# Patient Record
Sex: Female | Born: 1935 | Race: White | Hispanic: No | Smoking: Never smoker
Health system: Southern US, Community
[De-identification: ages and names within clinical notes are randomized; demographics above are authoritative.]

## PROBLEM LIST (undated history)

## (undated) DIAGNOSIS — F329 Major depressive disorder, single episode, unspecified: Secondary | ICD-10-CM

## (undated) DIAGNOSIS — E785 Hyperlipidemia, unspecified: Secondary | ICD-10-CM

## (undated) DIAGNOSIS — R001 Bradycardia, unspecified: Secondary | ICD-10-CM

## (undated) DIAGNOSIS — R42 Dizziness and giddiness: Secondary | ICD-10-CM

## (undated) DIAGNOSIS — Z853 Personal history of malignant neoplasm of breast: Secondary | ICD-10-CM

## (undated) DIAGNOSIS — I639 Cerebral infarction, unspecified: Secondary | ICD-10-CM

## (undated) DIAGNOSIS — Z9889 Other specified postprocedural states: Secondary | ICD-10-CM

## (undated) DIAGNOSIS — Z8489 Family history of other specified conditions: Secondary | ICD-10-CM

## (undated) DIAGNOSIS — E871 Hypo-osmolality and hyponatremia: Secondary | ICD-10-CM

## (undated) DIAGNOSIS — I471 Supraventricular tachycardia: Secondary | ICD-10-CM

## (undated) DIAGNOSIS — I495 Sick sinus syndrome: Secondary | ICD-10-CM

## (undated) DIAGNOSIS — R0602 Shortness of breath: Secondary | ICD-10-CM

## (undated) DIAGNOSIS — R011 Cardiac murmur, unspecified: Secondary | ICD-10-CM

## (undated) DIAGNOSIS — C801 Malignant (primary) neoplasm, unspecified: Secondary | ICD-10-CM

## (undated) DIAGNOSIS — E049 Nontoxic goiter, unspecified: Secondary | ICD-10-CM

## (undated) DIAGNOSIS — I4719 Other supraventricular tachycardia: Secondary | ICD-10-CM

## (undated) DIAGNOSIS — F32A Depression, unspecified: Secondary | ICD-10-CM

## (undated) DIAGNOSIS — G250 Essential tremor: Secondary | ICD-10-CM

## (undated) DIAGNOSIS — K625 Hemorrhage of anus and rectum: Secondary | ICD-10-CM

## (undated) DIAGNOSIS — C50919 Malignant neoplasm of unspecified site of unspecified female breast: Secondary | ICD-10-CM

## (undated) DIAGNOSIS — Z950 Presence of cardiac pacemaker: Secondary | ICD-10-CM

## (undated) DIAGNOSIS — M1990 Unspecified osteoarthritis, unspecified site: Secondary | ICD-10-CM

## (undated) DIAGNOSIS — Z780 Asymptomatic menopausal state: Secondary | ICD-10-CM

## (undated) DIAGNOSIS — R112 Nausea with vomiting, unspecified: Secondary | ICD-10-CM

## (undated) DIAGNOSIS — I10 Essential (primary) hypertension: Secondary | ICD-10-CM

## (undated) HISTORY — DX: Essential (primary) hypertension: I10

## (undated) HISTORY — DX: Malignant (primary) neoplasm, unspecified: C80.1

## (undated) HISTORY — DX: Dizziness and giddiness: R42

## (undated) HISTORY — DX: Other supraventricular tachycardia: I47.19

## (undated) HISTORY — DX: Personal history of malignant neoplasm of breast: Z85.3

## (undated) HISTORY — DX: Sick sinus syndrome: I49.5

## (undated) HISTORY — DX: Malignant neoplasm of unspecified site of unspecified female breast: C50.919

## (undated) HISTORY — DX: Cerebral infarction, unspecified: I63.9

## (undated) HISTORY — PX: THYROID SURGERY: SHX805

## (undated) HISTORY — DX: Nontoxic goiter, unspecified: E04.9

## (undated) HISTORY — DX: Asymptomatic menopausal state: Z78.0

## (undated) HISTORY — DX: Unspecified osteoarthritis, unspecified site: M19.90

## (undated) HISTORY — PX: OTHER SURGICAL HISTORY: SHX169

## (undated) HISTORY — PX: GALLBLADDER SURGERY: SHX652

## (undated) HISTORY — DX: Hyperlipidemia, unspecified: E78.5

## (undated) HISTORY — DX: Hypo-osmolality and hyponatremia: E87.1

## (undated) HISTORY — DX: Supraventricular tachycardia: I47.1

## (undated) HISTORY — DX: Hemorrhage of anus and rectum: K62.5

## (undated) HISTORY — PX: CHOLECYSTECTOMY: SHX55

## (undated) HISTORY — PX: JOINT REPLACEMENT: SHX530

---

## 2003-08-31 ENCOUNTER — Encounter: Payer: Self-pay | Admitting: Cardiology

## 2003-08-31 ENCOUNTER — Inpatient Hospital Stay (HOSPITAL_COMMUNITY): Admission: RE | Admit: 2003-08-31 | Discharge: 2003-08-31 | Payer: Self-pay | Admitting: Orthopedic Surgery

## 2003-09-10 ENCOUNTER — Inpatient Hospital Stay (HOSPITAL_BASED_OUTPATIENT_CLINIC_OR_DEPARTMENT_OTHER): Admission: RE | Admit: 2003-09-10 | Discharge: 2003-09-10 | Payer: Self-pay | Admitting: Cardiology

## 2005-03-09 ENCOUNTER — Ambulatory Visit: Payer: Self-pay | Admitting: Critical Care Medicine

## 2006-03-12 HISTORY — PX: TOTAL KNEE ARTHROPLASTY: SHX125

## 2006-03-12 HISTORY — PX: MASTECTOMY: SHX3

## 2006-08-06 ENCOUNTER — Ambulatory Visit: Payer: Self-pay | Admitting: Cardiology

## 2006-08-14 ENCOUNTER — Ambulatory Visit (HOSPITAL_COMMUNITY): Admission: RE | Admit: 2006-08-14 | Discharge: 2006-08-14 | Payer: Self-pay | Admitting: Family Medicine

## 2006-08-14 ENCOUNTER — Encounter (INDEPENDENT_AMBULATORY_CARE_PROVIDER_SITE_OTHER): Payer: Self-pay | Admitting: Family Medicine

## 2006-10-08 ENCOUNTER — Ambulatory Visit: Payer: Self-pay | Admitting: Oncology

## 2006-10-08 ENCOUNTER — Encounter (INDEPENDENT_AMBULATORY_CARE_PROVIDER_SITE_OTHER): Payer: Self-pay | Admitting: Diagnostic Radiology

## 2006-10-08 ENCOUNTER — Encounter: Admission: RE | Admit: 2006-10-08 | Discharge: 2006-10-08 | Payer: Self-pay | Admitting: Surgery

## 2006-10-08 ENCOUNTER — Encounter (INDEPENDENT_AMBULATORY_CARE_PROVIDER_SITE_OTHER): Payer: Self-pay | Admitting: Surgery

## 2006-10-15 LAB — CBC WITH DIFFERENTIAL/PLATELET
Basophils Absolute: 0 10*3/uL (ref 0.0–0.1)
Eosinophils Absolute: 0.1 10*3/uL (ref 0.0–0.5)
HCT: 37.1 % (ref 34.8–46.6)
LYMPH%: 31.9 % (ref 14.0–48.0)
MCV: 90.4 fL (ref 81.0–101.0)
MONO#: 0.5 10*3/uL (ref 0.1–0.9)
NEUT#: 3.1 10*3/uL (ref 1.5–6.5)
NEUT%: 56.7 % (ref 39.6–76.8)
Platelets: 249 10*3/uL (ref 145–400)
WBC: 5.5 10*3/uL (ref 3.9–10.0)

## 2006-10-15 LAB — LACTATE DEHYDROGENASE: LDH: 152 U/L (ref 94–250)

## 2006-10-15 LAB — COMPREHENSIVE METABOLIC PANEL
BUN: 22 mg/dL (ref 6–23)
CO2: 24 mEq/L (ref 19–32)
Creatinine, Ser: 0.95 mg/dL (ref 0.40–1.20)
Glucose, Bld: 117 mg/dL — ABNORMAL HIGH (ref 70–99)
Total Bilirubin: 0.7 mg/dL (ref 0.3–1.2)
Total Protein: 6.8 g/dL (ref 6.0–8.3)

## 2006-10-15 LAB — CANCER ANTIGEN 27.29: CA 27.29: 16 U/mL (ref 0–39)

## 2006-10-16 ENCOUNTER — Ambulatory Visit (HOSPITAL_COMMUNITY): Admission: RE | Admit: 2006-10-16 | Discharge: 2006-10-16 | Payer: Self-pay | Admitting: Oncology

## 2006-10-16 ENCOUNTER — Encounter: Admission: RE | Admit: 2006-10-16 | Discharge: 2006-10-16 | Payer: Self-pay | Admitting: Surgery

## 2006-10-17 ENCOUNTER — Encounter: Admission: RE | Admit: 2006-10-17 | Discharge: 2006-10-17 | Payer: Self-pay | Admitting: Obstetrics and Gynecology

## 2006-10-21 ENCOUNTER — Ambulatory Visit (HOSPITAL_COMMUNITY): Admission: RE | Admit: 2006-10-21 | Discharge: 2006-10-21 | Payer: Self-pay | Admitting: Oncology

## 2006-10-30 ENCOUNTER — Ambulatory Visit (HOSPITAL_BASED_OUTPATIENT_CLINIC_OR_DEPARTMENT_OTHER): Admission: RE | Admit: 2006-10-30 | Discharge: 2006-10-31 | Payer: Self-pay | Admitting: Surgery

## 2006-10-31 ENCOUNTER — Encounter: Admission: RE | Admit: 2006-10-31 | Discharge: 2006-10-31 | Payer: Self-pay | Admitting: Oncology

## 2006-11-15 LAB — TECHNOLOGIST REVIEW

## 2006-11-15 LAB — CBC WITH DIFFERENTIAL/PLATELET
BASO%: 2.1 % — ABNORMAL HIGH (ref 0.0–2.0)
EOS%: 2.3 % (ref 0.0–7.0)
HCT: 36.3 % (ref 34.8–46.6)
MCHC: 35.4 g/dL (ref 32.0–36.0)
MONO#: 0.3 10*3/uL (ref 0.1–0.9)
NEUT%: 56.3 % (ref 39.6–76.8)
RBC: 4.17 10*6/uL (ref 3.70–5.32)
RDW: 10 % — ABNORMAL LOW (ref 11.3–14.5)
WBC: 3.9 10*3/uL (ref 3.9–10.0)
lymph#: 1.2 10*3/uL (ref 0.9–3.3)

## 2006-11-25 ENCOUNTER — Ambulatory Visit: Payer: Self-pay | Admitting: Oncology

## 2006-11-27 LAB — BASIC METABOLIC PANEL
BUN: 26 mg/dL — ABNORMAL HIGH (ref 6–23)
Chloride: 104 mEq/L (ref 96–112)
Glucose, Bld: 238 mg/dL — ABNORMAL HIGH (ref 70–99)
Potassium: 3.4 mEq/L — ABNORMAL LOW (ref 3.5–5.3)
Sodium: 140 mEq/L (ref 135–145)

## 2006-11-27 LAB — CBC WITH DIFFERENTIAL/PLATELET
Basophils Absolute: 0.1 10*3/uL (ref 0.0–0.1)
Eosinophils Absolute: 0 10*3/uL (ref 0.0–0.5)
HGB: 12.6 g/dL (ref 11.6–15.9)
MONO#: 0.5 10*3/uL (ref 0.1–0.9)
NEUT#: 3.3 10*3/uL (ref 1.5–6.5)
RBC: 4 10*6/uL (ref 3.70–5.32)
RDW: 12.2 % (ref 11.3–14.5)
WBC: 5 10*3/uL (ref 3.9–10.0)
lymph#: 1 10*3/uL (ref 0.9–3.3)

## 2006-12-04 LAB — CBC WITH DIFFERENTIAL/PLATELET
BASO%: 3 % — ABNORMAL HIGH (ref 0.0–2.0)
Eosinophils Absolute: 0 10*3/uL (ref 0.0–0.5)
HCT: 35.7 % (ref 34.8–46.6)
LYMPH%: 58 % — ABNORMAL HIGH (ref 14.0–48.0)
MCHC: 34.9 g/dL (ref 32.0–36.0)
MONO#: 0.1 10*3/uL (ref 0.1–0.9)
NEUT#: 0.3 10*3/uL — CL (ref 1.5–6.5)
NEUT%: 28.9 % — ABNORMAL LOW (ref 39.6–76.8)
Platelets: 165 10*3/uL (ref 145–400)
WBC: 1.2 10*3/uL — ABNORMAL LOW (ref 3.9–10.0)
lymph#: 0.7 10*3/uL — ABNORMAL LOW (ref 0.9–3.3)

## 2006-12-18 LAB — CBC WITH DIFFERENTIAL/PLATELET
Basophils Absolute: 0.1 10*3/uL (ref 0.0–0.1)
HCT: 35.6 % (ref 34.8–46.6)
HGB: 12.8 g/dL (ref 11.6–15.9)
LYMPH%: 22.4 % (ref 14.0–48.0)
MCH: 32.5 pg (ref 26.0–34.0)
MONO#: 0.7 10*3/uL (ref 0.1–0.9)
NEUT%: 61.5 % (ref 39.6–76.8)
Platelets: 258 10*3/uL (ref 145–400)
WBC: 4.8 10*3/uL (ref 3.9–10.0)
lymph#: 1.1 10*3/uL (ref 0.9–3.3)

## 2006-12-25 LAB — CBC WITH DIFFERENTIAL/PLATELET
Basophils Absolute: 0 10*3/uL (ref 0.0–0.1)
EOS%: 2 % (ref 0.0–7.0)
HCT: 31.5 % — ABNORMAL LOW (ref 34.8–46.6)
HGB: 11.3 g/dL — ABNORMAL LOW (ref 11.6–15.9)
LYMPH%: 47.6 % (ref 14.0–48.0)
MCH: 32.3 pg (ref 26.0–34.0)
MCHC: 35.8 g/dL (ref 32.0–36.0)
MCV: 90.4 fL (ref 81.0–101.0)
MONO%: 2.7 % (ref 0.0–13.0)
NEUT%: 47.6 % (ref 39.6–76.8)

## 2006-12-27 ENCOUNTER — Ambulatory Visit: Payer: Self-pay | Admitting: Oncology

## 2006-12-27 ENCOUNTER — Inpatient Hospital Stay (HOSPITAL_COMMUNITY): Admission: EM | Admit: 2006-12-27 | Discharge: 2006-12-30 | Payer: Self-pay | Admitting: Oncology

## 2006-12-27 LAB — CBC WITH DIFFERENTIAL/PLATELET
BASO%: 0.6 % (ref 0.0–2.0)
Basophils Absolute: 0 10*3/uL (ref 0.0–0.1)
EOS%: 2.3 % (ref 0.0–7.0)
HGB: 10.8 g/dL — ABNORMAL LOW (ref 11.6–15.9)
MCH: 32.3 pg (ref 26.0–34.0)
MCHC: 36 g/dL (ref 32.0–36.0)
MCV: 89.9 fL (ref 81.0–101.0)
MONO%: 11.5 % (ref 0.0–13.0)
RBC: 3.33 10*6/uL — ABNORMAL LOW (ref 3.70–5.32)
RDW: 14.7 % — ABNORMAL HIGH (ref 11.3–14.5)

## 2006-12-27 LAB — COMPREHENSIVE METABOLIC PANEL
AST: 18 U/L (ref 0–37)
Albumin: 3.4 g/dL — ABNORMAL LOW (ref 3.5–5.2)
Alkaline Phosphatase: 64 U/L (ref 39–117)
BUN: 19 mg/dL (ref 6–23)
Potassium: 3.4 mEq/L — ABNORMAL LOW (ref 3.5–5.3)

## 2007-01-06 ENCOUNTER — Ambulatory Visit: Payer: Self-pay | Admitting: Oncology

## 2007-01-08 LAB — COMPREHENSIVE METABOLIC PANEL
AST: 20 U/L (ref 0–37)
BUN: 18 mg/dL (ref 6–23)
Calcium: 9.6 mg/dL (ref 8.4–10.5)
Chloride: 98 mEq/L (ref 96–112)
Creatinine, Ser: 0.86 mg/dL (ref 0.40–1.20)

## 2007-01-08 LAB — CBC WITH DIFFERENTIAL/PLATELET
Basophils Absolute: 0.1 10*3/uL (ref 0.0–0.1)
EOS%: 0.3 % (ref 0.0–7.0)
HCT: 33.7 % — ABNORMAL LOW (ref 34.8–46.6)
HGB: 12.1 g/dL (ref 11.6–15.9)
MCH: 32.7 pg (ref 26.0–34.0)
MCV: 90.9 fL (ref 81.0–101.0)
MONO%: 16.2 % — ABNORMAL HIGH (ref 0.0–13.0)
NEUT%: 57.1 % (ref 39.6–76.8)
RDW: 13.9 % (ref 11.3–14.5)

## 2007-01-14 LAB — COMPREHENSIVE METABOLIC PANEL
BUN: 13 mg/dL (ref 6–23)
CO2: 31 mEq/L (ref 19–32)
Calcium: 9.1 mg/dL (ref 8.4–10.5)
Chloride: 94 mEq/L — ABNORMAL LOW (ref 96–112)
Creatinine, Ser: 0.95 mg/dL (ref 0.40–1.20)
Glucose, Bld: 190 mg/dL — ABNORMAL HIGH (ref 70–99)

## 2007-01-14 LAB — CBC WITH DIFFERENTIAL/PLATELET
Basophils Absolute: 0.1 10*3/uL (ref 0.0–0.1)
HCT: 32.4 % — ABNORMAL LOW (ref 34.8–46.6)
HGB: 11.7 g/dL (ref 11.6–15.9)
MCH: 32.3 pg (ref 26.0–34.0)
MONO#: 0.3 10*3/uL (ref 0.1–0.9)
NEUT%: 71.5 % (ref 39.6–76.8)
lymph#: 1.1 10*3/uL (ref 0.9–3.3)

## 2007-01-15 LAB — CBC WITH DIFFERENTIAL/PLATELET
BASO%: 0.2 % (ref 0.0–2.0)
Basophils Absolute: 0 10*3/uL (ref 0.0–0.1)
HCT: 31.7 % — ABNORMAL LOW (ref 34.8–46.6)
HGB: 11.2 g/dL — ABNORMAL LOW (ref 11.6–15.9)
MONO#: 0.5 10*3/uL (ref 0.1–0.9)
NEUT%: 83.1 % — ABNORMAL HIGH (ref 39.6–76.8)
WBC: 8.4 10*3/uL (ref 3.9–10.0)
lymph#: 0.8 10*3/uL — ABNORMAL LOW (ref 0.9–3.3)

## 2007-01-15 LAB — BASIC METABOLIC PANEL
BUN: 10 mg/dL (ref 6–23)
CO2: 34 mEq/L — ABNORMAL HIGH (ref 19–32)
Chloride: 99 mEq/L (ref 96–112)
Creatinine, Ser: 0.92 mg/dL (ref 0.40–1.20)
Potassium: 3.3 mEq/L — ABNORMAL LOW (ref 3.5–5.3)

## 2007-01-23 ENCOUNTER — Encounter: Admission: RE | Admit: 2007-01-23 | Discharge: 2007-01-23 | Payer: Self-pay | Admitting: Oncology

## 2007-01-29 LAB — CBC WITH DIFFERENTIAL/PLATELET
BASO%: 1.3 % (ref 0.0–2.0)
Basophils Absolute: 0.1 10*3/uL (ref 0.0–0.1)
EOS%: 2 % (ref 0.0–7.0)
HGB: 11.5 g/dL — ABNORMAL LOW (ref 11.6–15.9)
MCH: 33.8 pg (ref 26.0–34.0)
MONO#: 0.5 10*3/uL (ref 0.1–0.9)
RDW: 14.2 % (ref 11.3–14.5)
WBC: 4.5 10*3/uL (ref 3.9–10.0)
lymph#: 1.1 10*3/uL (ref 0.9–3.3)

## 2007-02-17 ENCOUNTER — Encounter (INDEPENDENT_AMBULATORY_CARE_PROVIDER_SITE_OTHER): Payer: Self-pay | Admitting: Surgery

## 2007-02-17 ENCOUNTER — Ambulatory Visit (HOSPITAL_COMMUNITY): Admission: RE | Admit: 2007-02-17 | Discharge: 2007-02-18 | Payer: Self-pay | Admitting: Surgery

## 2007-03-05 ENCOUNTER — Ambulatory Visit: Admission: RE | Admit: 2007-03-05 | Discharge: 2007-03-12 | Payer: Self-pay | Admitting: Radiation Oncology

## 2007-03-13 ENCOUNTER — Ambulatory Visit: Admission: RE | Admit: 2007-03-13 | Discharge: 2007-06-10 | Payer: Self-pay | Admitting: Radiation Oncology

## 2007-03-13 HISTORY — PX: BREAST SURGERY: SHX581

## 2007-03-17 ENCOUNTER — Ambulatory Visit: Payer: Self-pay | Admitting: Oncology

## 2007-03-17 LAB — COMPREHENSIVE METABOLIC PANEL
Albumin: 4 g/dL (ref 3.5–5.2)
BUN: 22 mg/dL (ref 6–23)
CO2: 27 mEq/L (ref 19–32)
Calcium: 9.6 mg/dL (ref 8.4–10.5)
Chloride: 102 mEq/L (ref 96–112)
Glucose, Bld: 228 mg/dL — ABNORMAL HIGH (ref 70–99)
Potassium: 4.2 mEq/L (ref 3.5–5.3)

## 2007-03-17 LAB — CANCER ANTIGEN 27.29: CA 27.29: 18 U/mL (ref 0–39)

## 2007-03-17 LAB — CBC WITH DIFFERENTIAL/PLATELET
Basophils Absolute: 0 10*3/uL (ref 0.0–0.1)
Eosinophils Absolute: 0.1 10*3/uL (ref 0.0–0.5)
HGB: 12.4 g/dL (ref 11.6–15.9)
NEUT#: 2.9 10*3/uL (ref 1.5–6.5)
RDW: 12.7 % (ref 11.3–14.5)
lymph#: 1 10*3/uL (ref 0.9–3.3)

## 2007-06-10 ENCOUNTER — Ambulatory Visit: Admission: RE | Admit: 2007-06-10 | Discharge: 2007-07-08 | Payer: Self-pay | Admitting: Radiation Oncology

## 2007-07-23 ENCOUNTER — Ambulatory Visit: Payer: Self-pay | Admitting: Oncology

## 2007-07-25 LAB — CBC WITH DIFFERENTIAL/PLATELET
BASO%: 0.9 % (ref 0.0–2.0)
EOS%: 2.3 % (ref 0.0–7.0)
HCT: 32.9 % — ABNORMAL LOW (ref 34.8–46.6)
LYMPH%: 26.6 % (ref 14.0–48.0)
MCH: 32.8 pg (ref 26.0–34.0)
MCHC: 35.8 g/dL (ref 32.0–36.0)
MCV: 91.8 fL (ref 81.0–101.0)
MONO%: 10.7 % (ref 0.0–13.0)
NEUT%: 59.5 % (ref 39.6–76.8)
Platelets: 224 10*3/uL (ref 145–400)
RBC: 3.59 10*6/uL — ABNORMAL LOW (ref 3.70–5.32)
WBC: 3.6 10*3/uL — ABNORMAL LOW (ref 3.9–10.0)

## 2007-07-28 LAB — COMPREHENSIVE METABOLIC PANEL
ALT: 31 U/L (ref 0–35)
Alkaline Phosphatase: 72 U/L (ref 39–117)
CO2: 25 mEq/L (ref 19–32)
Creatinine, Ser: 0.97 mg/dL (ref 0.40–1.20)
Sodium: 139 mEq/L (ref 135–145)
Total Bilirubin: 0.5 mg/dL (ref 0.3–1.2)
Total Protein: 7 g/dL (ref 6.0–8.3)

## 2007-07-29 ENCOUNTER — Encounter: Admission: RE | Admit: 2007-07-29 | Discharge: 2007-07-29 | Payer: Self-pay | Admitting: Oncology

## 2007-08-02 ENCOUNTER — Encounter: Admission: RE | Admit: 2007-08-02 | Discharge: 2007-08-02 | Payer: Self-pay | Admitting: Oncology

## 2007-09-15 ENCOUNTER — Ambulatory Visit: Payer: Self-pay | Admitting: Oncology

## 2007-09-17 LAB — CBC WITH DIFFERENTIAL/PLATELET
BASO%: 1.1 % (ref 0.0–2.0)
Eosinophils Absolute: 0.1 10*3/uL (ref 0.0–0.5)
MCHC: 36.1 g/dL — ABNORMAL HIGH (ref 32.0–36.0)
MCV: 91.8 fL (ref 81.0–101.0)
MONO%: 6.4 % (ref 0.0–13.0)
NEUT#: 3.2 10*3/uL (ref 1.5–6.5)
RBC: 3.84 10*6/uL (ref 3.70–5.32)
RDW: 12.8 % (ref 11.3–14.5)
WBC: 4.6 10*3/uL (ref 3.9–10.0)

## 2007-09-18 LAB — COMPREHENSIVE METABOLIC PANEL
ALT: 41 U/L — ABNORMAL HIGH (ref 0–35)
AST: 20 U/L (ref 0–37)
Albumin: 4 g/dL (ref 3.5–5.2)
Alkaline Phosphatase: 75 U/L (ref 39–117)
Glucose, Bld: 148 mg/dL — ABNORMAL HIGH (ref 70–99)
Potassium: 3.8 mEq/L (ref 3.5–5.3)
Sodium: 139 mEq/L (ref 135–145)
Total Bilirubin: 0.5 mg/dL (ref 0.3–1.2)
Total Protein: 6.5 g/dL (ref 6.0–8.3)

## 2007-11-24 ENCOUNTER — Ambulatory Visit: Payer: Self-pay | Admitting: Oncology

## 2007-12-06 ENCOUNTER — Emergency Department (HOSPITAL_BASED_OUTPATIENT_CLINIC_OR_DEPARTMENT_OTHER): Admission: EM | Admit: 2007-12-06 | Discharge: 2007-12-06 | Payer: Self-pay | Admitting: Emergency Medicine

## 2008-02-04 ENCOUNTER — Ambulatory Visit: Payer: Self-pay | Admitting: Oncology

## 2008-02-09 LAB — CBC WITH DIFFERENTIAL/PLATELET
BASO%: 0.4 % (ref 0.0–2.0)
Eosinophils Absolute: 0.1 10*3/uL (ref 0.0–0.5)
HCT: 36 % (ref 34.8–46.6)
LYMPH%: 26.3 % (ref 14.0–48.0)
MCHC: 35.4 g/dL (ref 32.0–36.0)
MONO#: 0.4 10*3/uL (ref 0.1–0.9)
NEUT%: 62.1 % (ref 39.6–76.8)
Platelets: 223 10*3/uL (ref 145–400)
WBC: 5 10*3/uL (ref 3.9–10.0)

## 2008-02-10 LAB — CANCER ANTIGEN 27.29: CA 27.29: 18 U/mL (ref 0–39)

## 2008-02-10 LAB — COMPREHENSIVE METABOLIC PANEL
BUN: 23 mg/dL (ref 6–23)
CO2: 28 mEq/L (ref 19–32)
Creatinine, Ser: 0.88 mg/dL (ref 0.40–1.20)
Glucose, Bld: 180 mg/dL — ABNORMAL HIGH (ref 70–99)
Total Bilirubin: 0.5 mg/dL (ref 0.3–1.2)

## 2008-02-10 LAB — VITAMIN D 25 HYDROXY (VIT D DEFICIENCY, FRACTURES): Vit D, 25-Hydroxy: 31 ng/mL (ref 30–89)

## 2008-02-10 LAB — LACTATE DEHYDROGENASE: LDH: 140 U/L (ref 94–250)

## 2008-02-25 ENCOUNTER — Encounter: Admission: RE | Admit: 2008-02-25 | Discharge: 2008-02-25 | Payer: Self-pay | Admitting: Oncology

## 2008-02-26 ENCOUNTER — Ambulatory Visit (HOSPITAL_BASED_OUTPATIENT_CLINIC_OR_DEPARTMENT_OTHER): Admission: RE | Admit: 2008-02-26 | Discharge: 2008-02-26 | Payer: Self-pay | Admitting: Surgery

## 2008-08-18 ENCOUNTER — Ambulatory Visit: Payer: Self-pay | Admitting: Oncology

## 2008-08-19 LAB — CBC WITH DIFFERENTIAL/PLATELET
BASO%: 0.3 % (ref 0.0–2.0)
EOS%: 3.4 % (ref 0.0–7.0)
MCH: 33.6 pg (ref 25.1–34.0)
MCHC: 36.2 g/dL — ABNORMAL HIGH (ref 31.5–36.0)
MONO#: 0.2 10*3/uL (ref 0.1–0.9)
RBC: 3.93 10*6/uL (ref 3.70–5.45)
RDW: 12.6 % (ref 11.2–14.5)
WBC: 4 10*3/uL (ref 3.9–10.3)
lymph#: 0.8 10*3/uL — ABNORMAL LOW (ref 0.9–3.3)

## 2008-08-20 LAB — COMPREHENSIVE METABOLIC PANEL
ALT: 130 U/L — ABNORMAL HIGH (ref 0–35)
AST: 131 U/L — ABNORMAL HIGH (ref 0–37)
CO2: 30 mEq/L (ref 19–32)
Calcium: 9.9 mg/dL (ref 8.4–10.5)
Chloride: 97 mEq/L (ref 96–112)
Sodium: 140 mEq/L (ref 135–145)
Total Protein: 6.7 g/dL (ref 6.0–8.3)

## 2008-08-20 LAB — LACTATE DEHYDROGENASE: LDH: 250 U/L (ref 94–250)

## 2008-08-27 LAB — HEPATIC FUNCTION PANEL
AST: 17 U/L (ref 0–37)
Bilirubin, Direct: 0.1 mg/dL (ref 0.0–0.3)
Total Bilirubin: 0.6 mg/dL (ref 0.3–1.2)

## 2008-08-27 LAB — GAMMA GT: GGT: 100 U/L — ABNORMAL HIGH (ref 7–51)

## 2009-02-22 ENCOUNTER — Ambulatory Visit: Payer: Self-pay | Admitting: Oncology

## 2009-02-28 ENCOUNTER — Encounter: Admission: RE | Admit: 2009-02-28 | Discharge: 2009-02-28 | Payer: Self-pay | Admitting: Obstetrics and Gynecology

## 2009-03-03 LAB — COMPREHENSIVE METABOLIC PANEL
AST: 19 U/L (ref 0–37)
BUN: 18 mg/dL (ref 6–23)
CO2: 29 mEq/L (ref 19–32)
Calcium: 10 mg/dL (ref 8.4–10.5)
Chloride: 97 mEq/L (ref 96–112)
Creatinine, Ser: 1.02 mg/dL (ref 0.40–1.20)
Glucose, Bld: 279 mg/dL — ABNORMAL HIGH (ref 70–99)

## 2009-03-03 LAB — CBC WITH DIFFERENTIAL/PLATELET
Basophils Absolute: 0 10*3/uL (ref 0.0–0.1)
EOS%: 2.8 % (ref 0.0–7.0)
Eosinophils Absolute: 0.1 10*3/uL (ref 0.0–0.5)
HCT: 37.7 % (ref 34.8–46.6)
HGB: 13.4 g/dL (ref 11.6–15.9)
MCH: 33.4 pg (ref 25.1–34.0)
NEUT%: 68 % (ref 38.4–76.8)
lymph#: 1 10*3/uL (ref 0.9–3.3)

## 2009-03-03 LAB — VITAMIN D 25 HYDROXY (VIT D DEFICIENCY, FRACTURES): Vit D, 25-Hydroxy: 31 ng/mL (ref 30–89)

## 2009-03-03 LAB — LACTATE DEHYDROGENASE: LDH: 143 U/L (ref 94–250)

## 2009-03-03 LAB — CANCER ANTIGEN 27.29: CA 27.29: 15 U/mL (ref 0–39)

## 2009-03-12 HISTORY — PX: BRAIN SURGERY: SHX531

## 2009-03-13 ENCOUNTER — Ambulatory Visit: Payer: Self-pay | Admitting: Diagnostic Radiology

## 2009-03-13 ENCOUNTER — Ambulatory Visit: Payer: Self-pay | Admitting: Cardiovascular Disease

## 2009-03-13 ENCOUNTER — Encounter (HOSPITAL_COMMUNITY): Payer: Self-pay | Admitting: Emergency Medicine

## 2009-03-13 ENCOUNTER — Inpatient Hospital Stay (HOSPITAL_COMMUNITY): Admission: EM | Admit: 2009-03-13 | Discharge: 2009-03-17 | Payer: Self-pay | Admitting: Internal Medicine

## 2009-03-15 ENCOUNTER — Encounter (INDEPENDENT_AMBULATORY_CARE_PROVIDER_SITE_OTHER): Payer: Self-pay | Admitting: Internal Medicine

## 2009-03-22 ENCOUNTER — Inpatient Hospital Stay (HOSPITAL_COMMUNITY): Admission: RE | Admit: 2009-03-22 | Discharge: 2009-03-23 | Payer: Self-pay | Admitting: Interventional Radiology

## 2009-04-05 ENCOUNTER — Encounter: Payer: Self-pay | Admitting: Interventional Radiology

## 2009-05-03 ENCOUNTER — Encounter: Admission: RE | Admit: 2009-05-03 | Discharge: 2009-08-01 | Payer: Self-pay | Admitting: Neurology

## 2009-08-23 ENCOUNTER — Ambulatory Visit: Payer: Self-pay | Admitting: Oncology

## 2009-08-30 LAB — COMPREHENSIVE METABOLIC PANEL
ALT: 55 U/L — ABNORMAL HIGH (ref 0–35)
Albumin: 3.8 g/dL (ref 3.5–5.2)
CO2: 32 mEq/L (ref 19–32)
Calcium: 9.2 mg/dL (ref 8.4–10.5)
Chloride: 100 mEq/L (ref 96–112)
Glucose, Bld: 157 mg/dL — ABNORMAL HIGH (ref 70–99)
Potassium: 3.2 mEq/L — ABNORMAL LOW (ref 3.5–5.3)
Sodium: 139 mEq/L (ref 135–145)
Total Bilirubin: 0.8 mg/dL (ref 0.3–1.2)
Total Protein: 6.7 g/dL (ref 6.0–8.3)

## 2009-08-30 LAB — CBC WITH DIFFERENTIAL/PLATELET
BASO%: 0.4 % (ref 0.0–2.0)
Eosinophils Absolute: 0.2 10*3/uL (ref 0.0–0.5)
LYMPH%: 23.4 % (ref 14.0–49.7)
MCHC: 35.2 g/dL (ref 31.5–36.0)
MONO#: 0.5 10*3/uL (ref 0.1–0.9)
NEUT#: 3 10*3/uL (ref 1.5–6.5)
Platelets: 198 10*3/uL (ref 145–400)
RBC: 3.84 10*6/uL (ref 3.70–5.45)
WBC: 4.9 10*3/uL (ref 3.9–10.3)
lymph#: 1.1 10*3/uL (ref 0.9–3.3)

## 2009-08-30 LAB — CANCER ANTIGEN 27.29: CA 27.29: 18 U/mL (ref 0–39)

## 2009-08-30 LAB — LACTATE DEHYDROGENASE: LDH: 139 U/L (ref 94–250)

## 2009-09-21 ENCOUNTER — Encounter: Payer: Self-pay | Admitting: Interventional Radiology

## 2009-09-27 ENCOUNTER — Ambulatory Visit (HOSPITAL_COMMUNITY): Admission: RE | Admit: 2009-09-27 | Discharge: 2009-09-27 | Payer: Self-pay | Admitting: Interventional Radiology

## 2009-09-29 ENCOUNTER — Encounter: Admission: RE | Admit: 2009-09-29 | Discharge: 2009-09-29 | Payer: Self-pay | Admitting: Neurology

## 2009-12-08 ENCOUNTER — Encounter: Admission: RE | Admit: 2009-12-08 | Discharge: 2009-12-08 | Payer: Self-pay | Admitting: Family Medicine

## 2010-03-01 ENCOUNTER — Encounter
Admission: RE | Admit: 2010-03-01 | Discharge: 2010-03-01 | Payer: Self-pay | Source: Home / Self Care | Attending: Oncology | Admitting: Oncology

## 2010-03-24 ENCOUNTER — Ambulatory Visit: Payer: Self-pay | Admitting: Oncology

## 2010-04-02 ENCOUNTER — Encounter: Payer: Self-pay | Admitting: Surgery

## 2010-04-02 ENCOUNTER — Encounter: Payer: Self-pay | Admitting: Oncology

## 2010-04-10 LAB — CBC WITH DIFFERENTIAL/PLATELET
BASO%: 0.4 % (ref 0.0–2.0)
Basophils Absolute: 0 10*3/uL (ref 0.0–0.1)
EOS%: 3.2 % (ref 0.0–7.0)
HCT: 37 % (ref 34.8–46.6)
HGB: 13 g/dL (ref 11.6–15.9)
LYMPH%: 22.7 % (ref 14.0–49.7)
MCH: 32.5 pg (ref 25.1–34.0)
MCHC: 35.2 g/dL (ref 31.5–36.0)
NEUT%: 67.6 % (ref 38.4–76.8)
Platelets: 186 10*3/uL (ref 145–400)

## 2010-04-11 LAB — COMPREHENSIVE METABOLIC PANEL
ALT: 94 U/L — ABNORMAL HIGH (ref 0–35)
AST: 60 U/L — ABNORMAL HIGH (ref 0–37)
BUN: 20 mg/dL (ref 6–23)
CO2: 23 mEq/L (ref 19–32)
Calcium: 10 mg/dL (ref 8.4–10.5)
Creatinine, Ser: 0.97 mg/dL (ref 0.40–1.20)
Total Bilirubin: 0.5 mg/dL (ref 0.3–1.2)

## 2010-04-11 LAB — VITAMIN D 25 HYDROXY (VIT D DEFICIENCY, FRACTURES): Vit D, 25-Hydroxy: 62 ng/mL (ref 30–89)

## 2010-04-11 LAB — LACTATE DEHYDROGENASE: LDH: 168 U/L (ref 94–250)

## 2010-04-17 ENCOUNTER — Other Ambulatory Visit: Payer: Self-pay | Admitting: Oncology

## 2010-04-17 ENCOUNTER — Encounter (HOSPITAL_BASED_OUTPATIENT_CLINIC_OR_DEPARTMENT_OTHER): Payer: Medicare Other | Admitting: Oncology

## 2010-04-17 DIAGNOSIS — C50419 Malignant neoplasm of upper-outer quadrant of unspecified female breast: Secondary | ICD-10-CM

## 2010-04-17 DIAGNOSIS — Z9011 Acquired absence of right breast and nipple: Secondary | ICD-10-CM

## 2010-04-17 DIAGNOSIS — Z170 Estrogen receptor positive status [ER+]: Secondary | ICD-10-CM

## 2010-05-27 LAB — PROTIME-INR
INR: 0.88 (ref 0.00–1.49)
Prothrombin Time: 11.9 seconds (ref 11.6–15.2)

## 2010-05-27 LAB — BASIC METABOLIC PANEL
BUN: 17 mg/dL (ref 6–23)
CO2: 26 mEq/L (ref 19–32)
Calcium: 9.7 mg/dL (ref 8.4–10.5)
Creatinine, Ser: 0.86 mg/dL (ref 0.4–1.2)
GFR calc non Af Amer: >60 mL/min (ref >60)
Glucose, Bld: 179 mg/dL — ABNORMAL HIGH (ref 70–99)
Sodium: 133 mEq/L — ABNORMAL LOW (ref 135–145)

## 2010-05-27 LAB — CBC
Hemoglobin: 12.9 g/dL (ref 12.0–15.0)
MCH: 33.4 pg (ref 26.0–34.0)
MCHC: 34.6 g/dL (ref 30.0–36.0)
RDW: 12.6 % (ref 11.5–15.5)

## 2010-05-27 LAB — GLUCOSE, CAPILLARY: Glucose-Capillary: 285 mg/dL — ABNORMAL HIGH (ref 70–99)

## 2010-05-28 LAB — GLUCOSE, CAPILLARY
Glucose-Capillary: 134 mg/dL — ABNORMAL HIGH (ref 70–99)
Glucose-Capillary: 146 mg/dL — ABNORMAL HIGH (ref 70–99)
Glucose-Capillary: 148 mg/dL — ABNORMAL HIGH (ref 70–99)
Glucose-Capillary: 158 mg/dL — ABNORMAL HIGH (ref 70–99)
Glucose-Capillary: 168 mg/dL — ABNORMAL HIGH (ref 70–99)
Glucose-Capillary: 171 mg/dL — ABNORMAL HIGH (ref 70–99)
Glucose-Capillary: 176 mg/dL — ABNORMAL HIGH (ref 70–99)
Glucose-Capillary: 181 mg/dL — ABNORMAL HIGH (ref 70–99)
Glucose-Capillary: 248 mg/dL — ABNORMAL HIGH (ref 70–99)
Glucose-Capillary: 267 mg/dL — ABNORMAL HIGH (ref 70–99)

## 2010-05-28 LAB — CBC
HCT: 31.6 % — ABNORMAL LOW (ref 36.0–46.0)
HCT: 33.2 % — ABNORMAL LOW (ref 36.0–46.0)
HCT: 37.4 % (ref 36.0–46.0)
HCT: 37.2 % (ref 36.0–46.0)
Hemoglobin: 11.8 g/dL — ABNORMAL LOW (ref 12.0–15.0)
Hemoglobin: 12 g/dL (ref 12.0–15.0)
Hemoglobin: 12.2 g/dL (ref 12.0–15.0)
Hemoglobin: 13.2 g/dL (ref 12.0–15.0)
Hemoglobin: 13.3 g/dL (ref 12.0–15.0)
MCHC: 35.4 g/dL (ref 30.0–36.0)
MCHC: 35.6 g/dL (ref 30.0–36.0)
MCV: 94.3 fL (ref 78.0–100.0)
MCV: 95.9 fL (ref 78.0–100.0)
MCV: 93.5 fL (ref 78.0–100.0)
Platelets: 129 10*3/uL — ABNORMAL LOW (ref 150–400)
Platelets: 151 10*3/uL (ref 150–400)
Platelets: 192 10*3/uL (ref 150–400)
Platelets: 209 10*3/uL (ref 150–400)
RBC: 3.46 MIL/uL — ABNORMAL LOW (ref 3.87–5.11)
RBC: 3.63 MIL/uL — ABNORMAL LOW (ref 3.87–5.11)
RBC: 3.93 MIL/uL (ref 3.87–5.11)
RBC: 3.98 MIL/uL (ref 3.87–5.11)
RDW: 12.6 % (ref 11.5–15.5)
RDW: 12.7 % (ref 11.5–15.5)
RDW: 12.9 % (ref 11.5–15.5)
WBC: 4.7 10*3/uL (ref 4.0–10.5)
WBC: 4.7 10*3/uL (ref 4.0–10.5)
WBC: 6.8 10*3/uL (ref 4.0–10.5)
WBC: 5.6 10*3/uL (ref 4.0–10.5)

## 2010-05-28 LAB — COMPREHENSIVE METABOLIC PANEL
ALT: 398 U/L — ABNORMAL HIGH (ref 0–35)
AST: 31 U/L (ref 0–37)
Albumin: 3.1 g/dL — ABNORMAL LOW (ref 3.5–5.2)
Albumin: 3.8 g/dL (ref 3.5–5.2)
Alkaline Phosphatase: 85 U/L (ref 39–117)
BUN: 13 mg/dL (ref 6–23)
BUN: 17 mg/dL (ref 6–23)
CO2: 31 mEq/L (ref 19–32)
Calcium: 9 mg/dL (ref 8.4–10.5)
Chloride: 95 mEq/L — ABNORMAL LOW (ref 96–112)
Glucose, Bld: 187 mg/dL — ABNORMAL HIGH (ref 70–99)
Potassium: 3.4 mEq/L — ABNORMAL LOW (ref 3.5–5.1)
Potassium: 4.6 mEq/L (ref 3.5–5.1)
Sodium: 136 mEq/L (ref 135–145)
Total Bilirubin: 0.8 mg/dL (ref 0.3–1.2)
Total Protein: 6.2 g/dL (ref 6.0–8.3)

## 2010-05-28 LAB — COMPREHENSIVE METABOLIC PANEL WITH GFR
ALT: 52 U/L — ABNORMAL HIGH (ref 0–35)
AST: 36 U/L (ref 0–37)
Albumin: 3.1 g/dL — ABNORMAL LOW (ref 3.5–5.2)
Alkaline Phosphatase: 68 U/L (ref 39–117)
BUN: 12 mg/dL (ref 6–23)
CO2: 28 meq/L (ref 19–32)
Calcium: 8.8 mg/dL (ref 8.4–10.5)
Chloride: 95 meq/L — ABNORMAL LOW (ref 96–112)
Creatinine, Ser: 0.87 mg/dL (ref 0.4–1.2)
GFR calc non Af Amer: >60 mL/min
Glucose, Bld: 151 mg/dL — ABNORMAL HIGH (ref 70–99)
Potassium: 4.2 meq/L (ref 3.5–5.1)
Sodium: 130 meq/L — ABNORMAL LOW (ref 135–145)
Total Bilirubin: 0.6 mg/dL (ref 0.3–1.2)
Total Protein: 5.8 g/dL — ABNORMAL LOW (ref 6.0–8.3)

## 2010-05-28 LAB — DIFFERENTIAL
Basophils Relative: 0 % (ref 0–1)
Basophils Relative: 3 % — ABNORMAL HIGH (ref 0–1)
Eosinophils Absolute: 0.2 10*3/uL (ref 0.0–0.7)
Eosinophils Absolute: 0 10*3/uL (ref 0.0–0.7)
Lymphs Abs: 0.5 10*3/uL — ABNORMAL LOW (ref 0.7–4.0)
Monocytes Absolute: 0.5 10*3/uL (ref 0.1–1.0)
Monocytes Absolute: 0.5 10*3/uL (ref 0.1–1.0)
Monocytes Relative: 8 % (ref 3–12)
Monocytes Relative: 8 % (ref 3–12)
Neutrophils Relative %: 72 % (ref 43–77)

## 2010-05-28 LAB — LIPID PANEL
Cholesterol: 190 mg/dL (ref 0–200)
LDL Cholesterol: 127 mg/dL — ABNORMAL HIGH (ref 0–99)
Triglycerides: 157 mg/dL — ABNORMAL HIGH (ref <150)

## 2010-05-28 LAB — APTT: aPTT: 34 seconds (ref 24–37)

## 2010-05-28 LAB — BASIC METABOLIC PANEL
BUN: 12 mg/dL (ref 6–23)
BUN: 13 mg/dL (ref 6–23)
Calcium: 9.4 mg/dL (ref 8.4–10.5)
Chloride: 95 mEq/L — ABNORMAL LOW (ref 96–112)
Chloride: 98 mEq/L (ref 96–112)
GFR calc Af Amer: >60 mL/min (ref >60)
GFR calc non Af Amer: >60 mL/min (ref >60)
Glucose, Bld: 127 mg/dL — ABNORMAL HIGH (ref 70–99)
Glucose, Bld: 151 mg/dL — ABNORMAL HIGH (ref 70–99)
Potassium: 3.3 mEq/L — ABNORMAL LOW (ref 3.5–5.1)
Potassium: 4.2 mEq/L (ref 3.5–5.1)
Sodium: 138 mEq/L (ref 135–145)

## 2010-05-28 LAB — PROTIME-INR
INR: 0.87 (ref 0.00–1.49)
INR: 1.01 (ref 0.00–1.49)
Prothrombin Time: 11.8 seconds (ref 11.6–15.2)
Prothrombin Time: 13.2 s (ref 11.6–15.2)

## 2010-05-28 LAB — HEMOGLOBIN A1C: Hgb A1c MFr Bld: 7.4 % — ABNORMAL HIGH (ref 4.6–6.1)

## 2010-05-28 LAB — BASIC METABOLIC PANEL WITH GFR
BUN: 19 mg/dL (ref 6–23)
CO2: 29 meq/L (ref 19–32)
Calcium: 9.2 mg/dL (ref 8.4–10.5)
Chloride: 96 meq/L (ref 96–112)
Creatinine, Ser: 0.7 mg/dL (ref 0.4–1.2)
GFR calc non Af Amer: >60 mL/min
Glucose, Bld: 250 mg/dL — ABNORMAL HIGH (ref 70–99)
Potassium: 4 meq/L (ref 3.5–5.1)
Sodium: 135 meq/L (ref 135–145)

## 2010-05-28 LAB — T4, FREE: Free T4: 1.31 ng/dL (ref 0.80–1.80)

## 2010-05-28 LAB — TSH: TSH: 2.692 u[IU]/mL (ref 0.350–4.500)

## 2010-07-19 ENCOUNTER — Encounter (INDEPENDENT_AMBULATORY_CARE_PROVIDER_SITE_OTHER): Payer: Self-pay | Admitting: Surgery

## 2010-07-19 DIAGNOSIS — Z973 Presence of spectacles and contact lenses: Secondary | ICD-10-CM | POA: Insufficient documentation

## 2010-07-19 DIAGNOSIS — Z972 Presence of dental prosthetic device (complete) (partial): Secondary | ICD-10-CM

## 2010-07-21 ENCOUNTER — Emergency Department (HOSPITAL_COMMUNITY): Payer: Medicare Other

## 2010-07-21 ENCOUNTER — Inpatient Hospital Stay (HOSPITAL_COMMUNITY)
Admission: EM | Admit: 2010-07-21 | Discharge: 2010-07-24 | DRG: 392 | Disposition: A | Discharge status: Home Health | Payer: Medicare Other | Attending: Internal Medicine | Admitting: Internal Medicine

## 2010-07-21 DIAGNOSIS — G252 Other specified forms of tremor: Secondary | ICD-10-CM | POA: Diagnosis present

## 2010-07-21 DIAGNOSIS — E871 Hypo-osmolality and hyponatremia: Secondary | ICD-10-CM | POA: Diagnosis present

## 2010-07-21 DIAGNOSIS — Z96659 Presence of unspecified artificial knee joint: Secondary | ICD-10-CM

## 2010-07-21 DIAGNOSIS — R112 Nausea with vomiting, unspecified: Principal | ICD-10-CM | POA: Diagnosis present

## 2010-07-21 DIAGNOSIS — T451X5A Adverse effect of antineoplastic and immunosuppressive drugs, initial encounter: Secondary | ICD-10-CM | POA: Diagnosis present

## 2010-07-21 DIAGNOSIS — R197 Diarrhea, unspecified: Secondary | ICD-10-CM | POA: Diagnosis present

## 2010-07-21 DIAGNOSIS — R7989 Other specified abnormal findings of blood chemistry: Secondary | ICD-10-CM | POA: Diagnosis present

## 2010-07-21 DIAGNOSIS — Z79899 Other long term (current) drug therapy: Secondary | ICD-10-CM

## 2010-07-21 DIAGNOSIS — I447 Left bundle-branch block, unspecified: Secondary | ICD-10-CM | POA: Diagnosis present

## 2010-07-21 DIAGNOSIS — Z8673 Personal history of transient ischemic attack (TIA), and cerebral infarction without residual deficits: Secondary | ICD-10-CM

## 2010-07-21 DIAGNOSIS — C50919 Malignant neoplasm of unspecified site of unspecified female breast: Secondary | ICD-10-CM | POA: Diagnosis present

## 2010-07-21 DIAGNOSIS — Z7982 Long term (current) use of aspirin: Secondary | ICD-10-CM

## 2010-07-21 DIAGNOSIS — R42 Dizziness and giddiness: Secondary | ICD-10-CM | POA: Diagnosis present

## 2010-07-21 DIAGNOSIS — I10 Essential (primary) hypertension: Secondary | ICD-10-CM | POA: Diagnosis present

## 2010-07-21 DIAGNOSIS — E785 Hyperlipidemia, unspecified: Secondary | ICD-10-CM | POA: Diagnosis present

## 2010-07-21 DIAGNOSIS — E119 Type 2 diabetes mellitus without complications: Secondary | ICD-10-CM | POA: Diagnosis present

## 2010-07-21 DIAGNOSIS — G250 Essential tremor: Secondary | ICD-10-CM | POA: Diagnosis present

## 2010-07-21 DIAGNOSIS — F3289 Other specified depressive episodes: Secondary | ICD-10-CM | POA: Diagnosis present

## 2010-07-21 DIAGNOSIS — F329 Major depressive disorder, single episode, unspecified: Secondary | ICD-10-CM | POA: Diagnosis present

## 2010-07-21 DIAGNOSIS — Z794 Long term (current) use of insulin: Secondary | ICD-10-CM

## 2010-07-21 DIAGNOSIS — Z7902 Long term (current) use of antithrombotics/antiplatelets: Secondary | ICD-10-CM

## 2010-07-21 LAB — URINALYSIS, ROUTINE W REFLEX MICROSCOPIC
Glucose, UA: 100 mg/dL — AB
Nitrite: NEGATIVE
Specific Gravity, Urine: 1.015 (ref 1.005–1.030)
pH: 8 (ref 5.0–8.0)

## 2010-07-21 LAB — CBC
Hemoglobin: 14 g/dL (ref 12.0–15.0)
MCH: 32.5 pg (ref 26.0–34.0)
Platelets: 204 10*3/uL (ref 150–400)
RBC: 4.31 MIL/uL (ref 3.87–5.11)
WBC: 11.5 10*3/uL — ABNORMAL HIGH (ref 4.0–10.5)

## 2010-07-21 LAB — DIFFERENTIAL
Basophils Relative: 0 % (ref 0–1)
Eosinophils Absolute: 0 10*3/uL (ref 0.0–0.7)
Lymphs Abs: 0.4 10*3/uL — ABNORMAL LOW (ref 0.7–4.0)
Monocytes Relative: 7 % (ref 3–12)
Neutro Abs: 10.3 10*3/uL — ABNORMAL HIGH (ref 1.7–7.7)
Neutrophils Relative %: 90 % — ABNORMAL HIGH (ref 43–77)

## 2010-07-21 LAB — BASIC METABOLIC PANEL
BUN: 18 mg/dL (ref 6–23)
Chloride: 94 mEq/L — ABNORMAL LOW (ref 96–112)
Creatinine, Ser: 0.76 mg/dL (ref 0.4–1.2)
GFR calc Af Amer: >60 mL/min (ref >60)
GFR calc non Af Amer: >60 mL/min (ref >60)
Potassium: 4.1 mEq/L (ref 3.5–5.1)

## 2010-07-21 LAB — POCT CARDIAC MARKERS
CKMB, poc: 1.1 ng/mL (ref 1.0–8.0)
Troponin i, poc: <0.05 ng/mL (ref 0.00–0.09)

## 2010-07-21 LAB — GLUCOSE, CAPILLARY: Glucose-Capillary: 243 mg/dL — ABNORMAL HIGH (ref 70–99)

## 2010-07-22 ENCOUNTER — Inpatient Hospital Stay (HOSPITAL_COMMUNITY): Payer: Medicare Other

## 2010-07-22 LAB — GLUCOSE, CAPILLARY
Glucose-Capillary: 123 mg/dL — ABNORMAL HIGH (ref 70–99)
Glucose-Capillary: 114 mg/dL — ABNORMAL HIGH (ref 70–99)
Glucose-Capillary: 138 mg/dL — ABNORMAL HIGH (ref 70–99)

## 2010-07-22 LAB — CARDIAC PANEL(CRET KIN+CKTOT+MB+TROPI)
CK, MB: 2.9 ng/mL (ref 0.3–4.0)
Relative Index: INVALID (ref 0.0–2.5)
Relative Index: INVALID (ref 0.0–2.5)
Relative Index: INVALID (ref 0.0–2.5)
Total CK: 48 U/L (ref 7–177)
Total CK: 47 U/L (ref 7–177)
Troponin I: <0.3 ng/mL (ref <0.30)
Troponin I: 0.37 ng/mL (ref <0.30)

## 2010-07-22 LAB — LIPID PANEL
HDL: 44 mg/dL (ref >39)
Total CHOL/HDL Ratio: 3.5 RATIO
Triglycerides: 85 mg/dL (ref <150)
VLDL: 17 mg/dL (ref 0–40)

## 2010-07-22 LAB — LIPASE, BLOOD: Lipase: 16 U/L (ref 11–59)

## 2010-07-22 LAB — AMYLASE: Amylase: 30 U/L (ref 0–105)

## 2010-07-22 LAB — TSH: TSH: 0.915 u[IU]/mL (ref 0.350–4.500)

## 2010-07-22 LAB — HEMOGLOBIN A1C: Mean Plasma Glucose: 171 mg/dL — ABNORMAL HIGH (ref <117)

## 2010-07-23 LAB — GLUCOSE, CAPILLARY
Glucose-Capillary: 143 mg/dL — ABNORMAL HIGH (ref 70–99)
Glucose-Capillary: 150 mg/dL — ABNORMAL HIGH (ref 70–99)

## 2010-07-23 LAB — CARDIAC PANEL(CRET KIN+CKTOT+MB+TROPI)
Relative Index: INVALID (ref 0.0–2.5)
Relative Index: INVALID (ref 0.0–2.5)
Relative Index: INVALID (ref 0.0–2.5)
Total CK: 64 U/L (ref 7–177)
Troponin I: 0.4 ng/mL (ref <0.30)

## 2010-07-23 LAB — COMPREHENSIVE METABOLIC PANEL
BUN: 21 mg/dL (ref 6–23)
CO2: 27 mEq/L (ref 19–32)
Chloride: 100 mEq/L (ref 96–112)
Creatinine, Ser: 0.82 mg/dL (ref 0.4–1.2)
GFR calc non Af Amer: >60 mL/min (ref >60)
Total Bilirubin: 0.6 mg/dL (ref 0.3–1.2)

## 2010-07-23 LAB — CBC
MCH: 31.5 pg (ref 26.0–34.0)
MCHC: 34.6 g/dL (ref 30.0–36.0)
Platelets: 172 10*3/uL (ref 150–400)

## 2010-07-23 LAB — PROTIME-INR: Prothrombin Time: 13.3 seconds (ref 11.6–15.2)

## 2010-07-24 ENCOUNTER — Inpatient Hospital Stay (HOSPITAL_COMMUNITY): Payer: Medicare Other

## 2010-07-24 HISTORY — PX: CARDIAC CATHETERIZATION: SHX172

## 2010-07-24 LAB — CBC
MCV: 90.9 fL (ref 78.0–100.0)
Platelets: 145 10*3/uL — ABNORMAL LOW (ref 150–400)
RBC: 3.52 MIL/uL — ABNORMAL LOW (ref 3.87–5.11)
WBC: 3.2 10*3/uL — ABNORMAL LOW (ref 4.0–10.5)

## 2010-07-24 LAB — GLUCOSE, CAPILLARY: Glucose-Capillary: 139 mg/dL — ABNORMAL HIGH (ref 70–99)

## 2010-07-24 LAB — HEPATIC FUNCTION PANEL
ALT: 186 U/L — ABNORMAL HIGH (ref 0–35)
Alkaline Phosphatase: 111 U/L (ref 39–117)
Bilirubin, Direct: <0.1 mg/dL (ref 0.0–0.3)

## 2010-07-24 LAB — SURGICAL PCR SCREEN: MRSA, PCR: NEGATIVE

## 2010-07-24 LAB — HEPARIN LEVEL (UNFRACTIONATED): Heparin Unfractionated: 0.28 IU/mL — ABNORMAL LOW (ref 0.30–0.70)

## 2010-07-25 LAB — GLUCOSE, CAPILLARY: Glucose-Capillary: 172 mg/dL — ABNORMAL HIGH (ref 70–99)

## 2010-07-25 LAB — HEPATITIS PANEL, ACUTE: HCV Ab: NEGATIVE

## 2010-07-25 NOTE — Op Note (Signed)
Sheila, [name]                ACCOUNT NO.:  [id number]   MEDICAL RECORD NO.:  [id number]          PATIENT TYPE:  OIB   LOCATION:  5733                         FACILITY:  [facility]   PHYSICIAN:  [name], M.D.  DATE OF BIRTH:  Feb 02, 1935   DATE OF PROCEDURE:  02/17/2007  DATE OF DISCHARGE:                               OPERATIVE REPORT   PREOPERATIVE DIAGNOSIS:  Right breast carcinoma with satellite nodule.   POSTOPERATIVE DIAGNOSIS:  Right breast carcinoma (4.6 x 3.3 cm MRI),  with 8 mm satellite nodule.   PROCEDURES:  Injection of methylene blue and a right modified radical  mastectomy.   SURGEON:  [name], M.D.   FIRST ASSISTANT:  None.   ANESTHESIA:  General endotracheal.   BLOOD LOSS:  200 mL.   DRAINS:  Left in were two 19-French Blake drains.   COMPLICATIONS:  None.   PROCEDURE:  [name] is a 75 year old white female who was found to  have a carcinoma in the upper-outer quadrant of her right breast, which  was biopsied originally on October 08, 2006.  It showed an invasive mammary  carcinoma.  The tumor measured approximately 2.9 cm by ultrasound, but  appeared as big as 4.6 cm  by MRI with a satellite lesion.  She  underwent neoadjuvant therapy by Dr. Pierce [name], but there was not a  significant change in the size of the tumor by MRI.  By palpation, the  tumor had seemed to respond;  And with the larger tumor with the  satellite lesion, it was felt she would best served with a mastectomy.  She was not interested in immediate reconstruction.   The indication and potential complications were explained to the  patient.  Potential complications include, but not limited to:  bleeding, infection, recurrence of her cancer and nerve injury.   OPERATIVE NOTE:  The patient was placed in a supine position.  She had  her right breast marked by me preoperatively.  She also had it  infiltrated with 1 mCi of technetium sulfa colloid as a radioisotope tag  for   sentinel lymph node.  I also injected about 3 mL of 40% methylene  blue.   The right breast was prepped with Betadine solution and and  sterilely  draped.  A time-out was held, identifying the patient and the procedure.   I made an incision that included her nipple.  I tried to exclude the  skin over the upper-outer quadrant right breast mass specimen.  I used  the Neoprobe to identify sentinel lymph node, but I could not identify  one.  I cut down into the right axilla and looked for any evidence of  methylene blue, and I could not identify any of that.  Without an  identified sentinel lymph node.  She also had some palpable, fairly firm  or hard nodes.  I felt she would be best served with proceeding with an  axillary node dissection.   So I went on and completed my mastectomy, going medially to the lateral  edge of the sternum, superiorly to 2  fingers below the clavicle inferior  to the vesting fascia.  I then reflected the breast off the chest wall.  I then went up and dissected out the axilla.  I identified axillary  vein.  I swept the axillary contents superiorly.  I had to take one  tributary from the axillary vein.  I tied with 3-0 silk suture.  I also  used clips on the axilla.  I identified the long thoracic nerve of Bell  and the thoracodorsal nerves; spared these during the dissection.  After  sweeping out the axillary contents, I did identify what I thought was  the highest axillary node -- this was up under the pectoral minor, where  the axillary vein went behind the pectoral minor and sent as a separate  specimen.  I also, after dissecting out the axilla, she had some other  fat which I am sure contained node material that I also swept out; but  this was more just generalized axillary contents.  I labeled this  separately so it would b identified.   I Then irrigated the wound with 3 liters of saline.  I placed two 19-  Blake drains inferiorly,  and sewed these in place  with 2-0 nylon  sutures.  I took an ellipse of inferior skin, just because she had so  much redundant skin and had such kind of a fatty breast.  I marked that  with the skin cranial long suture and a short suture medial.  I then  closed the wound with interrupted 3-0 Vicryl sutures; closed the skin  with skin staples.  Then Steri-Strips and tincture Benzoin, and  sterilely dressed with 4x4s and an Ace wrap.   The patient tolerated the procedure well and was transported to the  recovery room in good condition.      [name], M.D.  Electronically Signed     DHN/MEDQ  D:  02/17/2007  T:  02/17/2007  Job:  [id number]   cc:   [name], M.D.  Sheila Crane, MD

## 2010-07-25 NOTE — Discharge Summary (Signed)
[name], [name]                ACCOUNT NO.:  [id number]   MEDICAL RECORD NO.:  [id number]          PATIENT TYPE:  INP   LOCATION:  1334                         FACILITY:  [facility]   PHYSICIAN:  Pierce Crane, MD        DATE OF BIRTH:  11-24-1934   DATE OF ADMISSION:  12/27/2006  DATE OF DISCHARGE:  12/30/2006                               DISCHARGE SUMMARY   DISCHARGE DIAGNOSES:  1. Breast cancer.  2. History of depression.  3. History of hypertension.  4. History of type 2 diabetes.  5. Admission for nausea and vomiting.   This is a pleasant 76 year old with a well known history of locally  advanced breast cancer who was seen after her 3rd cycle of FEC, with  complaints of nausea, vomiting, and loose stools.  She was admitted to  the hospital for IV fluid hydration and further management.  Her  relevant physical examination is documented in the chart.   HOSPITAL COURSE:  Initial white count was 1, ANC 200, and a platelet  count of 83,000.  The patient was admitted to the hospital, given IV  fluids, stools were checked for C. diff.  She improved in the hospital,  had no other complications while in the hospital.  She had no x-rays  done in hospital.  On December 29, 2006, her white count was 2 with an  ANC of 900, and a hemoglobin of 9, and platelet count 60,000.  She was  placed on Levaquin empirically.  No cultures were obtained while in  hospital.  The patient was thought to be stable for discharge on December 30, 2006.  Discharged in stable condition.   DISCHARGE MEDICATIONS:  Essentially the same as her admitting  medications,  1. Lexapro 10 mg a day.  2. Inderal 40 mg daily.  3. Metformin 500 mg b.i.d.  4. Glipizide XL 10 mg b.i.d.  5. HydroDIURIL 12.5 daily.  6. Magic mouthwash was added to her regimen.   The patient has a followup appointment to be seen in the outpatient  clinic on January 09, 2007.  Consideration will be made to change her  chemotherapy to Taxotere  for an additional 3-4 cycles.      Pierce Crane, MD  Electronically Signed     PR/MEDQ  D:  12/30/2006  T:  12/30/2006  Job:  [id number]

## 2010-07-25 NOTE — Op Note (Signed)
[name], [name]                ACCOUNT NO.:  [id number]   MEDICAL RECORD NO.:  [id number]          PATIENT TYPE:  AMB   LOCATION:  DSC                          FACILITY:  [facility]   PHYSICIAN:  Sandria Bales. Ezzard Standing, M.D.  DATE OF BIRTH:  29-Nov-1934   DATE OF PROCEDURE:  10/30/2006  DATE OF DISCHARGE:                               OPERATIVE REPORT   PREOPERATIVE DIAGNOSIS:  Right breast carcinoma, anticipate neoadjuvant  therapy.   POSTOPERATIVE DIAGNOSIS:  Right breast carcinoma, anticipate neoadjuvant  therapy need of intravenous access.   PROCEDURE:  Left subclavian port placement.   SURGEON:  Sandria Bales. Ezzard Standing, M.D.   No first assistant.   ANESTHESIA:  Monitored anesthesia care with approximately 25 mL of 1%  Xylocaine.   INDICATIONS FOR PROCEDURE:  Ms. Rybolt is a 76 year old white female  that has a biopsy proven carcinoma of the upper outer quadrant of her  right breast.  She is interested in neoadjuvant therapy per Dr. Donnie Coffin  and she comes for placement of a Port-A-Cath.   The indications, potential complications of Port-A-Cath were explained  to the patient.  Potential complications include bleeding, infection,  pneumothorax, deep venous thrombosis.   OPERATIVE NOTE:  The patient was placed in the supine position with a  roll under her back.  Her chest was prepped with Betadine solution and  sterilely draped.  We gave her 1 gram of Ancef at the initiation of the  procedure.  I used the Bard X-port catheter kit.  I accessed the left  subclavian vein with a 14-gauge needle and threaded a guide wire into  the superior vena cava, and checked this with fluoroscopy.   I then developed a pocket in the upper aspect of her left breast, passed  the Silastic tubing from the pocket to the left subclavian stick site  and then threaded the Silastic tubing into the left subclavian vein  using the 8-French introducer.   I positioned this at the junction of the superior vena cava  right  atrium.  We then got good blood flow and this was checked with  fluoroscopy.  I then attached the Silastic tubing to the reservoir with  a Bayonet device.  I sewed it in place with a 3-0 Vicryl suture.  The  entire unit and tubing was checked with fluoroscopy.  I then injected  concentrated heparin in to flush the entire unit, 100 units of heparin  per mL, before I had been using a 10 unit per mL solution for flushing  the catheter and the reservoir.   The entire unit seemed to sit well.  I then closed the skin with a 3-0  Vicryl suture and the skin with 5-0 Vicryl suture, painted the wound  with tincture of benzoin and Steri-Stripped it.   The patient tolerated the procedure well, was transported to the  recovery room in good condition.  Sponge and needle count were correct  at the end of the case.  Chest x-rays pending at the time of this  dictation.      Sandria Bales. Ezzard Standing, M.D.  Electronically  Signed     DHN/MEDQ  D:  10/30/2006  T:  10/30/2006  Job:  [id number]   cc:   Marjory Lies, M.D.  Pierce Crane, M.D.

## 2010-07-25 NOTE — H&P (Signed)
[name], QUIRK                ACCOUNT NO.:  [id number]   MEDICAL RECORD NO.:  [id number]          PATIENT TYPE:  INP   LOCATION:  1334                         FACILITY:  [facility]   PHYSICIAN:  Pierce Crane, MD        DATE OF BIRTH:  March 30, 1934   DATE OF ADMISSION:  12/27/2006  DATE OF DISCHARGE:                              HISTORY & PHYSICAL   SUBJECTIVE/HISTORY OF PRESENT ILLNESS:  Mrs. Battle is a delightful 76-  year-old [place] woman who is being admitted to the [facility] 3rd floor oncology unit due to dehydration secondary to nausea,  vomiting, diarrhea and severely neutropenic following her third cycle of  neoadjuvant, every-three-week FEC for locally advanced right breast  carcinoma.  Of note, Mrs. Vankirk was seen last in our office on December 25, 2006, for a routine nadir assessment.  At that time, her total WBC  was 1200 with an ANC of 600.  She was day #3 Levaquin, day #6 Neulasta.  She had been describing some mild diarrhea but had been well controlled  with Imodium, but otherwise she was profoundly fatigued but denied any  fevers.  She contacted our office today stating that she is now having  nausea with emesis and persistent diarrhea stools.  She also had  described some low-grade fevers.  Upon assessment in our office today,  she was noted to be mildly hypotensive, blood pressure 98/65, pulse of  74, she was afebrile with a temperature of 97.6.  CBC was obtained which  showed her total white blood cell count of 1000 with her ANC at 200.  Serum chemistries were really essentially within normal limits.  But the  fact that she is still so profoundly fatigued, we felt it prudent for a  hospitalization.  She did receive Zofran and Decadron as an outpatient,  along with some mild Ativan to help with her anxiety.  She was a bit  perkier by the time of admission, but we felt it was still prudent given  the upcoming weekend.   PAST MEDICAL HISTORY:  As per  HPI to include a history of hypertension  for which she has been on antihypertensives, type 2 diabetes for which  she takes metformin and glipizide.  She states that her blood sugars  have been in the 160 range.  She has been trying to keep her medications  down.  She has a history of familial tremor as well as a history of  depression.   PAST SURGICAL HISTORY:  Includes cholecystectomy, thyroid goiter surgery  in 1968, a previous lumpectomy on the left breast for a benign process  in 1995, cardiac catheterization about 3 years ago which was normal,  prior knee surgery, recent port placement.   MEDICATIONS AT THE TIME OF ADMISSION:  1. Lexapro 10 mg one p.o. daily.  2. Inderal 40 mg p.o. daily.  3. Metformin 500 mg one p.o. b.i.d.  4. Glipizide XL 10 mg one p.o. b.i.d.  5. HydroDIURIL 12.5 mg p.o. daily.   MEDICATION ALLERGIES:  STATINS and OXYCONTIN.   FAMILY MEDICAL  HISTORY:  Really noncontributory, though __________  died  from complications of colon carcinoma.  Her mother is deceased from  complications of heart disease.  She has one sister living with a  history of dementia.   SOCIAL HISTORY:  She and her husband are residing in [place], [place].  She has been married for the past 43 years.  She has one  adult son and one daughter.  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  She does not smoke and does not drink.   REVIEW OF SYSTEMS:  As per HPI.  Otherwise, really pretty  noncontributory, specifically denying any hematochezia or melenic  stools.   OBJECTIVE/PHYSICAL EXAM:  VITAL SIGNS:  Blood pressure, again, 98/65,  pulse 74, respirations 18, temperature 97.6, weight was not obtained.  HEENT:  Conjunctivae pink, sclerae anicteric. Oropharynx shows some mild  oral mucositis symptoms that may be occurring.  LUNGS:  Actually clear.  HEART:  Regular rate and rhythm.  ABDOMEN:  Soft and nontender right now without significant  hyperactive  bowel sounds.  EXTREMITIES:  Benign.  NEUROLOGIC:  Nonfocal.   LABORATORY DATA:  As per HPI to include a platelet count of 83,000;  hemoglobin of 10.8.   IMPRESSION/RECOMMENDATION:  1. Dehydration secondary to nausea, vomiting and diarrhea stools,      currently day #9, cycle #3 of every-three-week FEC given in the      neoadjuvant fashion for a locally advanced right breast carcinoma.  2. Persistent diarrhea stools despite use of Imodium AD, have to rule      out a Clostridium difficile process.   The case has been reviewed with Dr. Donnie Coffin who agrees with hospital  admission.  We will continue IV fluid hydration with normal saline at  100 mL per hour.  Since she is diabetic we will follow sensitive sliding  scale insulin coverage with CBGs.  We will try to continue her  Glucophage and glipizide.  We will hold her antihypertensives though her  Inderal we will not discontinue since she is on 40 mg a day, will  decrease it to 20 mg per day.  We will start IV Avelox at 400 mg IV  q.24h.  Ruthmary understands and agrees with this admission.      Amada Kingfisher, P.A.      Pierce Crane, MD  Electronically Signed    CTS/MEDQ  D:  12/27/2006  T:  12/27/2006  Job:  [id number]

## 2010-07-25 NOTE — Op Note (Signed)
[name], [name]                ACCOUNT NO.:  [id number]   MEDICAL RECORD NO.:  [id number]          PATIENT TYPE:  AMB   LOCATION:  DSC                          FACILITY:  [facility]   PHYSICIAN:  Sandria Bales. Ezzard Standing, M.D.  DATE OF BIRTH:  03/09/1935   DATE OF PROCEDURE:  02/26/2008  DATE OF DISCHARGE:                               OPERATIVE REPORT   Date of Surgery ?   PREOPERATIVE DIAGNOSIS:  History of right breast cancer.  The patient  with Port-A-Cath.   POSTOPERATIVE DIAGNOSIS:  History of right breast cancer, completed  chemotherapy.  The patient with Port-A-Cath.   PROCEDURE:  Left subclavian Port-A-Cath removal.   SURGEON:  Sandria Bales. Ezzard Standing, MD   ANESTHESIA:  15 mL of 1% Xylocaine.   COMPLICATIONS:  None.   INDICATIONS FOR PROCEDURE:  Ms. Mcilvaine is a 76 year old female patient  of Dr. Marjory Lies who had a right breast carcinoma, has been through  chemo therapy, radiation therapy by Dr. Pierce Crane and Dr. Antony Blackbird.  She has now completed this and comes for removal of her Port-A-  Cath.   The indications and potential complications were explained to the  patient.  Potential complications include bleeding, infection, and nerve  injury.   OPERATIVE NOTE:  The patient was placed in a supine position.  Her left  chest was prepped with Betadine solution and sterilely draped.  Infiltrated the skin with 15 mL of 1% Xylocaine with epinephrine.  I  then cut down to the Port-A-Cath and removed it without difficulty.  She  went with the Port-A-Cath well as we put it in a cup and we will give it  to her.   I then closed the skin with 5-0 Vicryl suture and painted the skin with  tincture, benzoin, and steri-stripped it.   The patient tolerated the procedure well, was transported to home today.  She has a friend who has brought her here and her husband is in the car.      Sandria Bales. Ezzard Standing, M.D.  Electronically Signed     DHN/MEDQ  D:  02/26/2008  T:  02/27/2008   Job:  [id number]   cc:   Pierce Crane, MD  Billie Lade, M.D.  Marjory Lies, M.D.

## 2010-07-26 NOTE — Cardiovascular Report (Signed)
[name]                ACCOUNT NO.:  [id number]  MEDICAL RECORD NO.:  [id number]           PATIENT TYPE:  I  LOCATION:  6740                         FACILITY:  [facility]  PHYSICIAN:  Thurmon Fair, MD     DATE OF BIRTH:  [birth year]  DATE OF PROCEDURE: DATE OF DISCHARGE:  07/24/2010                           CARDIAC CATHETERIZATION   [name] is a 76 year old woman who presents with severe nausea, vomiting, dizziness, electrocardiographic changes and mildly positive cardiac troponin I at approximately 0.4.  She has a previous left bundle- branch block, but has developed marked T-wave inversion in multiple anterior precordial leads and marked QT interval prolongation.  The cardiac catheterization is performed due to suspicion of an acute coronary syndrome.  PROCEDURES PERFORMED: 1. Left heart catheterization. 2. Selective coronary angiography. 3. Left ventriculography.  After risks and benefits of the procedure were described, the patient provided informed consent and was prepped and draped in usual sterile fashion.  Local anesthesia with 1% lidocaine was administered in the right groin area.  A 5-French right common femoral artery sheath was introduced via the modified Seldinger technique.  Using 5-French JL-4, JR and an angled pigtail catheter selective cannulation of the left and right coronary arteries and left ventricle was respectively performed. Multiple coronary angiogram with variety of projections as well as a left ventriculogram in the RAO projection were performed.  At the end of the procedure, all catheters and sheath were removed and hemostasis was achieved with manual pressure.  No immediate complications occurred.  FINDINGS: 1. The left main coronary artery is a nondominant vessel that     bifurcates in usual fashion in the left anterior descending artery     and left circumflex coronary artery.  There is no evidence of     significant  atherosclerotic lesions in the left main coronary     artery. 2. The left anterior descending artery is an average-sized vessel with     two major diagonal arteries and rather tortuous in its course.     Minimal coronary atherosclerotic changes are seen.  There is no     evidence of a meaningful stenosis. 3. The left circumflex coronary artery is a large but nondominant     vessel that generates two major oblique marginal arteries.  There     is no evidence of significant stenosis in the left circumflex     coronary system. 4. The right coronary artery is a large dominant vessel with mild     coronary atherosclerosis especially in its proximal to mid third,     but with no meaningful stenoses.  It generates a large     posterolateral ventricular artery and a posterior descending     artery.  Left ventriculogram is a poor quality opacification but no obvious regional wall motion abnormalities are seen and left ventricular ejection fraction appears to be normal.  The left ventricular end- diastolic pressure is 14.  There was no evidence of mitral regurgitation or of any gradient on pullback across the aortic valve.  CONCLUSION:  [name] does not have any evidence of coronary artery  disease and in fact has minimal if any coronary atherosclerotic changes. There is no evidence to support a recent acute atherothrombotic coronary syndrome.  Review of the electrocardiographic changes and the minimum increasing cardiac troponin-I suggest a pattern reminiscent of a stress cardiomyopathy, although the diagnosis is challenging due to the presence of a previously left bundle-branch block and the poor quality of the left ventriculogram.  If she does have a stress cardiomyopathy, this could be followed by complete resolution without any cardiac sequelae.  At this point in time, additional cardiac investigation does not appear to be necessary.  A repeat electrocardiogram and echocardiogram  would probably be reasonable in about a week to 2 weeks time.     Thurmon Fair, MD     MC/MEDQ  D:  07/24/2010  T:  07/25/2010  Job:  [id number]  cc:   [facility] Sheila Canal, MD  Electronically Signed by Thurmon Fair M.D. on 07/26/2010 01:37:26 PM

## 2010-07-28 NOTE — Cardiovascular Report (Signed)
NAMEMARRY, KUSCH                          ACCOUNT NO.:  [id number]   MEDICAL RECORD NO.:  [id number]                   PATIENT TYPE:  OIB   LOCATION:  6501                                 FACILITY:  [facility]   PHYSICIAN:  Learta Codding, M.D. [facility]             DATE OF BIRTH:  [birth year]   DATE OF PROCEDURE:  09/10/2003  DATE OF DISCHARGE:  09/10/2003                              CARDIAC CATHETERIZATION   REFERRING PHYSICIAN:  Marjory Lies, M.D. with [facility].   PRIMARY CARDIOLOGIST:  Jesse Sans. Wall, M.D.   ORTHOPEDIST:  Charlesetta Shanks Gleason, M.D.   PROCEDURES PERFORMED:  1. Left heart catheterization.  2. Selective coronary angiography.  3. Ventriculography.   DIAGNOSES:  No evidence of flow-limiting epicardial coronary artery disease.   CARDIOLOGIST:  Learta Codding, M.D.   INDICATIONS:  The patient is a 76 year old female with multiple cardiac risk  factors and found to have abnormal electrocardiogram.  The patient was  scheduled for knee surgery and subsequently was cancelled due to the  abnormal electrocardiogram.  An echocardiogram also revealed normal LV  function with a possible area of inferior hypokinesis.  The patient has now  been scheduled for cardiac catheterization to define her coronary anatomy.   DESCRIPTION OF PROCEDURE:  After informed consent was obtained the patient  was brought to the catheterization laboratory.  The right groin was  sterilely prepped and draped.  A 4 French arterial sheath was placed using  the modified Seldinger technique.  Number 4 French JL-4 and JR-4 catheters  were used for coronary angiography.  A 4 French pigtail catheter was used  for ventriculography.   At the termination of the procedure all catheters and sheaths were removed.  The patient was taken back to the holding area and no complications were  encountered.   FINDINGS:   HEMODYNAMIC DATA:  Left ventricular pressure was 181/9 mmHg.  Aortic  pressure  181/83 mmHg.   VENTRICULOGRAPHIC DATA:  Ventriculography was performed in the single-plane  RAO projection.  The ejection fraction was 60%.  Mild mitral regurgitation  on the second beat.  During the ventriculogram there was more severe mitral  regurgitation.  This was likely catheter-induced.   ANGIOGRAPHIC DATA:  Selective Coronary Angiography  1. Left Main Coronary Artery:  The left main coronary artery was a large     caliber vessel with no evidence of flow-limiting disease.   1. Left Anterior Descending Artery:  The left anterior descending artery was     a large vessel wrapping the apex and giving rise to several diagonal     branches with no flow-limiting disease.   1. Circumflex Coronary Artery:  The circumflex coronary artery and its     obtuse marginal branch are free of flow-limiting disease.   1. Right Coronary Artery:  The right coronary artery is a large caliber     vessel terminating in a posterior  descending artery and two     posterolateral branches.  There was no evidence of flow-limiting disease     noted.   IMPRESSION AND RECOMMENDATIONS:  No evidence of flow-limiting epicardial  coronary artery disease.   The patient may continue medical therapy.   The patient was hypertensive during the procedure, but this may be related  to anxiety.  Further follow up is required and adjustments in medications if  needed.  There are no definite contraindications for the patient to proceed  with her knee surgery at this point in time.                                               Learta Codding, M.D. [facility]    GED/MEDQ  D:  09/10/2003  T:  09/11/2003  Job:  [id number]   cc:   Marjory Lies, M.D.  [facility] C. Wall, M.D.   Charlesetta Shanks Gleason, M.D.

## 2010-08-02 NOTE — Discharge Summary (Signed)
[name], [name]                ACCOUNT NO.:  [id number]  MEDICAL RECORD NO.:  [id number]           PATIENT TYPE:  I  LOCATION:  6740                         FACILITY:  [facility]  PHYSICIAN:  Conley Canal, MD      DATE OF BIRTH:  October 05, 1934  DATE OF ADMISSION:  07/21/2010 DATE OF DISCHARGE:  07/24/2010                              DISCHARGE SUMMARY   PRIMARY CARE PHYSICIAN:  Marjory Lies, MD  NEUROLOGIST:  Pramod P. Pearlean Brownie, MD  CARDIOLOGIST:  Thurmon Fair, MD with [facility].  DISCHARGE DIAGNOSES: 1. Nausea, vomiting, and diarrhea now resolved. 2. Vertigo, acute-on-chronic, negative for acute cerebrovascular     accident. 3. Hyponatremia secondary to dehydration. 4. Diabetes. 5. Hypertension, admitted with hypotension. 6. Hyperlipidemia. 7. History of coronary artery disease with a non-ST elevation MI and     left bundle-branch block this admission. 8. Elevated liver enzymes of uncertain etiology, possibly related to     Femara.  PREVIOUS DIAGNOSES: 1. Hypertension. 2. History of breast cancer. 3. History of cerebrovascular accident. 4. History of middle cerebral artery stenosis status post stenting. 5. History of morbid obesity, history of depression.  CONDITION ON DISCHARGE:  The patient is resting in bed after her cardiac cath.  She is alert and oriented.  She would like to be discharged this evening.  She is able to stand with the assistance of a walker and walk to the edge of her room.  She plans to go home with her daughter and will have home health, physical therapy, and occupational therapy.  HISTORY AND BRIEF HOSPITAL COURSE: 1. [name] is a 76 year old female with a history of diabetes,     recent CVA, and coronary artery disease with multiple other medical     problems.  She has been struggling with vertigo on and off for     years, and presented to the emergency room today with persistent     nausea, vomiting, and diarrhea.  Her  neighbor had brought her some     food to eat.  Right after eating the food, she stated to feel     abnormal and nauseated, and later she began vomiting.  Afterwards,     her vertigo became more acute.  On admission, her diarrhea seemed     to slow down.  However, she came in for further management.  On Jul 22, 2010, the patient's cardiac enzymes were cycled and found to be     elevated with a troponin of up to 0.43, creatine kinase 66, CK-MB     3.7 also her pro BNP 1301.  With regard to her nausea, vomiting,     and chronic vertigo, she received a CT of her head in the emergency     department that showed no acute abnormality.  Afterwards, she had     an MRI of her brain that showed remote infarct of the posterior     right lentiform nucleus and posterior limb right internal capsule,     but no acute intracranial abnormalities.  The following morning,  she had a chest x-ray that showed stable linear left lower lobe     scarring or atelectasis.  No new focal opacity.  Her dizziness,     vomiting, and nausea improved with symptomatic treatment with     Phenergan and fluids.    Because her cardiac enzymes were elevated     and she was noted to have an abnormal EKG inclusive of a left     bundle-branch block, Dr. Rennis Golden with [facility]     saw her on Jul 22, 2010.  Dr. Blanchie Dessert impression was she had     elevation of her troponin that was not acute coronary syndrome     related.  She had an old left bundle-branch block and new     anterolateral deep T-wave inversions because of this, she     recommended that her cardiac enzymes be cycled for 24 hours also     that she have an EKG done daily and that she would most likely need     a cardiac cath on Monday morning, Jul 24, 2010.  On Monday, Jul 24, 2010, the patient had cardiac cath by Dr. Royann Shivers who noted that     she had angina pectoris with elevated cardiac enzymes, normal     coronary arteries.  There is  minimal atherosclerosis, definitely no     hemodynamically meaningful stenosis.  He recommended that beta     blockers be discontinued as they may be deleterious secondary to     bradycardia and first-degree A-V block.  The exact cause of her T-     wave inversion is unclear.  However, he felt that it may have been     caused by stress cardiomyopathy due to intracranial problems.     There were no complications with the cardiac cath, and no further     cardiac workup in planned.  With regard to her elevated LFTs on Jul 23, 2010, the patient was noted have a total bilirubin 0.6, alkaline phosphatase 139, AST 109, ALT 304. Today, the day of discharge, her total bilirubin 0.2, AST 46, ALT 186, alkaline phosphatase 111.  She had an ultrasound of her abdomen done today, Jul 24, 2010, that showed unremarkable abdomen status post cholecystectomy.  Within her liver, there was no focal lesion identified.  The liver was felt to be within normal limits and parenchymal echogenicity.  Her common bile duct was 6.7 mm in diameter and found to be unremarkable.  As her ultrasound was negative and her LFTs were trending downward, it was felt that possibly this could have been due to shocked liver or a reaction to her Femara.  Consequently, her Femara is being held, and she will follow up with her primary care physician after discharged to see when it should be restarted.  1. Hyponatremia resolved with IV fluids and rehydration. 2. Type 2 diabetes remained quiet while in-house. 3. Hypertension.  The patient was originally hypotensive on admission.     During her stay, Inderal/propranolol has been discontinued.  She     will be discharged on hydrochlorothiazide.  We would recommend that     her primary care physician follow up with her blood pressure early     next week.  The cardiologist did recommend beta blockers not be     used because of her first-degree block. Next, with regard to her     vertigo,  this was felt to possibly be  due to dehydration in     combination with benign positional vertigo and Neurology was     called.  She is a regular patient of Dr. Pearlean Brownie.  He attempted to     see her while she was in house.  However, she was having her     ultrasound done and as it is the day of discharge, Dr. Pearlean Brownie feels     that it is safe for her to follow up with him in his office.  She     will be discharged on meclizine.  CONSULTATIONS:  The patient was seen by [facility], Dr. Rennis Golden, on Jul 22, 2010.  PROCEDURES:  Cardiac cath done on Jul 24, 2010.  PHYSICAL EXAMINATION ON DISCHARGE:  GENERAL:  Currently, the patient is alert and oriented, in no apparent distress, lying in bed after her cardiac cath. VITAL SIGNS:  Temperature is 97.9, pulse 49, respirations 20, blood pressure 147/73, O2 sats 94% on room air. HEENT:  Head is atraumatic, normocephalic.  Eyes are anicteric, but pupils are equal and round.  Nose shows no nasal discharge or exterior lesions.  Mouth has moist mucous membranes with good dentition. NECK:  Supple with midline trachea.  No JVD.  No lymphadenopathy. CHEST:  Demonstrates no accessory muscle use.  She has no wheezes or crackles to my exam. HEART:  Regular rate and rhythm with 2/6 systolic murmur.  No Rubs or gallops. ABDOMEN:  Obese, nontender, nondistended with bowel sounds. EXTREMITIES:  Minimal edema bilaterally.  No clubbing or cyanosis.  She is able to move all four extremities with ease. PSYCHIATRIC:  The patient is alert, oriented.  Her demeanor is pleasant, cooperative.  Her grooming is excellent.  DISCHARGE MEDICATIONS: 1. Promethazine 12.5 mg by mouth every 6 hours as needed for nausea. 2. Meclizine 25 mg by mouth 3 times a day as needed for dizziness. 3. Aspirin 325 mg 1 tablet by mouth daily. 4. Citalopram 20 mg 1/2 tablet by mouth daily. 5. Calcium 630 mg OTC 1 tablet by mouth daily. 6. Glipizide XL 10 mg 1 tablet by  mouth twice daily. 7. Hydrochlorothiazide 25 mg 1 tablet by mouth daily. 8. Insulin Lantus 5 units subcutaneously daily at bedtime. 9. Magnesium 500 mg OTC 1 tablet by mouth daily. 10.Metformin 500 mg 1 tablet by mouth twice daily. 11.Restart metformin on Jul 26, 2010.  Do not take it for 48 hours     after cardiac cath. 12.Plavix 75 mg 1 tablet by mouth daily. 13.Vitamin B12 1 tablet by mouth daily. 14.Vitamin D 2000 mg over the counter 1 tablet by mouth daily.  Stop taking the following medications: 1. Femara 2.5 mg 1 tablet by mouth daily. 2. Propranolol 40 mg 1 tablet by mouth daily. 3. Do not take metformin for 48 hours after cardiac cath.  Restart     metformin on Jul 26, 2010.  DISCHARGE INSTRUCTIONS:  The patient is to increase her activity slowly. She is to use a walker at home until she is evaluated by Physical Therapy.  She will stay with her daughter tonight.  Her diet will be low- sodium, heart-healthy, carb-modified.  FOLLOWUP APPOINTMENTS:  An appointment with Dr. Marjory Lies, early the week of Jul 31, 2010, an appointment with Dr. Pearlean Brownie, the neurologist in 1 week, an appointment with Dr. Royann Shivers, [facility] in 10-14 days.  She will be going home with advanced home care, physical therapy, and occupational therapy, and she will be evaluated for  vestibular rehab.  THINGS TO FOLLOW UP ON IN THE OUTPATIENT SETTING: 1. The patient's LFTs need to be rechecked, and evaluation needs to be     undertaken to see if she should restart her     Femara and when. 2. The patient's propranolol has been discontinued during this     hospitalization.  She will need a followup check on her blood     pressure.     Stephani Police, PA   ______________________________ Conley Canal, MD    MLY/MEDQ  D:  07/24/2010  T:  07/25/2010  Job:  [id number]  cc:   Marjory Lies, M.D. Pramod P. Pearlean Brownie, MD Thurmon Fair, MD  Electronically Signed by Algis Downs  PA on 07/31/2010 04:17:10 PM Electronically Signed by Conley Canal  on 08/02/2010 05:41:34 PM

## 2010-08-10 NOTE — H&P (Signed)
[name], [name]                ACCOUNT NO.:  [id number]  MEDICAL RECORD NO.:  [id number]           PATIENT TYPE:  E  LOCATION:  [facility]                         FACILITY:  [facility]  PHYSICIAN:  Lonia Blood, M.D.      DATE OF BIRTH:  1934-07-16  DATE OF ADMISSION:  07/21/2010 DATE OF DISCHARGE:                             HISTORY & PHYSICAL   PRIMARY CARE PHYSICIAN:  Marjory Lies, MD  PRESENTING COMPLAINT:  Nausea and vomiting.  HISTORY OF PRESENT ILLNESS:  The patient is a 76 year old female with history of diabetes, recent CVA, previous coronary artery disease with multiple other medical problems, who apparently has had chronic vertigo. She has been struggling with this vertigo on and off for years, even prior to her stroke.  She has been evaluated fully and she has tried meclizine several times.  The vertigo comes and goes, and apparently her mom also had that so the family believed this is something hereditary. She came in today secondary to persistent nausea and vomiting that started yesterday.  Per the patient, she had one bout of diarrhea yesterday morning and was still not feeling good.  Somehow at night, her neighbor some food and brought to her to eat.  Right after eating the food, she started feeling abnormal and nauseated.  She started having vomiting.  That was associated now with her dizziness and vertigo.  All day today, she has had nausea and vomiting.  The diarrhea seems to have slowed down, however.  Hence, she decided to come in for further management.  Her past medical history significant for vertigo, coronary artery disease, hypertension, diabetes, history of breast cancer, history of CVA in January 2011, after which she had right middle cerebral artery stenosis and had a stent placed by Dr. Corliss Skains.  She has history of familial tremor, morbid obesity, and depression.  PAST SURGICAL HISTORY:  She is status post cholecystectomy, status post lumpectomy,  status post thyroidectomy, status post total knee replacement using cement, status post stenting of her right middle cerebral artery.  ALLERGIES:  No known drug allergies.  CURRENT MEDICATIONS: 1. Magnesium 500 mg over the counter 1 tablet daily. 2. Vitamin D 2000 mg over the counter 1 tablet daily. 3. Vitamin B12 2500 mg daily. 4. Propranolol 40 mg daily. 5. Plavix 75 mg daily. 6. Multivitamins over the counter 1 tablet daily. 7. Metformin 500 mg 1 tablet daily. 8. Lantus insulin is 5 units at bedtime. 9. hydrochlorothiazide 25 mg daily. 10.Glipizide XL 10 mg twice a day. 11.Femara 2.5 mg daily. 12.Citalopram 20 mg daily. 13.Calcium 630 mg over the counter 1 tablet daily. 14.Aspirin 325 mg daily.  SOCIAL HISTORY:  The patient currently lives at home by herself.  Her daughter is here with her.  She was apparently supposed to be staying with the family after her last hospitalization.  She stayed for about a month, but she was around lots of people so she decided to go back to her abode.  She normally will do her thing without much of ADLs.  She has married for the past 43 years.  She has another son  beside the daughter.  No tobacco, alcohol, or IV drug use.  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  FAMILY HISTORY:  Mother died from complications of heart disease.  She also used to have vertigo.  Her father died from complications of colon cancer.  She has one sister that is living with history of dementia.  REVIEW OF SYSTEMS:  All system reviewed and negative except per HPI.  PHYSICAL EXAMINATION:  VITAL SIGNS:  Temperature is 97.9, blood pressure 101/70 with a pulse of 73, respiratory rate 24, her sats 989% on room air. GENERAL:  She is awake, alert, slightly drowsy after receiving sedatives for MRI, but she is in no acute distress.  She is arousable and communicating well. HEENT:  PERRL.  EOMI.  No pallor.  No jaundice.  No rhinorrhea. NECK:   Supple.  No JVD.  No significant lymphadenopathy.  No significant carotid bruits. RESPIRATORY:  She has good air entry bilaterally.  No wheezing.  No rales.  No crackles. CARDIOVASCULAR:  She has S1 and S2.  No audible murmur. ABDOMEN:  Obese, soft, nontender with positive bowel sounds. EXTREMITIES:  No edema, cyanosis, or clubbing. SKIN:  No rashes.  No ulcers. MUSCULOSKELETAL:  No significant joint swelling or tenderness.  She has scar from previous knee replacement. NNE UROLOGIC:  Cranial nerves II through XII seem to be intact.  She has slightly poor coordination on the right, but power is 5/5 in upper and lower extremities respectively.  The patient has positive Romberg sign.  LABORATORY DATA:  White count is 11.5 with a left shift.  ANC of 10.3. Her hemoglobin is 14 and platelet count is 204 with an MCV of 89. Sodium is 134, potassium 4.1, chloride 94, CO2 is 28, glucose 281, BUN 18, creatinine 0.76, and calcium 9.8.  Her urinalysis showed hazy urine with some glucosuria and some ketones, but negative leukocyte esterase. Initial cardiac markers are negative.  CT head without contrast showed stable findings, nothing acute.  MRI of the brain without contrast showed no acute intracranial abnormality.  There is mild generalized atrophy, remote infarct of the posterior right lentiform nucleus, and posterior limb right internal capsule.  ASSESSMENT:  This is a 76 year old female presenting with intractable nausea, vomiting, and vertigo.  From all indication, the patient probably had what appears to be acute gastroenteritis.  However, due to the vertigo it is possible that the patient also has concomitant nausea, vomiting, persistent vertigo.  The fact that she gets food from her neighbor and started feeling sick after that gives rise to the possible gastroenteritis.  She is no longer having diarrhea at this point.  PLAN: 1. Nausea, vomiting, and diarrhea.  If the patient's diarrhea  returns,     we will do stool studies.  At this point, however, she seems not to     be having any diarrhea.  We will control the nausea and vomiting     symptomatically.  I will keep her n.p.o. at least overnight,     hydrate her, and continue to monitor her. 2. Diabetes.  I will hold her oral medications but continue with     sliding scale insulin. 3. Vertigo.  The patient has had this chronically.  We will get PT/OT     consult at the same time.  I will recommend meclizine once she is     able to take p.o.'s. 4. Hyponatremia.  This is from dehydration.  We will hydrate her  effectively. 5. History of hypertension.  Blood pressure is now low.  I am checking     orthostatics on her due to the vertigo. 6. Hyperlipidemia.  Again, this is chronic.  We will check fasting     lipid panel and continue with current medication. 7. History of coronary artery disease, according to her old records.     I will put her on tele and cycle her enzymes, but she has no chest     pain at this point.     Lonia Blood, M.D.     Verlin Grills  D:  07/21/2010  T:  07/21/2010  Job:  [id number]  Electronically Signed by Lonia Blood M.D. on 08/10/2010 11:46:04 AM

## 2010-08-24 ENCOUNTER — Other Ambulatory Visit (HOSPITAL_COMMUNITY): Payer: Medicare Other

## 2010-08-24 ENCOUNTER — Observation Stay (HOSPITAL_COMMUNITY)
Admission: EM | Admit: 2010-08-24 | Discharge: 2010-08-28 | Disposition: A | Discharge status: Home / Self Care | Payer: Medicare Other | Attending: Internal Medicine | Admitting: Internal Medicine

## 2010-08-24 ENCOUNTER — Inpatient Hospital Stay (HOSPITAL_COMMUNITY): Payer: Medicare Other

## 2010-08-24 ENCOUNTER — Emergency Department (HOSPITAL_COMMUNITY): Payer: Medicare Other

## 2010-08-24 DIAGNOSIS — R197 Diarrhea, unspecified: Secondary | ICD-10-CM | POA: Insufficient documentation

## 2010-08-24 DIAGNOSIS — E785 Hyperlipidemia, unspecified: Secondary | ICD-10-CM | POA: Insufficient documentation

## 2010-08-24 DIAGNOSIS — Z8673 Personal history of transient ischemic attack (TIA), and cerebral infarction without residual deficits: Secondary | ICD-10-CM | POA: Insufficient documentation

## 2010-08-24 DIAGNOSIS — G252 Other specified forms of tremor: Secondary | ICD-10-CM | POA: Insufficient documentation

## 2010-08-24 DIAGNOSIS — E119 Type 2 diabetes mellitus without complications: Secondary | ICD-10-CM | POA: Insufficient documentation

## 2010-08-24 DIAGNOSIS — G250 Essential tremor: Secondary | ICD-10-CM | POA: Insufficient documentation

## 2010-08-24 DIAGNOSIS — R5383 Other fatigue: Secondary | ICD-10-CM | POA: Insufficient documentation

## 2010-08-24 DIAGNOSIS — R748 Abnormal levels of other serum enzymes: Secondary | ICD-10-CM | POA: Insufficient documentation

## 2010-08-24 DIAGNOSIS — K5900 Constipation, unspecified: Secondary | ICD-10-CM | POA: Insufficient documentation

## 2010-08-24 DIAGNOSIS — R112 Nausea with vomiting, unspecified: Principal | ICD-10-CM | POA: Insufficient documentation

## 2010-08-24 DIAGNOSIS — R5381 Other malaise: Secondary | ICD-10-CM | POA: Insufficient documentation

## 2010-08-24 DIAGNOSIS — K869 Disease of pancreas, unspecified: Secondary | ICD-10-CM | POA: Insufficient documentation

## 2010-08-24 DIAGNOSIS — K5730 Diverticulosis of large intestine without perforation or abscess without bleeding: Secondary | ICD-10-CM | POA: Insufficient documentation

## 2010-08-24 DIAGNOSIS — K838 Other specified diseases of biliary tract: Secondary | ICD-10-CM | POA: Insufficient documentation

## 2010-08-24 DIAGNOSIS — I10 Essential (primary) hypertension: Secondary | ICD-10-CM | POA: Insufficient documentation

## 2010-08-24 DIAGNOSIS — Z79899 Other long term (current) drug therapy: Secondary | ICD-10-CM | POA: Insufficient documentation

## 2010-08-24 DIAGNOSIS — F329 Major depressive disorder, single episode, unspecified: Secondary | ICD-10-CM | POA: Insufficient documentation

## 2010-08-24 DIAGNOSIS — I447 Left bundle-branch block, unspecified: Secondary | ICD-10-CM | POA: Insufficient documentation

## 2010-08-24 DIAGNOSIS — E871 Hypo-osmolality and hyponatremia: Secondary | ICD-10-CM | POA: Insufficient documentation

## 2010-08-24 DIAGNOSIS — R109 Unspecified abdominal pain: Secondary | ICD-10-CM | POA: Insufficient documentation

## 2010-08-24 DIAGNOSIS — C50919 Malignant neoplasm of unspecified site of unspecified female breast: Secondary | ICD-10-CM | POA: Insufficient documentation

## 2010-08-24 DIAGNOSIS — I517 Cardiomegaly: Secondary | ICD-10-CM | POA: Insufficient documentation

## 2010-08-24 DIAGNOSIS — I252 Old myocardial infarction: Secondary | ICD-10-CM | POA: Insufficient documentation

## 2010-08-24 DIAGNOSIS — Z96659 Presence of unspecified artificial knee joint: Secondary | ICD-10-CM | POA: Insufficient documentation

## 2010-08-24 DIAGNOSIS — F3289 Other specified depressive episodes: Secondary | ICD-10-CM | POA: Insufficient documentation

## 2010-08-24 LAB — TROPONIN I: Troponin I: <0.3 ng/mL (ref <0.30)

## 2010-08-24 LAB — DIFFERENTIAL
Basophils Absolute: 0 10*3/uL (ref 0.0–0.1)
Lymphocytes Relative: 3 % — ABNORMAL LOW (ref 12–46)
Monocytes Absolute: 0.6 10*3/uL (ref 0.1–1.0)
Monocytes Relative: 8 % (ref 3–12)
Neutro Abs: 7.3 10*3/uL (ref 1.7–7.7)

## 2010-08-24 LAB — COMPREHENSIVE METABOLIC PANEL
Alkaline Phosphatase: 191 U/L — ABNORMAL HIGH (ref 39–117)
BUN: 17 mg/dL (ref 6–23)
GFR calc Af Amer: >60 mL/min (ref >60)
GFR calc non Af Amer: >60 mL/min (ref >60)
Glucose, Bld: 212 mg/dL — ABNORMAL HIGH (ref 70–99)
Potassium: 4.1 mEq/L (ref 3.5–5.1)
Total Bilirubin: 1 mg/dL (ref 0.3–1.2)
Total Protein: 6.7 g/dL (ref 6.0–8.3)

## 2010-08-24 LAB — GLUCOSE, CAPILLARY: Glucose-Capillary: 142 mg/dL — ABNORMAL HIGH (ref 70–99)

## 2010-08-24 LAB — CK TOTAL AND CKMB (NOT AT ARMC): Total CK: 78 U/L (ref 7–177)

## 2010-08-24 LAB — CBC
HCT: 37.8 % (ref 36.0–46.0)
Hemoglobin: 12.9 g/dL (ref 12.0–15.0)
MCHC: 34.1 g/dL (ref 30.0–36.0)

## 2010-08-24 LAB — URINALYSIS, ROUTINE W REFLEX MICROSCOPIC
Bilirubin Urine: NEGATIVE
Glucose, UA: NEGATIVE mg/dL
Hgb urine dipstick: NEGATIVE
Specific Gravity, Urine: 1.018 (ref 1.005–1.030)
pH: 7 (ref 5.0–8.0)

## 2010-08-25 ENCOUNTER — Observation Stay (HOSPITAL_COMMUNITY): Payer: Medicare Other

## 2010-08-25 ENCOUNTER — Encounter (HOSPITAL_COMMUNITY): Payer: Self-pay | Admitting: Radiology

## 2010-08-25 LAB — GLUCOSE, CAPILLARY: Glucose-Capillary: 146 mg/dL — ABNORMAL HIGH (ref 70–99)

## 2010-08-25 LAB — DIFFERENTIAL
Basophils Absolute: 0 10*3/uL (ref 0.0–0.1)
Lymphocytes Relative: 16 % (ref 12–46)
Lymphs Abs: 0.7 10*3/uL (ref 0.7–4.0)
Neutro Abs: 3 10*3/uL (ref 1.7–7.7)
Neutrophils Relative %: 68 % (ref 43–77)

## 2010-08-25 LAB — COMPREHENSIVE METABOLIC PANEL
ALT: 315 U/L — ABNORMAL HIGH (ref 0–35)
AST: 200 U/L — ABNORMAL HIGH (ref 0–37)
Alkaline Phosphatase: 120 U/L — ABNORMAL HIGH (ref 39–117)
CO2: 28 mEq/L (ref 19–32)
Calcium: 9 mg/dL (ref 8.4–10.5)
Chloride: 98 mEq/L (ref 96–112)
GFR calc non Af Amer: >60 mL/min (ref >60)
Potassium: 3.6 mEq/L (ref 3.5–5.1)
Sodium: 132 mEq/L — ABNORMAL LOW (ref 135–145)
Total Bilirubin: 0.7 mg/dL (ref 0.3–1.2)

## 2010-08-25 LAB — CBC
HCT: 31 % — ABNORMAL LOW (ref 36.0–46.0)
Hemoglobin: 10.7 g/dL — ABNORMAL LOW (ref 12.0–15.0)
MCV: 90.4 fL (ref 78.0–100.0)
RBC: 3.43 MIL/uL — ABNORMAL LOW (ref 3.87–5.11)
WBC: 4.4 10*3/uL (ref 4.0–10.5)

## 2010-08-25 LAB — APTT: aPTT: 31 seconds (ref 24–37)

## 2010-08-25 LAB — PHOSPHORUS: Phosphorus: 3.1 mg/dL (ref 2.3–4.6)

## 2010-08-25 MED ORDER — IOHEXOL 300 MG/ML  SOLN
100.0000 mL | Freq: Once | INTRAMUSCULAR | Status: AC | PRN
  Administered 2010-08-25: 100 mL via INTRAVENOUS

## 2010-08-26 LAB — COMPREHENSIVE METABOLIC PANEL
ALT: 206 U/L — ABNORMAL HIGH (ref 0–35)
Alkaline Phosphatase: 110 U/L (ref 39–117)
CO2: 29 mEq/L (ref 19–32)
Calcium: 9.4 mg/dL (ref 8.4–10.5)
GFR calc Af Amer: >60 mL/min (ref >60)
GFR calc non Af Amer: >60 mL/min (ref >60)
Glucose, Bld: 137 mg/dL — ABNORMAL HIGH (ref 70–99)
Potassium: 3.9 mEq/L (ref 3.5–5.1)
Sodium: 134 mEq/L — ABNORMAL LOW (ref 135–145)
Total Bilirubin: 0.4 mg/dL (ref 0.3–1.2)

## 2010-08-26 LAB — CBC
Hemoglobin: 11 g/dL — ABNORMAL LOW (ref 12.0–15.0)
MCH: 30.7 pg (ref 26.0–34.0)
MCHC: 34 g/dL (ref 30.0–36.0)

## 2010-08-26 LAB — CARDIAC PANEL(CRET KIN+CKTOT+MB+TROPI)
Total CK: 42 U/L (ref 7–177)
Troponin I: <0.3 ng/mL (ref <0.30)

## 2010-08-27 LAB — GLUCOSE, CAPILLARY
Glucose-Capillary: 231 mg/dL — ABNORMAL HIGH (ref 70–99)
Glucose-Capillary: 134 mg/dL — ABNORMAL HIGH (ref 70–99)
Glucose-Capillary: 132 mg/dL — ABNORMAL HIGH (ref 70–99)
Glucose-Capillary: 184 mg/dL — ABNORMAL HIGH (ref 70–99)

## 2010-08-28 LAB — CBC
Hemoglobin: 11.7 g/dL — ABNORMAL LOW (ref 12.0–15.0)
MCH: 30.9 pg (ref 26.0–34.0)
MCV: 89.4 fL (ref 78.0–100.0)
RBC: 3.79 MIL/uL — ABNORMAL LOW (ref 3.87–5.11)

## 2010-08-28 LAB — COMPREHENSIVE METABOLIC PANEL
ALT: 105 U/L — ABNORMAL HIGH (ref 0–35)
CO2: 28 mEq/L (ref 19–32)
Calcium: 9.7 mg/dL (ref 8.4–10.5)
Chloride: 101 mEq/L (ref 96–112)
Creatinine, Ser: 0.69 mg/dL (ref 0.50–1.10)
GFR calc Af Amer: >60 mL/min (ref >60)
GFR calc non Af Amer: >60 mL/min (ref >60)
Glucose, Bld: 156 mg/dL — ABNORMAL HIGH (ref 70–99)
Sodium: 136 mEq/L (ref 135–145)
Total Bilirubin: 0.3 mg/dL (ref 0.3–1.2)

## 2010-08-28 LAB — GLUCOSE, CAPILLARY

## 2010-08-29 NOTE — Discharge Summary (Signed)
[name], [name]                ACCOUNT NO.:  [id number]  MEDICAL RECORD NO.:  [id number]  LOCATION:  1419                         FACILITY:  [facility]  PHYSICIAN:  Andreas Blower, MD       DATE OF BIRTH:  [birth year]  DATE OF ADMISSION:  08/24/2010 DATE OF DISCHARGE:                              DISCHARGE SUMMARY   PRIMARY CARE PHYSICIAN:  Marjory Lies, M.D.  CARDIOLOGIST:  Thurmon Fair, MD  NEUROLOGIST:  Pramod P. Pearlean Brownie, MD.  ONCOLOGIST:  Pierce Crane, M.D., F.R.C.P.C.  DISCHARGE DIAGNOSES: 1. Nausea, vomiting and diarrhea, resolved. 2. Elevated liver function tests, improved. 3. Type 2 diabetes. 4. History of vertigo. 5. Hypertension. 6. Hyperlipidemia. 7. History of breast cancer, was on Femara, currently held. 8. History of cerebrovascular accident. 9. Morbid obesity. 10.History of coronary disease with non-ST-elevation MI and left     bundle-branch block. 11.History of middle cerebral artery stenosis, status post stenting. 12.History of depression.  DISCHARGE MEDICATIONS: 1. Aspirin 325 mg p.o. daily. 2. Citalopram 10 mg p.o. daily. 3. Calcium 630 mg over-the-counter 1 tablet p.o. daily. 4. Fish oil 2000 mg p.o. daily. 5. Glipizide XL 10 mg p.o. twice daily. 6. Hydrochlorothiazide 25 mg p.o. daily. 7. Lantus 5 units subcu daily at bedtime. 8. Lisinopril 10 mg p.o. daily at bedtime. 9. Magnesium 100 mg p.o. daily. 10.Multivitamin over-the-counter 1 tablet p.o. daily. 11.Promethazine 12.5 mg p.o. q.6h. as needed for nausea. 12.Tylenol 650 mg p.o. q.6h. as needed for pain. 13.Vitamin B12 2500 mcg p.o. daily. 14.Vitamin D 2000 units p.o. daily. 15.Meclizine 25 mg p.o. 3 times a day as needed for dizziness. 16.Metformin 5 mg p.o. twice daily.  BRIEF ADMITTING HISTORY AND PHYSICAL:  Ms. Sherpa is a 76 year old Caucasian female with history of diabetes, CVA, hypertension, breast cancer, who was recently admitted about 1 month ago for nausea and vomiting.  The  patient comes at present complaining about nausea, vomiting with mild abdominal pain that started this morning at 6 o'clock.  RADIOLOGY AND IMAGING:  The patient had acute abdominal series which shows no acute abdominal abnormalities apart from heartburn and constipation.  Stable mild cardiomegaly and chronic linear scarring or atelectasis in the left lung base.  No acute cardiopulmonary disease. The patient had abdominal ultrasound which showed development of biliary ductal dilatation.  Cannot exclude distal common duct stone or less likely obstructing mass.  Possible mild hepatosteatosis.  The patient had CT of the abdomen, pelvis with contrast which shows no clear abdominal etiology or pelvic process.  Biliary ductal dilatation and common bile duct dilatation are similar to prior MRI and likely related to prior cholecystectomy.  Cyst lesion within the pancreas is only slightly enlarged over a 4-year interval, suggested benign etiology. Extensive sigmoid diverticulosis without clear evidence of acute diverticulitis.  LABORATORY DATA:  CBC shows a white count of 3.5, hemoglobin 11.7, hematocrit 33.9, platelet count 174,000.  Electrolytes normal with a BUN of 8, creatinine 0.69.  Liver function tests at the time of discharge were normal, except ALT is 105, albumin is 3.1.  Initial liver function tests on admission showed alk phos was 191, AST is 622, ALT is 619. Troponins negative x3.  UA  was negative for nitrites and leukocytes. Admission lipase was 13.  HOSPITAL COURSE BY PROBLEM: 1. Nausea, vomiting resolved during the course of hospital stay.     Etiology unclear.  The patient has had an ultrasound as well as CT     of abdomen and pelvis. Uncertain if the patient has had biliary     pathology that is causing elevation in her LFTs and causing the     patient to have nausea, vomiting.  Her liver function tests have     been trending down during the course of the hospital stay.   The     patient has had above imaging.  Instructed the patient to     follow up with primary care physician and have the primary     care physician refer the patient to a gastroenterologist for     further evaluation.  Uncertain if the passed a small stone     with resolution of her symptoms, though she has had     cholecystectomy. Also uncertain if the patient also has a     biliary sludging which with improved, which may have caused     improvement in her symptoms. 2. Elevated liver function tests, etiology unclear.  The patient has     had a hepatitis panel done on her previous hospitalization which     was negative.  Further management as outpatient, will defer to her     primary care physician to refer the patient to gastroenterologist     for further workup. 3. Type 2 diabetes.  Will resume Lantus. 4. History of vertigo.  The patient will resume outpatient physical     therapy for balanced gait at discharge.  We will continue p.r.n.     meclizine.  The patient was evaluated by physical therapy who     recommended outpatient physical therapy. 5. Hypertension.  Will resume her home medications for discharge. 6. Hyperlipidemia, stable. 7. History of breast cancer, was on Femara which was held due to     elevated liver function tests.  The patient was instructed to     follow with Dr. Donnie Coffin, her oncologist, to determine when to resume     Femara. 8. History of CVA, stable. 9. Morbid obesity.  Diet and exercise as an outpatient.  DISPOSITION AND FOLLOWUP:  The patient to follow up with Dr. Marjory Lies, her primary care physician, in 1 week.  Time spent on discharge, talking to the patient and coordinating care was 35 minutes.   Andreas Blower, MD   SR/MEDQ  D:  08/28/2010  T:  08/28/2010  Job:  [id number]  Electronically Signed by Wardell [place] Emelyn Roen  on 08/29/2010 08:35:22 PM

## 2010-09-04 NOTE — H&P (Signed)
[name], [name]                ACCOUNT NO.:  [id number]  MEDICAL RECORD NO.:  [id number]  LOCATION:  1419                         FACILITY:  [facility]  PHYSICIAN:  Kathlen Mody, MD       DATE OF BIRTH:  25-Oct-1934  DATE OF ADMISSION:  08/24/2010 DATE OF DISCHARGE:                             HISTORY & PHYSICAL   PRIMARY CARE PHYSICIAN:  Marjory Lies, MD  CARDIOLOGIST:  Thurmon Fair, MD  NEUROLOGIST:  Pramod P. Pearlean Brownie, MD  CHIEF COMPLAINT:  Nausea, vomiting since morning.  HISTORY OF PRESENT ILLNESS:  This is a 76 year old lady with history of diabetes, CVA, hypertension, history of breast cancer, who was recently admitted about a month ago for similar complaints, comes in today complaining of nausea and vomiting which started at 6 o'clock this morning, persistent, no hematemesis, mild abdominal pain.  Abdominal pain is crampy, lower quadrant.  The patient also reports loose bowel movements yesterday, about 2 episodes.  No bleeding per rectum.  Denies any fevers.  Denies any sick contacts.  Denies any history of travel. The patient denies any history of recent antibiotic use.  The patient denies any chest pain, shortness of breath, palpitations or syncope. The patient has a history of chronic vertigo.  The patient has mild dizziness.  Denies any headache, blurry vision.  Denies any tingling or numbness in her extremities.  The patient is also complaining of generalized weakness.  PAST MEDICAL HISTORY: 1. Chronic vertigo. 2. Chronic hyponatremia. 3. Diabetes. 4. Hypertension. 5. Hyperlipidemia. 6. Left bundle branch block. 7. History of mild atherosclerosis. 8. History of elevated liver enzymes of unknown etiology. 9. History of breast cancer. 10.History of CVA. 11.History of MCA artery stenosis, status post stenting. 12.Morbid obesity. 13.History of depression. 14.History of familial tremor.  PAST SURGICAL HISTORY: 1. History of cholecystectomy. 2. Status post  lumpectomy with breast cancer. 3. Status post thyroidectomy. 4. Status post total knee replacement. 5. Stenting of her right MCA.  HOME MEDICATIONS:  Please see medication reconciliation for detailed medications and their doses.  SOCIAL HISTORY:  The patient lives at home by herself.  She denies any smoking, EtOH or recreational drug abuse.  FAMILY HISTORY:  Coronary artery disease in the mother, colon cancer in father.  ALLERGIES: 1. OXYCODONE. 2. HMG-CoA REDUCTASE INHIBITORS.  PHYSICAL EXAMINATION:  VITAL SIGNS:  The patient has a temperature of 98.4, pulse of 89, respirations 16, blood pressure 114/63, saturating 92% to 94% on 2 L of nasal cannula. GENERAL:  She is alert, afebrile, comfortable, no acute distress. HEENT:  Pupils equally reactive to light and accommodation.  Dry mucous membranes. NECK:  No JVD. CARDIOVASCULAR:  S1, S2 heard.  Regular rate and rhythm. RESPIRATORY:  Chest clear to auscultation bilaterally.  No wheezing or rhonchi. ABDOMEN:  Soft, nontender and nondistended.  Bowel sounds are heard.  No organomegaly felt. EXTREMITIES:  Pedal edema present. NEUROLOGIC:  The patient is able to move all her extremities.  PERTINENT LABORATORY AND X-RAY DATA:  On admission, the patient had a CBC significant for a platelet count of 147.  Comprehensive metabolic panel significant for a sodium of 132, chloride 93, glucose 212. Alkaline phosphatase of 191, AST  of 622, ALT of 690.  CK-MB and troponin within normal limits.  Urinalysis negative for nitrites and leukocytes.  Acute abdominal series shows no acute abdominal abnormality, possible constipation, mild cardiomegaly, and chronic linear scarring.  No acute cardiopulmonary disease.  ASSESSMENT AND PLAN:  This is a 76 year old lady with history of hypertension, diabetes, CVA, vertigo, who came in complaining of nausea, vomiting, mild abdominal pain and 2 episodes of diarrhea, most likely viral gastroenteritis.   The patient was orthostatic while in the ER.  On admission, the patient got 500 mL bolus of normal saline.  Will start her on IV fluids, normal saline 80 mL/hour and we will start her on clear liquid diet, IV Zofran 4 mg q.4 h as needed.  If she has any more episodes of diarrhea, will send stool for WBCs to see if she has any infection.  Constipation:  Will start the patient on Colace and Dulcolax and if she does not have a bowel movement in the next 24 hours, will also start her on MiraLAX and advance it to Fleet's enema.  Elevated liver enzymes:  The patient has a history of elevated liver enzymes in the past.  Her hepatitis profile has been negative.  Elevated liver enzymes could be secondary to dehydration.  Will repeat the LFTs in the morning after adequate hydration.  Will also get ultrasound of the abdomen to look for any acute changes.  Hyponatremia, most likely secondary to dehydration.  Continue with the IV fluids.  Repeat electrolytes in the morning.  Hypertension, blood pressure parameters running on the low side.  We will hold the blood pressure medications for a couple of days until blood pressure improves.Diabetes:  The patient is on glipizide and metformin.  We will hold the oral hypoglycemics for now.  We will put her on insulin sliding scale and continue with Lantus of 5 units.  Cerebrovascular accident:  Continue with Plavix 75 mg daily.  Depression:  Continue with Celexa 10 mg daily.  For deep venous thrombosis prophylaxis, sequential compressive devices.  The patient is full code.          ______________________________ Kathlen Mody, MD     VA/MEDQ  D:  08/24/2010  T:  08/24/2010  Job:  [id number]  Electronically Signed by Kathlen Mody MD on 09/04/2010 02:28:35 AM

## 2010-09-07 ENCOUNTER — Encounter (HOSPITAL_BASED_OUTPATIENT_CLINIC_OR_DEPARTMENT_OTHER): Payer: Medicare Other | Admitting: Oncology

## 2010-09-07 ENCOUNTER — Other Ambulatory Visit: Payer: Self-pay | Admitting: Oncology

## 2010-09-07 DIAGNOSIS — C50419 Malignant neoplasm of upper-outer quadrant of unspecified female breast: Secondary | ICD-10-CM

## 2010-09-07 DIAGNOSIS — Z170 Estrogen receptor positive status [ER+]: Secondary | ICD-10-CM

## 2010-09-07 LAB — HEPATIC FUNCTION PANEL
AST: 265 U/L — ABNORMAL HIGH (ref 0–37)
Albumin: 4.1 g/dL (ref 3.5–5.2)
Alkaline Phosphatase: 218 U/L — ABNORMAL HIGH (ref 39–117)
Indirect Bilirubin: 0.5 mg/dL (ref 0.0–0.9)
Total Bilirubin: 0.7 mg/dL (ref 0.3–1.2)

## 2010-09-17 ENCOUNTER — Inpatient Hospital Stay (HOSPITAL_COMMUNITY)
Admission: AD | Admit: 2010-09-17 | Discharge: 2010-09-20 | DRG: 392 | Disposition: A | Discharge status: Home / Self Care | Payer: Medicare Other | Source: Other Acute Inpatient Hospital | Attending: Internal Medicine | Admitting: Internal Medicine

## 2010-09-17 DIAGNOSIS — E785 Hyperlipidemia, unspecified: Secondary | ICD-10-CM | POA: Diagnosis present

## 2010-09-17 DIAGNOSIS — E871 Hypo-osmolality and hyponatremia: Secondary | ICD-10-CM | POA: Diagnosis present

## 2010-09-17 DIAGNOSIS — Z794 Long term (current) use of insulin: Secondary | ICD-10-CM

## 2010-09-17 DIAGNOSIS — R112 Nausea with vomiting, unspecified: Principal | ICD-10-CM | POA: Diagnosis present

## 2010-09-17 DIAGNOSIS — Z853 Personal history of malignant neoplasm of breast: Secondary | ICD-10-CM

## 2010-09-17 DIAGNOSIS — I959 Hypotension, unspecified: Secondary | ICD-10-CM | POA: Diagnosis present

## 2010-09-17 DIAGNOSIS — Z9010 Acquired absence of unspecified breast and nipple: Secondary | ICD-10-CM

## 2010-09-17 DIAGNOSIS — I498 Other specified cardiac arrhythmias: Secondary | ICD-10-CM | POA: Diagnosis present

## 2010-09-17 DIAGNOSIS — Z7982 Long term (current) use of aspirin: Secondary | ICD-10-CM

## 2010-09-17 DIAGNOSIS — Z8673 Personal history of transient ischemic attack (TIA), and cerebral infarction without residual deficits: Secondary | ICD-10-CM

## 2010-09-17 DIAGNOSIS — R932 Abnormal findings on diagnostic imaging of liver and biliary tract: Secondary | ICD-10-CM

## 2010-09-17 DIAGNOSIS — E119 Type 2 diabetes mellitus without complications: Secondary | ICD-10-CM | POA: Diagnosis present

## 2010-09-17 DIAGNOSIS — I10 Essential (primary) hypertension: Secondary | ICD-10-CM | POA: Diagnosis present

## 2010-09-17 DIAGNOSIS — Z01812 Encounter for preprocedural laboratory examination: Secondary | ICD-10-CM

## 2010-09-17 DIAGNOSIS — R7402 Elevation of levels of lactic acid dehydrogenase (LDH): Secondary | ICD-10-CM | POA: Diagnosis present

## 2010-09-17 DIAGNOSIS — R7401 Elevation of levels of liver transaminase levels: Secondary | ICD-10-CM | POA: Diagnosis present

## 2010-09-17 DIAGNOSIS — R945 Abnormal results of liver function studies: Secondary | ICD-10-CM

## 2010-09-17 DIAGNOSIS — K839 Disease of biliary tract, unspecified: Secondary | ICD-10-CM | POA: Diagnosis present

## 2010-09-17 LAB — COMPREHENSIVE METABOLIC PANEL
ALT: 1107 U/L — ABNORMAL HIGH (ref 0–35)
Alkaline Phosphatase: 212 U/L — ABNORMAL HIGH (ref 39–117)
BUN: 19 mg/dL (ref 6–23)
CO2: 25 mEq/L (ref 19–32)
Chloride: 88 mEq/L — ABNORMAL LOW (ref 96–112)
GFR calc Af Amer: >60 mL/min (ref >60)
Glucose, Bld: 224 mg/dL — ABNORMAL HIGH (ref 70–99)
Potassium: 4.6 mEq/L (ref 3.5–5.1)
Sodium: 125 mEq/L — ABNORMAL LOW (ref 135–145)
Total Bilirubin: 0.8 mg/dL (ref 0.3–1.2)

## 2010-09-17 LAB — CBC
HCT: 36.2 % (ref 36.0–46.0)
Hemoglobin: 13.1 g/dL (ref 12.0–15.0)
MCH: 31.8 pg (ref 26.0–34.0)
MCHC: 36.2 g/dL — ABNORMAL HIGH (ref 30.0–36.0)
MCV: 87.9 fL (ref 78.0–100.0)
RBC: 4.12 MIL/uL (ref 3.87–5.11)

## 2010-09-17 LAB — BASIC METABOLIC PANEL
BUN: 20 mg/dL (ref 6–23)
Chloride: 90 mEq/L — ABNORMAL LOW (ref 96–112)
GFR calc Af Amer: >60 mL/min (ref >60)
GFR calc non Af Amer: 59 mL/min — ABNORMAL LOW (ref >60)
Glucose, Bld: 225 mg/dL — ABNORMAL HIGH (ref 70–99)
Potassium: 4.4 mEq/L (ref 3.5–5.1)
Sodium: 127 mEq/L — ABNORMAL LOW (ref 135–145)

## 2010-09-17 LAB — GLUCOSE, CAPILLARY: Glucose-Capillary: 114 mg/dL — ABNORMAL HIGH (ref 70–99)

## 2010-09-17 LAB — AMYLASE: Amylase: 38 U/L (ref 0–105)

## 2010-09-17 NOTE — H&P (Signed)
[name], [name]                ACCOUNT NO.:  [id number]  MEDICAL RECORD NO.:  [id number]  LOCATION:  4738                         FACILITY:  [facility]  PHYSICIAN:  Hilary Hertz, MD      DATE OF BIRTH:  26-Sep-1934  DATE OF ADMISSION:  09/17/2010 DATE OF DISCHARGE:                             HISTORY & PHYSICAL   PRIMARY CARE PHYSICIAN:  Marjory Lies, MD  PRIMARY GI PHYSICIAN:  Jordan Hawks. Elnoria Howard, MD  CHIEF COMPLAINT:  Nausea and vomiting.  HISTORY OF PRESENT ILLNESS:  The patient is a 76 year old woman with extensive past medical history including diabetes, hypertension, hyperlipidemia, elevated LFTs of unknown etiology, breast cancer status post mastectomy and lumpectomy, CVA, MCA stenosis status post stenting, and morbid obesity who presents with nausea and vomiting this morning. The patient has had two prior admissions, one in May 2012 and one in June 2012 for similar symptoms and the etiology of her symptoms is yet to be elucidated.  She reports that she saw Dr. Elnoria Howard in clinic and was supposed to be undergoing an upper GI endoscopy later this week, however, she awoke with symptoms again this morning.  The patient reports she was normal yesterday, was doing fine, woke up this morning with severe nausea and vomited 20 times, was unable to keep anything down.  The symptoms started at 4:30 this morning.  She did not eat anything out of the ordinary yesterday.  Has had no sick contacts.  She reports some "abdominal soreness", however, no frank abdominal pain. She has not had any diarrhea.  She denies any blood in her vomit.  The last bowel movement she had was yesterday and it was normal.  She has not been constipated.  She has not had any fevers.  At this time, she just feels tired and is nauseated.  She initially went to the emergency department in [place] and was transferred here.  Per verbal report from the accepting hospitalist, the patient had signs of  ductal dilatation on abdominal CT done over there.  There was no report sent of that.  She also reportedly had ALT up to 1200 and elevated lipase, however, again there were no records sent for these laboratory tests. The patient attempted to take promethazine, which she had at home for nausea, however, this was not able to relieve her symptoms.  REVIEW OF SYSTEMS:  Review of 10-organ systems was done and is negative except as stated above in the HPI.  ALLERGIES:  STATINS AND OXYCONTIN TO WHICH THE PATIENT GETS NAUSEOUS.  MEDICATIONS:  Per the patient's home medication list, 1. Metformin 500 mg twice daily. 2. Glipizide 10 mg twice a day. 3. Propranolol 40 mg daily. 4. Hydrochlorothiazide 25 mg at night. 5. Citalopram 10 mg daily. 6. Calcium supplement daily. 7. Lantus 5 units at night. 8. Multivitamin. 9. Vitamin D 2000 units daily. 10.Vitamin B12 2500 mcg daily. 11.Magnesium 500 mg daily. 12.Zetia 10 mg daily. 13.Lisinopril 10 mg at night. 14.Fish oil 2000 mg daily. 15.The patient is also noted to be on aspirin 325 mg per the last     discharge summary and she reports that she is taking this (this was not on  her home med list).  PAST MEDICAL/SURGICAL HISTORY:  Chronic vertigo, hyponatremia, recurrent nausea, diabetes, hypertension, hyperlipidemia, left bundle-branch block, cardiac catheterization in May 2012 showing normal coronary arteries, elevated liver enzymes of unknown etiology, breast cancer status post right mastectomy and left lumpectomy.  The patient is on Femara per her oncologist, however, this was held on last admission due to her elevated LFTs.  It is unclear if this has been restarted.  MCA artery stenosis status post stent, CVA, morbid obesity, depression, familial tremor, cholecystectomy, thyroidectomy, total knee replacement.  SOCIAL HISTORY:  Lives alone.  Nonsmoker.  No alcohol.  FAMILY HISTORY:  Mother with coronary artery disease.  Father with  colon cancer.  PHYSICAL EXAMINATION:  VITAL SIGNS:  On transfer, blood pressure 132/65, heart rate of 82, respiratory rate 20, satting 95% on room air exam. GENERAL:  The patient is in no acute distress. HEENT:  Dry mucous membranes. CV:  Regular rate and rhythm.  2/6 systolic ejection murmur. LUNGS:  Fine crackles in left base that clear with deep inspiration, otherwise nonlabored respiratory effort.  No wheezes. ABDOMEN:  Obese, soft, nontender, nondistended.  Positive bowel sounds. EXTREMITIES:  No edema. SKIN:  No rashes. NEURO:  Nonfocal. PSYCH:  The patient is A and O x3, however, slow to respond and just states that she feels uncomfortable due to her nausea.  LABORATORY DATA:  There were no copies of laboratory tests done at [facility], so we will repeat some labs here. Prior workup included cardiac catheterization in May 2012 with no coronary artery disease.  CT of abdomen and pelvis on August 25, 2010, no clear acute abdominal or pelvic process, biliary ductal dilatation and common bile duct dilatation are similar to prior MRI and are likely related to prior cholecystectomy, cystic lesion within the pancreas is only slightly enlarged over 4-year interval which suggest a benign etiology, extensive sigmoid diverticulosis without clear evidence of acute diverticulitis.  Abdominal ultrasound on August 24, 2010, development of biliary ductal dilatation cannot exclude distal common bile duct stone or less likely obstructive mass.  Further evaluation with MRCP or a contrast enhanced CT is recommended, possible mild hepatic steatosis.  Acute abdominal series on August 24, 2010, no acute abdominal abnormality, possible constipation, stable mild cardiomegaly and chronic linear scarring or atelectasis of the left lung base.  No acute cardiopulmonary disease.  ASSESSMENT/PLAN:  The patient is a 76 year old woman with multiple medical problems, who has had recurrent  admissions for nausea and vomiting since May, who presents with recurrent nausea and vomiting. 1. Nausea and vomiting.  At this time, the etiology of the patient's     symptoms are unclear.  She was to undergo an EGD with Dr. Elnoria Howard next     week.  At this time, will treat the patient symptomatically with     Zofran and Phenergan.  I have discussed the case with Dr. Christella Hartigan,     who is on-call for Dr. Elnoria Howard with [facility] and they will see the     patient.  will put the patient on maintenance IV fluids to avoid     dehydration.  We will check stat lab to determine if there is any     acute process going on.  Labs will include LFTs, amylase and     lipase, however, it should be noted that the patient does have     chronic elevation of her LFTs. 2. Abnormal liver function tests.  The patient has had elevated liver  function test during prior admissions, however, the etiology of     this is unclear.  She did have an acute hepatitis panel that was     sent in May 2012 that was negative.  She has had liver/abdominal     ultrasound which showed some mild hepatic steatosis and this could     be contributing.  We will hold the patient's Zetia as this can     cause LFT abnormalities.  At her last admission, her medication for     breast cancer was held due to concern for causing elevated LFTs.     We will repeat LFTs as above and GI will see the patient regarding     this issue as well.  One other thing to consider the cause for LFT     abnormalities would be right-sided failure, possibly from pulmonary     hypertension from obstructive sleep apnea or chronic lung disease.     At one of the patient's prior admissions in May 2012, she underwent     cardiac catheterization and was recommended to repeat an echo two     weeks after that, which has not been done, so we will order repeat     echo to evaluate that and also see if there is any alternative     etiology for the transaminitis. 3. Ductal  dilatation.  This was noted on transfer from [place]     and copy of the CT is in the chart for GI's review, however, they     do not send a report accompanying the CT.  At this time it is     unclear if this is an acute process.  She has had recent CT less     than a month ago showing ductal dilatation, which was felt to be     consistent with her prior history of cholecystectomy.  We will     check baseline labs as above and GI can review the abdominal CT. 4. Hypertension.  We will continue the patient's home medications and     hold if her systolic blood pressure is less than 110.  We will hold     her hydrochlorothiazide at this time due to her nausea and vomiting     to avoid dehydration. 5. Diabetes.  We will hold the patient's p.o. diabetes medications and     continue her Lantus and put her on sliding scale insulin. 6. Depression.  Continue the patient's home citalopram. 7. Hyperlipidemia.  Hold Zetia for now for concern of transaminitis as     above.  Hold the patient's fish oil. 8. Prior history of cerebrovascular accident.  We will continue the     patient on home aspirin 325 mg.  She has a history of MCA stent. 9. Breast cancer status post lumpectomy and mastectomy.  The patient     is on a medication called Femara and this was held during her last     admission due to concern for transaminitis.  This was to be     restarted per her oncologist.  At this time, it is unclear if this     is going to be restarted and this can discussed as an outpatient. 10.Fluids, electrolytes, nutrition.  We will put the patient on     maintenance IV fluids at 75 mL/hour.  We will replete electrolytes     as needed and make her n.p.o. for now. 11.Prophylaxis.  We will put  the patient on Lovenox for DVT     prophylaxis. 12.Disposition.  Full code.  We will admit the patient to [facility] 1 with a GI consult for further workup.          ______________________________ Hilary Hertz, MD    JF/MEDQ  D:  09/17/2010  T:  09/17/2010  Job:  [id number]  Electronically Signed by Hilary Hertz MD on 09/17/2010 09:38:17 PM

## 2010-09-17 NOTE — H&P (Signed)
  NAMEYOHANNA, TOW                ACCOUNT NO.:  [id number]  MEDICAL RECORD NO.:  [id number]  LOCATION:  4738                         FACILITY:  [facility]  PHYSICIAN:  Hilary Hertz, MD      DATE OF BIRTH:  03/15/1934  DATE OF ADMISSION:  09/17/2010 DATE OF DISCHARGE:                             HISTORY & PHYSICAL   ADDENDUM: Since the time of the initial dictation, the patient's laboratory data has returned showing a white count of 11.4, hemoglobin of 13.1, platelets of 228,000, amylase of 38, CK 76, MB 1.6, lipase is 19. Magnesium is 2.2.  Sodium is 125, potassium 4.6, chloride 88, bicarb 25, glucose 224, BUN 19, creatinine 0.91, alk phos 212, AST 947, ALT 1107, albumin 3.7, INR is 0.94, and outside records were obtained by GI from [place] showing that the patient's AST, ALT were about in that range and she also had ductal dilatation on the CT. After discussion with GI, it seems as though the ductal dilatation is relatively new finding for the patient and biliary obstruction would fit with the patient's LFTs pattern and ductal dilatation.  They will plan on doing further workup with probably an EUS or an MRCP, ERCP some time tomorrow. In addition, for the patient's hyponatremia, this is likely hypovolemic hyponatremia.  She is on maintenance IV fluids and we will continue to monitor q.4 h. BMET to ensure that the patient's sodium is not correcting too quickly.  At this time, a repeat BMET has shown sodium of 127, so it is correcting appropriately at about 0.5 mEq per hour.  This will need to be closely monitored throughout her hospitalization.          ______________________________ Hilary Hertz, MD     JF/MEDQ  D:  09/17/2010  T:  09/17/2010  Job:  [id number]  Electronically Signed by Hilary Hertz MD on 09/17/2010 10:21:49 PM

## 2010-09-18 LAB — CARDIAC PANEL(CRET KIN+CKTOT+MB+TROPI)
CK, MB: 2.1 ng/mL (ref 0.3–4.0)
Troponin I: <0.3 ng/mL (ref <0.30)

## 2010-09-18 LAB — BASIC METABOLIC PANEL
BUN: 22 mg/dL (ref 6–23)
CO2: 29 mEq/L (ref 19–32)
Calcium: 8.6 mg/dL (ref 8.4–10.5)
Chloride: 97 mEq/L (ref 96–112)
Chloride: 94 mEq/L — ABNORMAL LOW (ref 96–112)
GFR calc Af Amer: >60 mL/min (ref >60)
GFR calc Af Amer: >60 mL/min (ref >60)
GFR calc Af Amer: >60 mL/min (ref >60)
GFR calc non Af Amer: >60 mL/min (ref >60)
Glucose, Bld: 187 mg/dL — ABNORMAL HIGH (ref 70–99)
Potassium: 4.2 mEq/L (ref 3.5–5.1)
Potassium: 4.5 mEq/L (ref 3.5–5.1)
Potassium: 4.5 mEq/L (ref 3.5–5.1)
Sodium: 129 mEq/L — ABNORMAL LOW (ref 135–145)
Sodium: 127 mEq/L — ABNORMAL LOW (ref 135–145)
Sodium: 127 mEq/L — ABNORMAL LOW (ref 135–145)

## 2010-09-18 LAB — URINALYSIS, ROUTINE W REFLEX MICROSCOPIC
Bilirubin Urine: NEGATIVE
Ketones, ur: NEGATIVE mg/dL
Leukocytes, UA: NEGATIVE
Nitrite: NEGATIVE
Specific Gravity, Urine: 1.044 — ABNORMAL HIGH (ref 1.005–1.030)
Urobilinogen, UA: 0.2 mg/dL (ref 0.0–1.0)
pH: 5.5 (ref 5.0–8.0)

## 2010-09-18 LAB — COMPREHENSIVE METABOLIC PANEL
ALT: 700 U/L — ABNORMAL HIGH (ref 0–35)
AST: 454 U/L — ABNORMAL HIGH (ref 0–37)
Albumin: 3.1 g/dL — ABNORMAL LOW (ref 3.5–5.2)
Calcium: 8.7 mg/dL (ref 8.4–10.5)
Creatinine, Ser: 1 mg/dL (ref 0.50–1.10)
GFR calc non Af Amer: 54 mL/min — ABNORMAL LOW (ref >60)
Sodium: 129 mEq/L — ABNORMAL LOW (ref 135–145)
Total Protein: 5.7 g/dL — ABNORMAL LOW (ref 6.0–8.3)

## 2010-09-18 LAB — CBC
Hemoglobin: 11.1 g/dL — ABNORMAL LOW (ref 12.0–15.0)
MCH: 31.6 pg (ref 26.0–34.0)
MCHC: 35.1 g/dL (ref 30.0–36.0)
Platelets: 154 10*3/uL (ref 150–400)

## 2010-09-18 LAB — MAGNESIUM: Magnesium: 2.3 mg/dL (ref 1.5–2.5)

## 2010-09-18 LAB — GLUCOSE, CAPILLARY
Glucose-Capillary: 113 mg/dL — ABNORMAL HIGH (ref 70–99)
Glucose-Capillary: 230 mg/dL — ABNORMAL HIGH (ref 70–99)
Glucose-Capillary: 153 mg/dL — ABNORMAL HIGH (ref 70–99)

## 2010-09-18 LAB — TROPONIN I: Troponin I: <0.3 ng/mL (ref <0.30)

## 2010-09-19 ENCOUNTER — Inpatient Hospital Stay (HOSPITAL_COMMUNITY): Payer: Medicare Other

## 2010-09-19 ENCOUNTER — Ambulatory Visit (HOSPITAL_COMMUNITY): Admission: RE | Admit: 2010-09-19 | Payer: Medicare Other | Source: Ambulatory Visit | Admitting: Gastroenterology

## 2010-09-19 ENCOUNTER — Ambulatory Visit (HOSPITAL_COMMUNITY)
Admission: RE | Admit: 2010-09-19 | Discharge: 2010-09-19 | Disposition: A | Discharge status: Home / Self Care | Payer: Medicare Other | Source: Other Acute Inpatient Hospital | Attending: Gastroenterology | Admitting: Gastroenterology

## 2010-09-19 DIAGNOSIS — Z9089 Acquired absence of other organs: Secondary | ICD-10-CM | POA: Insufficient documentation

## 2010-09-19 DIAGNOSIS — R748 Abnormal levels of other serum enzymes: Secondary | ICD-10-CM | POA: Insufficient documentation

## 2010-09-19 DIAGNOSIS — K838 Other specified diseases of biliary tract: Secondary | ICD-10-CM | POA: Insufficient documentation

## 2010-09-19 LAB — BASIC METABOLIC PANEL
BUN: 14 mg/dL (ref 6–23)
BUN: 12 mg/dL (ref 6–23)
BUN: 12 mg/dL (ref 6–23)
CO2: 29 mEq/L (ref 19–32)
Calcium: 8.8 mg/dL (ref 8.4–10.5)
Calcium: 8.9 mg/dL (ref 8.4–10.5)
Chloride: 97 mEq/L (ref 96–112)
Chloride: 96 mEq/L (ref 96–112)
Chloride: 97 mEq/L (ref 96–112)
Creatinine, Ser: 0.79 mg/dL (ref 0.50–1.10)
Creatinine, Ser: 0.8 mg/dL (ref 0.50–1.10)
Creatinine, Ser: 0.81 mg/dL (ref 0.50–1.10)
GFR calc Af Amer: >60 mL/min (ref >60)
GFR calc Af Amer: >60 mL/min (ref >60)
GFR calc Af Amer: >60 mL/min (ref >60)
GFR calc Af Amer: >60 mL/min (ref >60)
GFR calc non Af Amer: >60 mL/min (ref >60)
GFR calc non Af Amer: >60 mL/min (ref >60)
Glucose, Bld: 117 mg/dL — ABNORMAL HIGH (ref 70–99)
Glucose, Bld: 141 mg/dL — ABNORMAL HIGH (ref 70–99)
Potassium: 4.2 mEq/L (ref 3.5–5.1)
Potassium: 4.7 mEq/L (ref 3.5–5.1)
Sodium: 129 mEq/L — ABNORMAL LOW (ref 135–145)
Sodium: 132 mEq/L — ABNORMAL LOW (ref 135–145)

## 2010-09-19 LAB — COMPREHENSIVE METABOLIC PANEL
AST: 162 U/L — ABNORMAL HIGH (ref 0–37)
Albumin: 2.9 g/dL — ABNORMAL LOW (ref 3.5–5.2)
Alkaline Phosphatase: 124 U/L — ABNORMAL HIGH (ref 39–117)
Chloride: 95 mEq/L — ABNORMAL LOW (ref 96–112)
Potassium: 4.5 mEq/L (ref 3.5–5.1)
Total Bilirubin: 0.5 mg/dL (ref 0.3–1.2)
Total Protein: 5.5 g/dL — ABNORMAL LOW (ref 6.0–8.3)

## 2010-09-19 LAB — CBC
HCT: 30.4 % — ABNORMAL LOW (ref 36.0–46.0)
Hemoglobin: 10.7 g/dL — ABNORMAL LOW (ref 12.0–15.0)
MCV: 89.7 fL (ref 78.0–100.0)
RBC: 3.39 MIL/uL — ABNORMAL LOW (ref 3.87–5.11)
WBC: 4.3 10*3/uL (ref 4.0–10.5)

## 2010-09-19 LAB — GLUCOSE, CAPILLARY: Glucose-Capillary: 165 mg/dL — ABNORMAL HIGH (ref 70–99)

## 2010-09-19 LAB — URINE CULTURE: Culture  Setup Time: 201207090500

## 2010-09-19 LAB — CARDIAC PANEL(CRET KIN+CKTOT+MB+TROPI): CK, MB: 2.3 ng/mL (ref 0.3–4.0)

## 2010-09-20 LAB — COMPREHENSIVE METABOLIC PANEL
ALT: 277 U/L — ABNORMAL HIGH (ref 0–35)
AST: 72 U/L — ABNORMAL HIGH (ref 0–37)
Albumin: 3 g/dL — ABNORMAL LOW (ref 3.5–5.2)
Calcium: 9.2 mg/dL (ref 8.4–10.5)
Sodium: 132 mEq/L — ABNORMAL LOW (ref 135–145)
Total Protein: 5.8 g/dL — ABNORMAL LOW (ref 6.0–8.3)

## 2010-09-20 LAB — BASIC METABOLIC PANEL
BUN: 14 mg/dL (ref 6–23)
BUN: 12 mg/dL (ref 6–23)
Calcium: 8.9 mg/dL (ref 8.4–10.5)
Calcium: 8.9 mg/dL (ref 8.4–10.5)
GFR calc non Af Amer: >60 mL/min (ref >60)
GFR calc non Af Amer: >60 mL/min (ref >60)
Glucose, Bld: 120 mg/dL — ABNORMAL HIGH (ref 70–99)
Glucose, Bld: 195 mg/dL — ABNORMAL HIGH (ref 70–99)
Potassium: 4.1 mEq/L (ref 3.5–5.1)
Sodium: 131 mEq/L — ABNORMAL LOW (ref 135–145)

## 2010-09-20 LAB — CBC
MCH: 32 pg (ref 26.0–34.0)
MCHC: 35.5 g/dL (ref 30.0–36.0)
Platelets: 164 10*3/uL (ref 150–400)
RBC: 3.44 MIL/uL — ABNORMAL LOW (ref 3.87–5.11)

## 2010-09-20 LAB — GLUCOSE, CAPILLARY
Glucose-Capillary: 130 mg/dL — ABNORMAL HIGH (ref 70–99)
Glucose-Capillary: 159 mg/dL — ABNORMAL HIGH (ref 70–99)

## 2010-09-20 LAB — CARDIAC PANEL(CRET KIN+CKTOT+MB+TROPI)
Relative Index: INVALID (ref 0.0–2.5)
Troponin I: <0.3 ng/mL (ref <0.30)

## 2010-09-21 NOTE — Discharge Summary (Signed)
[name], [name]                ACCOUNT NO.:  [id number]  MEDICAL RECORD NO.:  [id number]  LOCATION:  4738                         FACILITY:  [facility]  PHYSICIAN:  Valetta Close, M.D.   DATE OF BIRTH:  October 14, 1934  DATE OF ADMISSION:  09/17/2010 DATE OF DISCHARGE:  09/19/2010                              DISCHARGE SUMMARY   DISCHARGE DIAGNOSES: 1. Nausea and vomiting secondary to likely biliary pathology, but with     an endoscopic ultrasound that was negative for stones. 2. Bradycardia with a normal echo followed with Cardiology f/u in place. 3. Diabetes. 4. Hypotension. 5. Transaminitis. 6. Acute hepatic dysfunction with an AST of 947 and ALT 1107 returning     to normal. 7. Hypotension now offered for blood pressure medicines. 8. Hyponatremia mild with a sodium of 131, but it is persisting. 9. History of cerebrovascular accident.  DISCHARGE MEDICATIONS: 1. Zofran 4 mg by mouth every 6 h. as needed for nausea. 2. Aspirin 325 mg by mouth once a day. 3. Citalopram 20 mg half a tablet by mouth once a day. 4. Calcium 630 mg over-the-counter once a day. 5. Glipizide XL 10 mg by mouth twice a day. 6. Lantus 5 units subcu at night.  These are her home medicines. 7. Magnesium 500 mg over-the-counter by mouth once a day. 8. B12 2500 mcg by mouth once a day. 9. Vitamin D2 2000 units by mouth a day. 10.Her hydrochlorothiazide has been stopped. 11.Her lisinopril has been stopped. 12.Her Zetia has been stopped. 13.Her metformin has been stopped. 14.Her propranolol has been stopped. 15.Her hydrochlorothiazide again has been stopped.  DISPOSITION AND FOLLOWUP:  Seemed to follow up with her primary care physician, Marjory Lies in 1-2 weeks that is to followup with this hospitalization, but also follow up on her blood pressure.  Note that, I stopped a majority of her blood pressure medications.  I also stopped her propranolol because of her bradycardia and I stopped her  metformin and her Zetia because of her elevated LFTs, so again I stopped the lot of her medicines.  Some of these maybe resumed gradually, but I will defer that to you.  Please also note to check her hemoglobin A1c. Again, I have stopped her metformin.  Unfortunately, we do not obtain a hemoglobin A1c here.  I also note that she is on both glipizide and Lantus and maybe we can stop the glipizide in favor of Lantus only if she is already on that medication.  If her LFTs returned to normal, so I am going to resume a metformin would be reasonable.  Note that, she was in hospital for elevated transaminitis and nausea and vomiting of unclear etiology.  She is undergoing a GI workup for that.  Dr. Elnoria Howard is going to manage her for that and because of her bradycardia, Cardiology is going to follow her for that at [facility] Vascular.  For brief admission history and physical, [name] is a 76 year old female with a history of including breast cancer, status post mastectomy and lumpectomy who has been in the hospital twice previously for uncontrolled nausea and vomiting, has actually seen Dr. Elnoria Howard for this. Her symptoms returned  and her labs were checked and she was found to have acute transaminitis and she was admitted for management of this.  INITIAL LABORATORY DATA AND VITALS:  Blood pressure 132/65, heart rate 82, respiratory rate 20, O2 sat 95% on room air and her LFTs were elevated as I said previously.  For more detailed history and physical, please refer to the admission dictation by Hilary Hertz.  She had a white count that was 11, hemoglobin 13, platelets 228, lipase 19, magnesium 2.2.  Sodium 125, potassium 4.6, chloride 88, bicarb 25, glucose 224, BUN 19, creatinine 0.91, AST 947 and ALT 1107.  Lipase normal.  For more detailed history and physical, again please refer to the admission dictated by Jillian Foley.  HOSPITAL COURSE BY PROBLEM: 1. Nausea and vomiting with  transaminitis.  GI was consulted and EUS     was undertaken and surprisingly it was negative.  Her symptoms did     gradually improve with supportive care alone.  We kept her offered     for the majority of her blood pressure medications and if symptoms     are resolved it is possible that she has sphincter of Oddi     dysfunction, cannot tape her sure or she may have passed the stone.     Either way since her labs returned in normal and she is feeling     well, it is reasonable to discharge her home and Cardiology and GI     agree, but I will keep her off of any medication that could     possibly affect her liver at this time. 2. Hyponatremia.  Again, I think this is medication related and in     addition to her nausea and vomiting.  I have stopped her     hydrochlorothiazide and lisinopril and I think with a improved     hydration p.o. intake this should hopefully resolve.  There could     also be a contribution from her anti-depression medication, but     again she seems to be stable on that. 3. Bradycardia.  We stopped her propranolol quite as you did here.  We     did 2-D echo that was normal and they are going to follow up as an     outpatient and I think that is reasonable.  Her heart rate on     discharge is 63. 4. Diabetes.  Hemoglobin A1c was not checked unfortunately during this     admission, but her sugars for the most part seemed to have been     okay.  She does have occasional highs, but again her diet has been     kind of fluctuating.  I will continue her on her Lantus and     glipizide, could consider just increasing her Lantus.  I am going     to stop her metformin, however, because of her elevated LFTs.  DISCHARGE LABORATORY DATA AND VITALS:  Temperature 97.7, heart rate 63, respiratory rate 18, blood pressure 102/60 and O2 sat 96% on room air. White count 3.8, hemoglobin 11, hematocrit 31 and platelets 164. Sodium 132, potassium 3.9, chloride 95, bicarb 31, BUN 13,  creatinine 0.8, glucose 108 and calcium 9.2.  An 2-D echo was essentially normal.  This discharge was approximately 30 minutes.     Valetta Close, M.D.     JC/MEDQ  D:  09/20/2010  T:  09/20/2010  Job:  [id number]  cc:   Jordan Hawks. Delta,  MD Thurmon Fair, MD Marjory Lies, M.D.  Electronically Signed by Valetta Close M.D. on 09/21/2010 04:52:37 PM

## 2010-09-23 LAB — CULTURE, BLOOD (ROUTINE X 2)
Culture  Setup Time: 201207082247
Culture: NO GROWTH

## 2010-12-11 LAB — BASIC METABOLIC PANEL
CO2: 30
Chloride: 100
Creatinine, Ser: 0.8
GFR calc Af Amer: >60
Potassium: 3.6

## 2010-12-11 LAB — DIFFERENTIAL
Basophils Relative: 1
Eosinophils Absolute: 0
Eosinophils Relative: 0
Lymphs Abs: 0.8
Monocytes Absolute: 0.5
Monocytes Relative: 9

## 2010-12-11 LAB — CBC
HCT: 39.4
Hemoglobin: 13.5
MCHC: 34.2
MCV: 92.4
RBC: 4.26
WBC: 6.1

## 2010-12-11 LAB — GLUCOSE, CAPILLARY: Glucose-Capillary: 159 — ABNORMAL HIGH

## 2010-12-18 LAB — BASIC METABOLIC PANEL
BUN: 19
CO2: 30
Calcium: 9.6
Creatinine, Ser: 0.84
GFR calc non Af Amer: >60
Glucose, Bld: 219 — ABNORMAL HIGH
Sodium: 139

## 2010-12-18 LAB — CBC
Hemoglobin: 12.7
MCHC: 35
Platelets: 229
RDW: 15.1

## 2010-12-18 LAB — PROTIME-INR: Prothrombin Time: 12.4

## 2010-12-18 LAB — DIFFERENTIAL
Basophils Absolute: 0
Basophils Relative: 1
Monocytes Absolute: 0.4
Neutro Abs: 3.9

## 2010-12-18 LAB — APTT: aPTT: 31

## 2010-12-20 LAB — DIFFERENTIAL
Basophils Relative: 1
Eosinophils Relative: 0
Eosinophils Relative: 1
Lymphocytes Relative: 37
Lymphs Abs: 1.1
Monocytes Relative: 10
Monocytes Relative: 12 — ABNORMAL HIGH
Neutro Abs: 0.7 — ABNORMAL LOW
Neutro Abs: 0.9 — ABNORMAL LOW

## 2010-12-20 LAB — COMPREHENSIVE METABOLIC PANEL
Albumin: 2.8 — ABNORMAL LOW
BUN: 14
Creatinine, Ser: 0.76
GFR calc Af Amer: >60
Total Protein: 5.2 — ABNORMAL LOW

## 2010-12-20 LAB — CBC
HCT: 25.2 — ABNORMAL LOW
HCT: 26.6 — ABNORMAL LOW
Hemoglobin: 9 — ABNORMAL LOW
MCV: 90
MCV: 90
Platelets: 69 — ABNORMAL LOW
RBC: 2.8 — ABNORMAL LOW
RDW: 14.6 — ABNORMAL HIGH
WBC: 2.2 — ABNORMAL LOW

## 2010-12-22 LAB — I-STAT 8, (EC8 V) (CONVERTED LAB)
Acid-Base Excess: 3 — ABNORMAL HIGH
Chloride: 105
Potassium: 3.6
pH, Ven: 7.517 — ABNORMAL HIGH

## 2010-12-22 LAB — POCT HEMOGLOBIN-HEMACUE: Operator id: 12362

## 2011-02-21 ENCOUNTER — Encounter (HOSPITAL_COMMUNITY): Payer: Self-pay

## 2011-03-01 ENCOUNTER — Encounter (HOSPITAL_BASED_OUTPATIENT_CLINIC_OR_DEPARTMENT_OTHER): Payer: Self-pay

## 2011-03-01 ENCOUNTER — Observation Stay (HOSPITAL_BASED_OUTPATIENT_CLINIC_OR_DEPARTMENT_OTHER)
Admission: EM | Admit: 2011-03-01 | Discharge: 2011-03-03 | Disposition: A | Discharge status: Home / Self Care | Payer: Medicare Other | Attending: Internal Medicine | Admitting: Internal Medicine

## 2011-03-01 ENCOUNTER — Other Ambulatory Visit: Payer: Self-pay

## 2011-03-01 ENCOUNTER — Other Ambulatory Visit: Payer: Self-pay | Admitting: Cardiovascular Disease

## 2011-03-01 ENCOUNTER — Emergency Department (INDEPENDENT_AMBULATORY_CARE_PROVIDER_SITE_OTHER): Payer: Medicare Other

## 2011-03-01 DIAGNOSIS — R001 Bradycardia, unspecified: Secondary | ICD-10-CM | POA: Diagnosis present

## 2011-03-01 DIAGNOSIS — I517 Cardiomegaly: Secondary | ICD-10-CM

## 2011-03-01 DIAGNOSIS — E871 Hypo-osmolality and hyponatremia: Principal | ICD-10-CM | POA: Diagnosis present

## 2011-03-01 DIAGNOSIS — E119 Type 2 diabetes mellitus without complications: Secondary | ICD-10-CM | POA: Diagnosis present

## 2011-03-01 DIAGNOSIS — E860 Dehydration: Secondary | ICD-10-CM | POA: Diagnosis present

## 2011-03-01 DIAGNOSIS — R5381 Other malaise: Secondary | ICD-10-CM

## 2011-03-01 DIAGNOSIS — R1110 Vomiting, unspecified: Secondary | ICD-10-CM | POA: Diagnosis present

## 2011-03-01 DIAGNOSIS — M8448XA Pathological fracture, other site, initial encounter for fracture: Secondary | ICD-10-CM | POA: Insufficient documentation

## 2011-03-01 DIAGNOSIS — R112 Nausea with vomiting, unspecified: Secondary | ICD-10-CM | POA: Insufficient documentation

## 2011-03-01 DIAGNOSIS — I498 Other specified cardiac arrhythmias: Secondary | ICD-10-CM | POA: Insufficient documentation

## 2011-03-01 DIAGNOSIS — Z8673 Personal history of transient ischemic attack (TIA), and cerebral infarction without residual deficits: Secondary | ICD-10-CM | POA: Insufficient documentation

## 2011-03-01 DIAGNOSIS — R5383 Other fatigue: Secondary | ICD-10-CM | POA: Insufficient documentation

## 2011-03-01 DIAGNOSIS — R42 Dizziness and giddiness: Secondary | ICD-10-CM

## 2011-03-01 DIAGNOSIS — G319 Degenerative disease of nervous system, unspecified: Secondary | ICD-10-CM

## 2011-03-01 HISTORY — DX: Bradycardia, unspecified: R00.1

## 2011-03-01 LAB — COMPREHENSIVE METABOLIC PANEL
Albumin: 3.7 g/dL (ref 3.5–5.2)
BUN: 15 mg/dL (ref 6–23)
Creatinine, Ser: 0.8 mg/dL (ref 0.50–1.10)
Potassium: 4.3 mEq/L (ref 3.5–5.1)
Total Protein: 6.7 g/dL (ref 6.0–8.3)

## 2011-03-01 LAB — TROPONIN I: Troponin I: <0.3 ng/mL (ref <0.30)

## 2011-03-01 LAB — DIFFERENTIAL
Basophils Relative: 0 % (ref 0–1)
Eosinophils Absolute: 0.1 10*3/uL (ref 0.0–0.7)
Monocytes Absolute: 0.6 10*3/uL (ref 0.1–1.0)
Monocytes Relative: 10 % (ref 3–12)
Neutrophils Relative %: 61 % (ref 43–77)

## 2011-03-01 LAB — CBC
Hemoglobin: 12.3 g/dL (ref 12.0–15.0)
MCH: 31.4 pg (ref 26.0–34.0)
MCHC: 35.9 g/dL (ref 30.0–36.0)

## 2011-03-01 LAB — LIPASE, BLOOD: Lipase: 18 U/L (ref 11–59)

## 2011-03-01 MED ORDER — CEFAZOLIN SODIUM-DEXTROSE 2-3 GM-% IV SOLR
2.0000 g | INTRAVENOUS | Status: AC
  Filled 2011-03-01 (×2): qty 50

## 2011-03-01 MED ORDER — SODIUM CHLORIDE 0.45 % IV SOLN
INTRAVENOUS | Status: DC
  Administered 2011-03-02: 01:00:00 via INTRAVENOUS

## 2011-03-01 MED ORDER — SODIUM CHLORIDE 0.9 % IV SOLN
INTRAVENOUS | Status: DC
  Administered 2011-03-01: 18:00:00 via INTRAVENOUS

## 2011-03-01 MED ORDER — SODIUM CHLORIDE 0.9 % IV SOLN
INTRAVENOUS | Status: DC
  Administered 2011-03-02: 01:00:00 via INTRAVENOUS

## 2011-03-01 MED ORDER — SODIUM CHLORIDE 0.9 % IR SOLN
80.0000 mg | Status: AC
  Filled 2011-03-01: qty 2

## 2011-03-01 MED ORDER — ONDANSETRON HCL 4 MG/2ML IJ SOLN
4.0000 mg | Freq: Once | INTRAMUSCULAR | Status: AC
  Administered 2011-03-01: 4 mg via INTRAVENOUS
  Filled 2011-03-01: qty 2

## 2011-03-01 MED ORDER — CHLORHEXIDINE GLUCONATE 4 % EX LIQD
60.0000 mL | Freq: Once | CUTANEOUS | Status: AC
  Administered 2011-03-02: 4 via TOPICAL
  Filled 2011-03-01 (×2): qty 60

## 2011-03-01 NOTE — ED Provider Notes (Signed)
History     CSN: [id number]  Arrival date & time 03/01/11  1603   First MD Initiated Contact with Patient 03/01/11 1635      Chief Complaint  Patient presents with  . Emesis  . Dizziness    (Consider location/radiation/quality/duration/timing/severity/associated sxs/prior treatment) Patient is a 76 y.o. female presenting with vomiting. The history is provided by the patient.  Emesis  This is a recurrent problem. Pertinent negatives include no abdominal pain, no diarrhea and no headaches.   patient states that she was at the dentist and she developed dizziness and weakness. She vomited. She has a cerebral dizziness weakness and vomiting. Extensive workup. She's had previous strokes, she also has liver issues are worked up "to [employer] and at [facility]. Vagotomy of the sphincter body dysfunction, but they think about maybe a liver problem. She's also had episodes of bradycardia previously and is scheduled for pacemaker next week. She states started on the dental work and she got up became nauseous and felt dizzy. States she felt unsteady. No localized numbness or weakness. Sensation throughout. She states she was taken home and still felt dizzy.  Past Medical History  Diagnosis Date  . Diabetes mellitus     Type 2  . Stroke     TIA per patient history form dated 02/15/10.  . Goiter   . Seizures   . Arthritis   . Dizziness - light-headed   . Family history of breast cancer     Did not specify what member of family per history form dated 02/15/10.  . Menopause   . Cancer     rt. breast ca    Past Surgical History  Procedure Date  . Joint replacement     Knee - ask patient to clarify which knee or both.  . Breast surgery     lumpectomy  . Brain surgery     Stent placement  . Gallbladder surgery   . Mastectomy   . Thyroid surgery     due to Goiter  . Cholecystectomy     No family history on file.  History  Substance Use Topics  . Smoking status: Never Smoker   .  Smokeless tobacco: Not on file  . Alcohol Use: No    OB History    Grav Para Term Preterm Abortions TAB SAB Ect Mult Living                  Review of Systems  Constitutional: Negative for activity change and appetite change.  HENT: Negative for neck stiffness.   Eyes: Negative for pain.  Respiratory: Negative for chest tightness and shortness of breath.   Cardiovascular: Negative for chest pain and leg swelling.  Gastrointestinal: Positive for nausea and vomiting. Negative for abdominal pain and diarrhea.  Genitourinary: Negative for dysuria and flank pain.  Musculoskeletal: Negative for back pain.  Skin: Negative for rash.  Neurological: Positive for dizziness. Negative for syncope, weakness, numbness and headaches.  Psychiatric/Behavioral: Negative for behavioral problems.    Allergies  Oxycontin  Home Medications   Current Outpatient Rx  Name Route Sig Dispense Refill  . ACETAMINOPHEN 500 MG PO TABS Oral Take 1,000 mg by mouth once as needed. For pain     . CALCIUM PO Oral Take 630 mg by mouth daily.      Marland Kitchen VITAMIN D 2000 UNITS PO TABS Oral Take 2,000 Units by mouth daily.      Marland Kitchen CITALOPRAM HYDROBROMIDE 20 MG PO TABS Oral Take 10  mg by mouth daily.      Marland Kitchen VITAMIN B-12 2500 MCG SL SUBL Sublingual Place 1 tablet under the tongue daily.      . OMEGA-3 FATTY ACIDS 1000 MG PO CAPS Oral Take 2 g by mouth daily.      Marland Kitchen GLIPIZIDE ER 10 MG PO TB24 Oral Take 10 mg by mouth 2 (two) times daily.      Marland Kitchen HYDROCHLOROTHIAZIDE 25 MG PO TABS Oral Take 12.5 mg by mouth daily.      . INSULIN GLARGINE 100 UNIT/ML [place] SOLN Subcutaneous Inject 5 Units into the skin daily as needed. For blood sugar under 120     . LETROZOLE 2.5 MG PO TABS Oral Take 2.5 mg by mouth daily.      Marland Kitchen LISINOPRIL 10 MG PO TABS Oral Take 10 mg by mouth every evening.      Marland Kitchen LOPERAMIDE HCL 2 MG PO TABS Oral Take 4 mg by mouth once as needed. For diarrhea     . MAGNESIUM 250 MG PO TABS Oral Take 2 tablets by mouth daily.       Marland Kitchen METFORMIN HCL 500 MG PO TABS Oral Take 500 mg by mouth 2 (two) times daily.     Marland Kitchen PROMETHAZINE HCL 25 MG PO TABS Oral Take 25 mg by mouth every 6 (six) hours as needed. For nausea      . PROPRANOLOL HCL 40 MG PO TABS Oral Take 20 mg by mouth 2 (two) times daily.       BP 98/54  Pulse 57  Temp(Src) 98.5 F (36.9 C) (Oral)  Resp 14  Ht 5\' 6"  (1.676 m)  Wt 220 lb (99.791 kg)  BMI 35.51 kg/m2  SpO2 97%  Physical Exam  Nursing note and vitals reviewed. Constitutional: She is oriented to person, place, and time. She appears well-developed and well-nourished.  HENT:  Head: Normocephalic and atraumatic.  Eyes: EOM are normal. Pupils are equal, round, and reactive to light.  Neck: Normal range of motion. Neck supple.  Cardiovascular: Normal rate and normal heart sounds.   No murmur heard.      Mild bradycardia  Pulmonary/Chest: Effort normal and breath sounds normal. No respiratory distress. She has no wheezes. She has no rales.  Abdominal: Soft. Bowel sounds are normal. She exhibits no distension. There is no tenderness. There is no rebound and no guarding.  Musculoskeletal: Normal range of motion.  Neurological: She is alert and oriented to person, place, and time. No cranial nerve deficit.       Mild nystagmus with tracking gaze  Skin: Skin is warm and dry.  Psychiatric: She has a normal mood and affect. Her speech is normal.    ED Course  Procedures (including critical care time)  Labs Reviewed  CBC - Abnormal; Notable for the following:    HCT 34.3 (*)    All other components within normal limits  COMPREHENSIVE METABOLIC PANEL - Abnormal; Notable for the following:    Sodium 128 (*)    Chloride 91 (*)    Glucose, Bld 127 (*)    GFR calc non Af Amer 71 (*)    GFR calc Af Amer 82 (*)    All other components within normal limits  DIFFERENTIAL  LIPASE, BLOOD  TROPONIN I   Dg Chest 2 View  03/01/2011  *RADIOLOGY REPORT*  Clinical Data: Dizziness and weakness.   CHEST - 2 VIEW  Comparison: Most recent 07/22/2010.  Also 03/18/2009.  Findings: Cardiomegaly.  Tortuous  aorta.  No infiltrates or failure. No acute osseous findings.  Chronic lower thoracic compression fracture, likely T12, with associated kyphosis, appears stable from 2011. There is been a right mastectomy and axillary node dissection.  There is no visible metastatic disease.  IMPRESSION: Cardiomegaly, no definite active process.  Per CMS PQRS reporting requirements (PQRS Measure 24): Given the patient's age of greater than 50 and the fracture site (hip, distal radius, or spine), the patient should be tested for osteoporosis using DXA, and the appropriate treatment considered based on the DXA results.  Original Report Authenticated By: [name], M.D.   Ct Head Wo Contrast  03/01/2011  *RADIOLOGY REPORT*  Clinical Data: Dizziness.  CT HEAD WITHOUT CONTRAST  Technique:  Contiguous axial images were obtained from the base of the skull through the vertex without contrast.  Comparison: 07/21/2010  Findings: There is a stent seen within the right middle cerebral artery (within the M1 segment). There is atrophy and chronic small vessel disease changes. No acute intracranial abnormality. Specifically, no hemorrhage, hydrocephalus, mass lesion, acute infarction, or significant intracranial injury.  No acute calvarial abnormality. Visualized paranasal sinuses and mastoids clear. Orbital soft tissues unremarkable.  IMPRESSION: No acute intracranial abnormality.  Atrophy, chronic microvascular disease.  Original Report Authenticated By: Cyndie Chime, M.D.     1. Bradycardia   2. Dizziness   3. Vomiting      Date: 03/01/2011  Rate: 54  Rhythm: sinus bradycardia  QRS Axis: LAD  Intervals: normal  ST/T Wave abnormalities: normal  Conduction Disutrbances:left bundle branch block  Narrative Interpretation:   Old EKG Reviewed: unchanged    MDM  Patient present with vomiting dizziness generalized  weakness. She history of same before. She has bradycardia for which is going to get pacer 4. She's also had previous strokes she states caused dizziness. She also previous liver problems given her nausea vomiting. States his vomiting is different than the other vomiting she's had before. She's had no chest pain. She still has some nausea here. She scheduled for pacemaker next week. She'll be admitted to the hospital for monitoring. I discussed with Dr. Mikeal Hawthorne and with [facility] part vascular.        Juliet Rude. Rubin Payor, MD 03/01/11 2125

## 2011-03-01 NOTE — ED Notes (Signed)
C/o vomited x 1,dizziness,weakness while at dentist today-denies pain

## 2011-03-01 NOTE — ED Notes (Signed)
Report called to [id number] RN

## 2011-03-01 NOTE — ED Notes (Signed)
Patient ambulated to bathroom with minimal assistance.  

## 2011-03-02 ENCOUNTER — Encounter (HOSPITAL_COMMUNITY): Payer: Self-pay | Admitting: Internal Medicine

## 2011-03-02 DIAGNOSIS — R1110 Vomiting, unspecified: Secondary | ICD-10-CM | POA: Diagnosis present

## 2011-03-02 DIAGNOSIS — E119 Type 2 diabetes mellitus without complications: Secondary | ICD-10-CM | POA: Diagnosis present

## 2011-03-02 DIAGNOSIS — R42 Dizziness and giddiness: Secondary | ICD-10-CM | POA: Diagnosis present

## 2011-03-02 DIAGNOSIS — R001 Bradycardia, unspecified: Secondary | ICD-10-CM | POA: Diagnosis present

## 2011-03-02 LAB — BASIC METABOLIC PANEL
Calcium: 9.5 mg/dL (ref 8.4–10.5)
Chloride: 93 mEq/L — ABNORMAL LOW (ref 96–112)
Creatinine, Ser: 0.89 mg/dL (ref 0.50–1.10)
GFR calc Af Amer: 72 mL/min — ABNORMAL LOW (ref >90)

## 2011-03-02 LAB — PROTIME-INR: Prothrombin Time: 13.2 seconds (ref 11.6–15.2)

## 2011-03-02 LAB — CBC
HCT: 33.4 % — ABNORMAL LOW (ref 36.0–46.0)
Hemoglobin: 11.8 g/dL — ABNORMAL LOW (ref 12.0–15.0)
MCV: 88.8 fL (ref 78.0–100.0)
Platelets: 192 10*3/uL (ref 150–400)
RBC: 3.76 MIL/uL — ABNORMAL LOW (ref 3.87–5.11)
WBC: 4.1 10*3/uL (ref 4.0–10.5)

## 2011-03-02 LAB — GLUCOSE, CAPILLARY: Glucose-Capillary: 129 mg/dL — ABNORMAL HIGH (ref 70–99)

## 2011-03-02 LAB — CREATININE, SERUM: GFR calc Af Amer: 69 mL/min — ABNORMAL LOW (ref >90)

## 2011-03-02 LAB — TSH: TSH: 1.375 u[IU]/mL (ref 0.350–4.500)

## 2011-03-02 MED ORDER — ACETAMINOPHEN 500 MG PO TABS
1000.0000 mg | ORAL_TABLET | Freq: Once | ORAL | Status: AC | PRN
  Filled 2011-03-02: qty 2

## 2011-03-02 MED ORDER — LETROZOLE 2.5 MG PO TABS
2.5000 mg | ORAL_TABLET | Freq: Every day | ORAL | Status: DC
  Administered 2011-03-02 – 2011-03-03 (×2): 2.5 mg via ORAL
  Filled 2011-03-02 (×2): qty 1

## 2011-03-02 MED ORDER — INSULIN GLARGINE 100 UNIT/ML ~~LOC~~ SOLN
5.0000 [IU] | Freq: Every day | SUBCUTANEOUS | Status: DC
  Filled 2011-03-02: qty 3

## 2011-03-02 MED ORDER — HYDROCHLOROTHIAZIDE 25 MG PO TABS
12.5000 mg | ORAL_TABLET | Freq: Every day | ORAL | Status: DC
  Administered 2011-03-02: 12.5 mg via ORAL
  Filled 2011-03-02: qty 0.5

## 2011-03-02 MED ORDER — ONDANSETRON HCL 4 MG PO TABS: 4.0000 mg | ORAL_TABLET | Freq: Four times a day (QID) | ORAL | Status: DC | PRN

## 2011-03-02 MED ORDER — CITALOPRAM HYDROBROMIDE 10 MG PO TABS
10.0000 mg | ORAL_TABLET | Freq: Every day | ORAL | Status: DC
  Administered 2011-03-02 – 2011-03-03 (×2): 10 mg via ORAL
  Filled 2011-03-02 (×2): qty 1

## 2011-03-02 MED ORDER — MAGNESIUM 250 MG PO TABS: 2.0000 | ORAL_TABLET | Freq: Every day | ORAL | Status: DC

## 2011-03-02 MED ORDER — OMEGA-3 FATTY ACIDS 1000 MG PO CAPS
2.0000 g | ORAL_CAPSULE | Freq: Every day | ORAL | Status: DC
  Administered 2011-03-02: 2 g via ORAL
  Filled 2011-03-02: qty 2

## 2011-03-02 MED ORDER — PROMETHAZINE HCL 25 MG PO TABS: 25.0000 mg | ORAL_TABLET | Freq: Four times a day (QID) | ORAL | Status: DC | PRN

## 2011-03-02 MED ORDER — ENOXAPARIN SODIUM 40 MG/0.4ML ~~LOC~~ SOLN
40.0000 mg | SUBCUTANEOUS | Status: DC
  Administered 2011-03-02 – 2011-03-03 (×2): 40 mg via SUBCUTANEOUS
  Filled 2011-03-02 (×2): qty 0.4

## 2011-03-02 MED ORDER — PROPRANOLOL HCL 20 MG PO TABS
20.0000 mg | ORAL_TABLET | Freq: Two times a day (BID) | ORAL | Status: DC
  Administered 2011-03-02 – 2011-03-03 (×3): 20 mg via ORAL
  Filled 2011-03-02 (×4): qty 1

## 2011-03-02 MED ORDER — SODIUM CHLORIDE 0.9 % IV SOLN: INTRAVENOUS | Status: DC

## 2011-03-02 MED ORDER — GLIPIZIDE ER 10 MG PO TB24
10.0000 mg | ORAL_TABLET | Freq: Two times a day (BID) | ORAL | Status: DC
  Administered 2011-03-02 – 2011-03-03 (×3): 10 mg via ORAL
  Filled 2011-03-02 (×5): qty 1

## 2011-03-02 MED ORDER — MAGNESIUM OXIDE 400 MG PO TABS
400.0000 mg | ORAL_TABLET | Freq: Every day | ORAL | Status: DC
  Administered 2011-03-02 – 2011-03-03 (×2): 400 mg via ORAL
  Filled 2011-03-02 (×2): qty 1

## 2011-03-02 MED ORDER — VITAMIN B-12 1000 MCG PO TABS
2500.0000 ug | ORAL_TABLET | Freq: Every day | ORAL | Status: DC
  Administered 2011-03-02 – 2011-03-03 (×2): 2500 ug via ORAL
  Filled 2011-03-02 (×2): qty 1

## 2011-03-02 MED ORDER — VITAMIN B-12 2500 MCG SL SUBL: 1.0000 | SUBLINGUAL_TABLET | Freq: Every day | SUBLINGUAL | Status: DC

## 2011-03-02 MED ORDER — OMEGA-3-ACID ETHYL ESTERS 1 G PO CAPS
2.0000 g | ORAL_CAPSULE | Freq: Every day | ORAL | Status: DC
  Administered 2011-03-03: 2 g via ORAL
  Filled 2011-03-02: qty 2

## 2011-03-02 MED ORDER — LOPERAMIDE HCL 2 MG PO CAPS: 4.0000 mg | ORAL_CAPSULE | Freq: Once | ORAL | Status: AC | PRN

## 2011-03-02 MED ORDER — VITAMIN D3 25 MCG (1000 UNIT) PO TABS
2000.0000 [IU] | ORAL_TABLET | Freq: Every day | ORAL | Status: DC
  Administered 2011-03-02 – 2011-03-03 (×2): 2000 [IU] via ORAL
  Filled 2011-03-02 (×2): qty 2

## 2011-03-02 MED ORDER — METFORMIN HCL 500 MG PO TABS
500.0000 mg | ORAL_TABLET | Freq: Two times a day (BID) | ORAL | Status: DC
  Administered 2011-03-02 – 2011-03-03 (×3): 500 mg via ORAL
  Filled 2011-03-02 (×4): qty 1

## 2011-03-02 MED ORDER — LISINOPRIL 10 MG PO TABS
10.0000 mg | ORAL_TABLET | Freq: Every evening | ORAL | Status: DC
  Administered 2011-03-02: 10 mg via ORAL
  Filled 2011-03-02: qty 1

## 2011-03-02 MED ORDER — HYDROCODONE-ACETAMINOPHEN 5-325 MG PO TABS: 1.0000 | ORAL_TABLET | ORAL | Status: DC | PRN

## 2011-03-02 MED ORDER — ONDANSETRON HCL 4 MG/2ML IJ SOLN: 4.0000 mg | Freq: Four times a day (QID) | INTRAMUSCULAR | Status: DC | PRN

## 2011-03-02 NOTE — H&P (Signed)
[name] is an 76 y.o. female.   Chief Complaint: dizziness, Nausea and Vomiting HPI: 76 YO with recurrent episodes of dizziness and syncope who has had extensive work up and now scheduled for Pacemaker insertion next Thursday by [facility]. Patient went to [facility] with another episode of NV and Dizziness. Unable to keep things down at the time. No abdominal pain, no diarrhea.  Past Medical History  Diagnosis Date  . Diabetes mellitus     Type 2  . Stroke     TIA per patient history form dated 02/15/10.  . Goiter   . Arthritis   . Dizziness - light-headed   . Family history of breast cancer     Did not specify what member of family per history form dated 02/15/10.  . Menopause   . Cancer     rt. breast ca  . Slow heart rate     Past Surgical History  Procedure Date  . Joint replacement     Knee - ask patient to clarify which knee or both.  . Breast surgery     lumpectomy  . Brain surgery     Stent placement  . Gallbladder surgery   . Mastectomy   . Thyroid surgery     due to Goiter  . Cholecystectomy     History reviewed. No pertinent family history. Social History:  reports that she has never smoked. She does not have any smokeless tobacco history on file. She reports that she does not drink alcohol or use illicit drugs.  Allergies:  Allergies  Allergen Reactions  . Oxycontin Nausea And Vomiting  . Crestor (Rosuvastatin Calcium) Other (See Comments)    dizziness  . Statins     Cramping in legs  . Zetia (Ezetimibe) Other (See Comments)    dizziness    Medications Prior to Admission  Medication Dose Route Frequency Provider Last Rate Last Dose  . 0.45 % sodium chloride infusion   Intravenous Continuous Mihai Croitoru 50 mL/hr at 03/02/11 0042    . 0.9 %  sodium chloride infusion   Intravenous Continuous Mihai Croitoru 75 mL/hr at 03/02/11 0043    . ceFAZolin (ANCEF) IVPB 2 g/50 mL premix  2 g Intravenous On Call Mihai Croitoru      . chlorhexidine (HIBICLENS) 4 %  liquid 4 application  60 mL Topical Once Mihai Croitoru      . gentamicin (GARAMYCIN) 80 mg in sodium chloride irrigation 0.9 % 500 mL irrigation  80 mg Irrigation On Call [employer]      . ondansetron (ZOFRAN) injection 4 mg  4 mg Intravenous Once [employer]. Pickering, MD   4 mg at 03/01/11 1739  . DISCONTD: 0.9 %  sodium chloride infusion   Intravenous Continuous Juliet Rude. Rubin Payor, MD 125 mL/hr at 03/01/11 1739     Medications Prior to Admission  Medication Sig Dispense Refill  . Cholecalciferol (VITAMIN D) 2000 UNITS tablet Take 2,000 Units by mouth daily.        . citalopram (CELEXA) 20 MG tablet Take 10 mg by mouth daily.        Marland Kitchen glipiZIDE (GLUCOTROL XL) 10 MG 24 hr tablet Take 10 mg by mouth 2 (two) times daily.        . hydrochlorothiazide (HYDRODIURIL) 25 MG tablet Take 12.5 mg by mouth daily.        Marland Kitchen letrozole (FEMARA) 2.5 MG tablet Take 2.5 mg by mouth daily.        . metFORMIN (  GLUCOPHAGE) 500 MG tablet Take 500 mg by mouth 2 (two) times daily.       . propranolol (INDERAL) 40 MG tablet Take 20 mg by mouth 2 (two) times daily.         Results for orders placed during the hospital encounter of 03/01/11 (from the past 48 hour(s))  CBC     Status: Abnormal   Collection Time   03/01/11  5:25 PM      Component Value Range Comment   WBC 6.2  4.0 - 10.5 (K/uL)    RBC 3.92  3.87 - 5.11 (MIL/uL)    Hemoglobin 12.3  12.0 - 15.0 (g/dL)    HCT 40.9 (*) 81.1 - 46.0 (%)    MCV 87.5  78.0 - 100.0 (fL)    MCH 31.4  26.0 - 34.0 (pg)    MCHC 35.9  30.0 - 36.0 (g/dL)    RDW 91.4  78.2 - 95.6 (%)    Platelets 250  150 - 400 (K/uL)   DIFFERENTIAL     Status: Normal   Collection Time   03/01/11  5:25 PM      Component Value Range Comment   Neutrophils Relative 61  43 - 77 (%)    Neutro Abs 3.8  1.7 - 7.7 (K/uL)    Lymphocytes Relative 28  12 - 46 (%)    Lymphs Abs 1.7  0.7 - 4.0 (K/uL)    Monocytes Relative 10  3 - 12 (%)    Monocytes Absolute 0.6  0.1 - 1.0 (K/uL)    Eosinophils  Relative 2  0 - 5 (%)    Eosinophils Absolute 0.1  0.0 - 0.7 (K/uL)    Basophils Relative 0  0 - 1 (%)    Basophils Absolute 0.0  0.0 - 0.1 (K/uL)   COMPREHENSIVE METABOLIC PANEL     Status: Abnormal   Collection Time   03/01/11  5:25 PM      Component Value Range Comment   Sodium 128 (*) 135 - 145 (mEq/L)    Potassium 4.3  3.5 - 5.1 (mEq/L)    Chloride 91 (*) 96 - 112 (mEq/L)    CO2 28  19 - 32 (mEq/L)    Glucose, Bld 127 (*) 70 - 99 (mg/dL)    BUN 15  6 - 23 (mg/dL)    Creatinine, Ser 2.13  0.50 - 1.10 (mg/dL)    Calcium 9.5  8.4 - 10.5 (mg/dL)    Total Protein 6.7  6.0 - 8.3 (g/dL)    Albumin 3.7  3.5 - 5.2 (g/dL)    AST 17  0 - 37 (U/L)    ALT 10  0 - 35 (U/L)    Alkaline Phosphatase 59  39 - 117 (U/L)    Total Bilirubin 0.5  0.3 - 1.2 (mg/dL)    GFR calc non Af Amer 71 (*) >90 (mL/min)    GFR calc Af Amer 82 (*) >90 (mL/min)   LIPASE, BLOOD     Status: Normal   Collection Time   03/01/11  5:25 PM      Component Value Range Comment   Lipase 18  11 - 59 (U/L)   TROPONIN I     Status: Normal   Collection Time   03/01/11  5:25 PM      Component Value Range Comment   Troponin I <0.30  <0.30 (ng/mL)   GLUCOSE, CAPILLARY     Status: Abnormal   Collection Time   03/01/11  11:24 PM      Component Value Range Comment   Glucose-Capillary 106 (*) 70 - 99 (mg/dL)   APTT     Status: Normal   Collection Time   03/02/11 12:08 AM      Component Value Range Comment   aPTT 29  24 - 37 (seconds)   PROTIME-INR     Status: Normal   Collection Time   03/02/11 12:08 AM      Component Value Range Comment   Prothrombin Time 13.2  11.6 - 15.2 (seconds)    INR 0.98  0.00 - 1.49    GLUCOSE, CAPILLARY     Status: Abnormal   Collection Time   03/02/11  4:14 AM      Component Value Range Comment   Glucose-Capillary 123 (*) 70 - 99 (mg/dL)    Dg Chest 2 View  40/98/1191  *RADIOLOGY REPORT*  Clinical Data: Dizziness and weakness.  CHEST - 2 VIEW  Comparison: Most recent 07/22/2010.  Also  03/18/2009.  Findings: Cardiomegaly.  Tortuous aorta.  No infiltrates or failure. No acute osseous findings.  Chronic lower thoracic compression fracture, likely T12, with associated kyphosis, appears stable from 2011. There is been a right mastectomy and axillary node dissection.  There is no visible metastatic disease.  IMPRESSION: Cardiomegaly, no definite active process.  Per CMS PQRS reporting requirements (PQRS Measure 24): Given the patient's age of greater than 50 and the fracture site (hip, distal radius, or spine), the patient should be tested for osteoporosis using DXA, and the appropriate treatment considered based on the DXA results.  Original Report Authenticated By: [name], M.D.   Ct Head Wo Contrast  03/01/2011  *RADIOLOGY REPORT*  Clinical Data: Dizziness.  CT HEAD WITHOUT CONTRAST  Technique:  Contiguous axial images were obtained from the base of the skull through the vertex without contrast.  Comparison: 07/21/2010  Findings: There is a stent seen within the right middle cerebral artery (within the M1 segment). There is atrophy and chronic small vessel disease changes. No acute intracranial abnormality. Specifically, no hemorrhage, hydrocephalus, mass lesion, acute infarction, or significant intracranial injury.  No acute calvarial abnormality. Visualized paranasal sinuses and mastoids clear. Orbital soft tissues unremarkable.  IMPRESSION: No acute intracranial abnormality.  Atrophy, chronic microvascular disease.  Original Report Authenticated By: Cyndie Chime, M.D.    Review of Systems  Constitutional: Negative.   HENT: Positive for ear pain. Negative for hearing loss and tinnitus.   Eyes: Negative.   Respiratory: Negative.   Cardiovascular: Negative.   Gastrointestinal: Negative.   Genitourinary: Negative.   Musculoskeletal: Negative.   Skin: Negative.   Neurological: Positive for dizziness. Negative for tingling, tremors, sensory change, speech change, focal  weakness, seizures and loss of consciousness.  Psychiatric/Behavioral: Negative.     Blood pressure 124/65, pulse 62, temperature 97.6 F (36.4 C), temperature source Oral, resp. rate 20, height 5\' 6"  (1.676 m), weight 100.8 kg (222 lb 3.6 oz), SpO2 92.00%. Physical Exam  Constitutional: She is oriented to person, place, and time. She appears well-developed and well-nourished.  HENT:  Head: Normocephalic and atraumatic.  Right Ear: External ear normal.  Eyes: Conjunctivae and EOM are normal. Pupils are equal, round, and reactive to light.  Neck: Normal range of motion. Neck supple.  Cardiovascular: Normal rate, regular rhythm and normal heart sounds.   Respiratory: Effort normal and breath sounds normal.  GI: Soft. Bowel sounds are normal.  Musculoskeletal: Normal range of motion.  Neurological: She is alert and  oriented to person, place, and time. She has normal reflexes.  Skin: Skin is warm and dry.  Psychiatric: She has a normal mood and affect.     Assessment/Plan 1. Dizziness/Light headed: [facility] aware will see patient and continuwe with planned care. PT/OT to evaluate patient. 2. NV: Symptomatic treatment 3. DM2: continue with home meds 4. Bradycardia: presumed cause of Syncope/Dizzy spells   Avani Sensabaugh,LAWAL 03/02/2011, 5:30 AM

## 2011-03-02 NOTE — Progress Notes (Signed)
UR chart review completed.  

## 2011-03-02 NOTE — Progress Notes (Signed)
Subjective: patient seen and examined this morning. informs feeling better today  Objective:  Vital signs in last 24 hours:  Filed Vitals:   03/02/11 1305 03/02/11 1306 03/02/11 1310 03/02/11 1724  BP: 152/81 137/83 139/86 158/75  Pulse: 54 58 58   Temp:      TempSrc:      Resp: 18 18 18    Height:      Weight:      SpO2:        Intake/Output from previous day:   Intake/Output Summary (Last 24 hours) at 03/02/11 1801 Last data filed at 03/02/11 1700  Gross per 24 hour  Intake 1396.25 ml  Output   1630 ml  Net -233.75 ml    Physical Exam:  General:elderly female in no acute distress. HEENT: no pallor, no icterus, moist oral mucosa, no JVD, no lymphadenopathy Heart: Normal  s1 &s2  Regular rate and rhythm, without murmurs, rubs, gallops. Lungs: Clear to auscultation bilaterally. Abdomen: Soft, nontender, nondistended, positive bowel sounds. Extremities: No clubbing cyanosis or edema with positive pedal pulses. Neuro: Alert, awake, oriented x3, nonfocal.   Lab Results:  Basic Metabolic Panel:    Component Value Date/Time   NA 127* 03/02/2011 0930   K 4.2 03/02/2011 0930   CL 93* 03/02/2011 0930   CO2 27 03/02/2011 0930   BUN 13 03/02/2011 0930   CREATININE 0.89 03/02/2011 0930   GLUCOSE 134* 03/02/2011 0930   CALCIUM 9.5 03/02/2011 0930   CBC:    Component Value Date/Time   WBC 4.1 03/02/2011 0710   WBC 5.3 04/10/2010 1332   HGB 11.8* 03/02/2011 0710   HGB 13.0 04/10/2010 1332   HCT 33.4* 03/02/2011 0710   HCT 37.0 04/10/2010 1332   PLT 192 03/02/2011 0710   PLT 186 04/10/2010 1332   MCV 88.8 03/02/2011 0710   MCV 92.3 04/10/2010 1332   NEUTROABS 3.8 03/01/2011 1725   NEUTROABS 3.6 04/10/2010 1332   LYMPHSABS 1.7 03/01/2011 1725   LYMPHSABS 1.2 04/10/2010 1332   MONOABS 0.6 03/01/2011 1725   MONOABS 0.3 04/10/2010 1332   EOSABS 0.1 03/01/2011 1725   EOSABS 0.2 04/10/2010 1332   BASOSABS 0.0 03/01/2011 1725   BASOSABS 0.0 04/10/2010 1332    No results  found for this or any previous visit (from the past 240 hour(s)).  Studies/Results: Dg Chest 2 View  03/01/2011  *RADIOLOGY REPORT*  Clinical Data: Dizziness and weakness.  CHEST - 2 VIEW  Comparison: Most recent 07/22/2010.  Also 03/18/2009.  Findings: Cardiomegaly.  Tortuous aorta.  No infiltrates or failure. No acute osseous findings.  Chronic lower thoracic compression fracture, likely T12, with associated kyphosis, appears stable from 2011. There is been a right mastectomy and axillary node dissection.  There is no visible metastatic disease.  IMPRESSION: Cardiomegaly, no definite active process.  Per CMS PQRS reporting requirements (PQRS Measure 24): Given the patient's age of greater than 50 and the fracture site (hip, distal radius, or spine), the patient should be tested for osteoporosis using DXA, and the appropriate treatment considered based on the DXA results.  Original Report Authenticated By: [name], M.D.   Ct Head Wo Contrast  03/01/2011  *RADIOLOGY REPORT*  Clinical Data: Dizziness.  CT HEAD WITHOUT CONTRAST  Technique:  Contiguous axial images were obtained from the base of the skull through the vertex without contrast.  Comparison: 07/21/2010  Findings: There is a stent seen within the right middle cerebral artery (within the M1 segment). There is atrophy and chronic  small vessel disease changes. No acute intracranial abnormality. Specifically, no hemorrhage, hydrocephalus, mass lesion, acute infarction, or significant intracranial injury.  No acute calvarial abnormality. Visualized paranasal sinuses and mastoids clear. Orbital soft tissues unremarkable.  IMPRESSION: No acute intracranial abnormality.  Atrophy, chronic microvascular disease.  Original Report Authenticated By: Cyndie Chime, M.D.    Medications: Scheduled Meds:   . ceFAZolin (ANCEF) IV  2 g Intravenous On Call  . chlorhexidine  60 mL Topical Once  . cholecalciferol  2,000 Units Oral Daily  . citalopram   10 mg Oral Daily  . vitamin B-12  2,500 mcg Oral Daily  . enoxaparin  40 mg Subcutaneous Q24H  . gentamicin irrigation  80 mg Irrigation On Call  . glipiZIDE  10 mg Oral BID  . hydrochlorothiazide  12.5 mg Oral Daily  . insulin glargine  5 Units Subcutaneous Daily  . letrozole  2.5 mg Oral Daily  . lisinopril  10 mg Oral QPM  . magnesium oxide  400 mg Oral Daily  . metFORMIN  500 mg Oral BID  . omega-3 acid ethyl esters  2 g Oral Daily  . propranolol  20 mg Oral BID  . DISCONTD: fish oil-omega-3 fatty acids  2 g Oral Daily  . DISCONTD: Magnesium  2 tablet Oral Daily  . DISCONTD: Vitamin B-12  1 tablet Sublingual Daily  . DISCONTD: Vitamin B-12  1 tablet Sublingual Daily   Continuous Infusions:   . sodium chloride 50 mL/hr at 03/02/11 0042  . sodium chloride 75 mL/hr at 03/02/11 0043  . sodium chloride    . DISCONTD: sodium chloride 125 mL/hr at 03/01/11 1739   PRN Meds:.acetaminophen, HYDROcodone-acetaminophen, loperamide, ondansetron (ZOFRAN) IV, ondansetron, promethazine  Assessment/Plan:   76 YO with  Hx of DM, bradycardia, CVA, recurrent episodes of dizziness and syncope who has had extensive work up and now scheduled for Pacemaker insertion next Thursday by [facility]. Patient presented  with another episode of NV and Dizziness.   PLAN:  DIZZINESS  Patient informs of similar recurrent symptoms in past and has had extensive work up at [facility]. She was admitted here in July for N/V with transaminitis with normal EUS. She informs being evaluated at [facility] for sphincter of oddi dysfn Also has  tried ? Vestibular rehab for dizziness without much benefit  PT/ OT eval Currently improved Nurse informed that [facility] will see her as outpatient only and has schedule for pacemaker next week IV fluids  Hyponatremia  likely from dehydration  cont IV fluids  monitor in am D/c hctz  ( it was discontinued on last discharge), hold lisinopril  Diabetes mellitus 2 Cont home  meds  Bradycardia  stable on tele  DVT prophylaxis   LOS: 1 day   Avalene Sealy 03/02/2011, 6:01 PM

## 2011-03-03 DIAGNOSIS — E871 Hypo-osmolality and hyponatremia: Secondary | ICD-10-CM | POA: Diagnosis present

## 2011-03-03 DIAGNOSIS — E860 Dehydration: Secondary | ICD-10-CM | POA: Diagnosis present

## 2011-03-03 LAB — BASIC METABOLIC PANEL
BUN: 14 mg/dL (ref 6–23)
Chloride: 96 mEq/L (ref 96–112)
Glucose, Bld: 90 mg/dL (ref 70–99)
Potassium: 4 mEq/L (ref 3.5–5.1)

## 2011-03-03 LAB — GLUCOSE, CAPILLARY: Glucose-Capillary: 170 mg/dL — ABNORMAL HIGH (ref 70–99)

## 2011-03-03 NOTE — Discharge Summary (Signed)
Patient ID: [name] MRN: [id number] DOB/AGE: Apr 24, 1934 76 y.o.  Admit date: 03/01/2011 Discharge date: 03/03/2011  Primary Care Physician:  [name], MD  Discharge Diagnoses:   Dehydration with hyponatremia Nausea and vomiting Dizziness Bradycardia Diabetes mellitus Hx of CVA   Current Discharge Medication List    CONTINUE these medications which have NOT CHANGED   Details  acetaminophen (TYLENOL) 500 MG tablet Take 1,000 mg by mouth once as needed. For pain     CALCIUM PO Take 630 mg by mouth daily.      Cholecalciferol (VITAMIN D) 2000 UNITS tablet Take 2,000 Units by mouth daily.      citalopram (CELEXA) 20 MG tablet Take 10 mg by mouth daily.      Cyanocobalamin (VITAMIN B-12) 2500 MCG SUBL Place 1 tablet under the tongue daily.      fish oil-omega-3 fatty acids 1000 MG capsule Take 2 g by mouth daily.      glipiZIDE (GLUCOTROL XL) 10 MG 24 hr tablet Take 10 mg by mouth 2 (two) times daily.      insulin glargine (LANTUS SOLOSTAR) 100 UNIT/ML injection Inject 5 Units into the skin daily as needed. For blood sugar under 120     letrozole (FEMARA) 2.5 MG tablet Take 2.5 mg by mouth daily.      lisinopril (PRINIVIL,ZESTRIL) 10 MG tablet Take 10 mg by mouth every evening.      loperamide (IMODIUM A-D) 2 MG tablet Take 4 mg by mouth once as needed. For diarrhea     Magnesium 250 MG TABS Take 2 tablets by mouth daily.      metFORMIN (GLUCOPHAGE) 500 MG tablet Take 500 mg by mouth 2 (two) times daily.     promethazine (PHENERGAN) 25 MG tablet Take 25 mg by mouth every 6 (six) hours as needed. For nausea      propranolol (INDERAL) 40 MG tablet Take 20 mg by mouth 2 (two) times daily.       STOP taking these medications     hydrochlorothiazide (HYDRODIURIL) 25 MG tablet         Disposition and Follow-up:  Home with follow up with PCP in 1 week  has scheduled PPM placement on 12/27  Consults:  none  Significant Diagnostic Studies:  Dg Chest  2 View  03/01/2011  *RADIOLOGY REPORT*  Clinical Data: Dizziness and weakness.  CHEST - 2 VIEW  Comparison: Most recent 07/22/2010.  Also 03/18/2009.  Findings: Cardiomegaly.  Tortuous aorta.  No infiltrates or failure. No acute osseous findings.  Chronic lower thoracic compression fracture, likely T12, with associated kyphosis, appears stable from 2011. There is been a right mastectomy and axillary node dissection.  There is no visible metastatic disease.  IMPRESSION: Cardiomegaly, no definite active process.  Per CMS PQRS reporting requirements (PQRS Measure 24): Given the patient's age of greater than 50 and the fracture site (hip, distal radius, or spine), the patient should be tested for osteoporosis using DXA, and the appropriate treatment considered based on the DXA results.  Original Report Authenticated By: [name], M.D.   Ct Head Wo Contrast  03/01/2011  *RADIOLOGY REPORT*  Clinical Data: Dizziness.  CT HEAD WITHOUT CONTRAST  Technique:  Contiguous axial images were obtained from the base of the skull through the vertex without contrast.  Comparison: 07/21/2010  Findings: There is a stent seen within the right middle cerebral artery (within the M1 segment). There is atrophy and chronic small vessel disease changes. No acute intracranial abnormality.  Specifically, no hemorrhage, hydrocephalus, mass lesion, acute infarction, or significant intracranial injury.  No acute calvarial abnormality. Visualized paranasal sinuses and mastoids clear. Orbital soft tissues unremarkable.  IMPRESSION: No acute intracranial abnormality.  Atrophy, chronic microvascular disease.  Original Report Authenticated By: Cyndie Chime, M.D.    Brief H and P: For complete details please refer to admission H and P, but in brief 76 YO with recurrent episodes of dizziness and syncope who has had extensive work up and now scheduled for Pacemaker insertion next Thursday by [facility]. Patient went to [facility] with another episode  of NV and Dizziness. Unable to keep things down at the time. No abdominal pain, no diarrhea.    Physical Exam on Discharge:  Filed Vitals:   03/02/11 1724 03/02/11 2154 03/02/11 2200 03/03/11 1019  BP: 158/75  158/82 125/77  Pulse:  59 55 57  Temp:   97.5 F (36.4 C)   TempSrc:      Resp:      Height:      Weight:   101.5 kg (223 lb 12.3 oz)   SpO2:   94%      Intake/Output Summary (Last 24 hours) at 03/03/11 1522 Last data filed at 03/03/11 1100  Gross per 24 hour  Intake    360 ml  Output   3600 ml  Net  -3240 ml    General:elderly female in no acute distress.  HEENT: no pallor, no icterus, moist oral mucosa, no JVD, no lymphadenopathy  Heart: Normal s1 &s2 Regular rate and rhythm, without murmurs, rubs, gallops.  Lungs: Clear to auscultation bilaterally.  Abdomen: Soft, nontender, nondistended, positive bowel sounds.  Extremities: No clubbing cyanosis or edema with positive pedal pulses.  Neuro: Alert, awake, oriented x3, nonfocal.  CBC:    Component Value Date/Time   WBC 4.1 03/02/2011 0710   WBC 5.3 04/10/2010 1332   HGB 11.8* 03/02/2011 0710   HGB 13.0 04/10/2010 1332   HCT 33.4* 03/02/2011 0710   HCT 37.0 04/10/2010 1332   PLT 192 03/02/2011 0710   PLT 186 04/10/2010 1332   MCV 88.8 03/02/2011 0710   MCV 92.3 04/10/2010 1332   NEUTROABS 3.8 03/01/2011 1725   NEUTROABS 3.6 04/10/2010 1332   LYMPHSABS 1.7 03/01/2011 1725   LYMPHSABS 1.2 04/10/2010 1332   MONOABS 0.6 03/01/2011 1725   MONOABS 0.3 04/10/2010 1332   EOSABS 0.1 03/01/2011 1725   EOSABS 0.2 04/10/2010 1332   BASOSABS 0.0 03/01/2011 1725   BASOSABS 0.0 04/10/2010 1332    Basic Metabolic Panel:    Component Value Date/Time   NA 132* 03/03/2011 0500   K 4.0 03/03/2011 0500   CL 96 03/03/2011 0500   CO2 28 03/03/2011 0500   BUN 14 03/03/2011 0500   CREATININE 0.94 03/03/2011 0500   GLUCOSE 90 03/03/2011 0500   CALCIUM 9.4 03/03/2011 0500    Hospital Course:   Nausea and vomiting with  dehydration  patient given IV fluids and Reglan with  Improvement in symptoms  noted for hyponatremia which is now corrected with fluids  Dizziness Patient informs of similar recurrent symptoms in past and has had extensive work up at [facility]. She was admitted here in July for N/V with transaminitis and with normal EUS. She informs being evaluated at [facility] for sphincter of oddi dysfn  Also has tried ? Vestibular rehab for dizziness without much benefit . She informs that dizziness has been present for several years. She follows with Dr Pearlean Brownie for her stroke in 2011.  PT/ OT eval done and is safe to go home Currently improved   Hyponatremia  Na of 127 on admission, now improved to  132 likely from dehydration and patient being on HCTZ. She was advised to discontinue it on last discharge as she also had hyponatremia then.  Improved with IV fluids  will discontinue HCTZ.   Bradycardia  stable on tele .she has appt on 12/27 for pacemaker placement with [facility].  PATIENT STABLE TO BE DISCHARGED HOME.  Time spent on Discharge: 30 minutes  Signed: Eddie North 03/03/2011, 3:22 PM

## 2011-03-03 NOTE — Progress Notes (Signed)
Physical Therapy Evaluation Patient Details Name: [name] MRN: [id number] DOB: February 19, 1935 Today's Date: 03/03/2011  Problem List:  Patient Active Problem List  Diagnoses  . Wears glasses  . Wears dentures  . Vomiting  . Dizziness  . Bradycardia  . DM (diabetes mellitus)    Past Medical History:  Past Medical History  Diagnosis Date  . Diabetes mellitus     Type 2  . Stroke     TIA per patient history form dated 02/15/10.  . Goiter   . Arthritis   . Dizziness - light-headed   . Family history of breast cancer     Did not specify what member of family per history form dated 02/15/10.  . Menopause   . Cancer     rt. breast ca  . Slow heart rate    Past Surgical History:  Past Surgical History  Procedure Date  . Joint replacement     Knee - ask patient to clarify which knee or both.  . Breast surgery     lumpectomy  . Brain surgery     Stent placement  . Gallbladder surgery   . Mastectomy   . Thyroid surgery     due to Goiter  . Cholecystectomy     PT Assessment/Plan/Recommendation PT Assessment Clinical Impression Statement: Pt is a 76 y/o female admitted for syncope work-up secondary to bradycardia.  Pt for pacemaker next week.  Currently, pt modified independent for all mobiliy and is d/c'ing to daughter's home.  No further PT needs acutely or at d/c at this time. PT Recommendation/Assessment: Patent does not need any further PT services No Skilled PT: Patient is modified independent with all activity/mobility;All education completed;Patient will have necessary level of assist by caregiver at discharge PT Recommendation Follow Up Recommendations: None Equipment Recommended: None recommended by PT  PT Evaluation Precautions/Restrictions  Precautions Precautions: Fall Required Braces or Orthoses: No Restrictions Weight Bearing Restrictions: No Prior Functioning  Home Living Lives With: Alone Receives Help From: Family (D/C'ing to daughter's  house.) Type of Home: House Home Layout: One level Home Access: Ramped entrance Home Adaptive Equipment: Walker - rolling;Shower chair with back;Raised toilet seat with rails Prior Function Level of Independence: Independent with basic ADLs;Independent with homemaking with ambulation;Independent with transfers;Requires assistive device for independence Able to Take Stairs?: No Driving: No Vocation: Retired Producer, television/film/video: Awake/alert Overall Cognitive Status: Appears within functional limits for tasks assessed Sensation/Coordination Sensation Light Touch: Appears Intact Stereognosis: Not tested Hot/Cold: Not tested Proprioception: Not tested Coordination Gross Motor Movements are Fluid and Coordinated: Yes Fine Motor Movements are Fluid and Coordinated: Not tested Extremity Assessment RUE Assessment RUE Assessment: Within Functional Limits LUE Assessment LUE Assessment: Within Functional Limits RLE Assessment RLE Assessment: Within Functional Limits LLE Assessment LLE Assessment: Within Functional Limits Pain 0/10 with evaluation. Mobility (including Balance) Bed Mobility Bed Mobility: Yes Supine to Sit: 7: Independent Sit to Supine - Left: 7: Independent Transfers Transfers: Yes Sit to Stand: 6: Modified independent (Device/Increase time);From bed;From toilet (2 trials.) Stand to Sit: 6: Modified independent (Device/Increase time);To toilet;To bed (2 trials.) Ambulation/Gait Ambulation/Gait: Yes Ambulation/Gait Assistance: 6: Modified independent (Device/Increase time) Ambulation Distance (Feet): 350 Feet Assistive device: Rolling walker Gait Pattern: Within Functional Limits Stairs: No Wheelchair Mobility Wheelchair Mobility: No  Posture/Postural Control Posture/Postural Control: No significant limitations Balance Balance Assessed: No End of Session PT - End of Session Equipment Utilized During Treatment: Gait belt Activity  Tolerance: Patient tolerated treatment well Patient left: in bed;with call bell  in reach Nurse Communication: Mobility status for transfers;Mobility status for ambulation General Behavior During Session: [facility] for tasks performed Cognition: [facility] for tasks performed  Cephus Shelling 03/03/2011, 1:15 PM  03/03/2011 Cephus Shelling, PT, DPT [phone]

## 2011-03-05 NOTE — Progress Notes (Signed)
   CARE MANAGEMENT NOTE 03/05/2011  Patient:  [name], [name]   Account Number:  [id number]  Date Initiated:  03/05/2011  Documentation initiated by:  Onnie Boer  Subjective/Objective Assessment:   PT WAS ADMITTED WITH SIRS 2ND TO UTI     Action/Plan:   PROGRESSION OF CARE AND DISCHARGE PLANNING   Anticipated DC Date:  03/03/2011   Anticipated DC Plan:  HOME/SELF CARE      DC Planning Services  CM consult      Choice offered to / List presented to:             Status of service:  Completed, signed off Medicare Important Message given?   (If response is "NO", the following Medicare IM given date fields will be blank) Date Medicare IM given:   Date Additional Medicare IM given:    Discharge Disposition:  HOME/SELF CARE  Per UR Regulation:  Reviewed for med. necessity/level of care/duration of stay  Comments:  03/05/2011 Onnie Boer, RN, BSN 1548 PT WAS DC'D TO HOME WITH SELF CARE

## 2011-03-07 ENCOUNTER — Ambulatory Visit
Admission: RE | Admit: 2011-03-07 | Discharge: 2011-03-07 | Disposition: A | Discharge status: Home / Self Care | Payer: Medicare Other | Source: Ambulatory Visit | Attending: Oncology | Admitting: Oncology

## 2011-03-07 ENCOUNTER — Other Ambulatory Visit: Payer: Self-pay | Admitting: Cardiovascular Disease

## 2011-03-07 DIAGNOSIS — Z9011 Acquired absence of right breast and nipple: Secondary | ICD-10-CM

## 2011-03-08 ENCOUNTER — Encounter (HOSPITAL_COMMUNITY): Admission: RE | Payer: Self-pay | Source: Ambulatory Visit

## 2011-03-08 ENCOUNTER — Ambulatory Visit (HOSPITAL_COMMUNITY): Admission: RE | Admit: 2011-03-08 | Payer: Medicare Other | Source: Ambulatory Visit | Admitting: Cardiovascular Disease

## 2011-03-08 SURGERY — PERMANENT PACEMAKER INSERTION
Anesthesia: LOCAL

## 2011-03-09 ENCOUNTER — Ambulatory Visit: Payer: PRIVATE HEALTH INSURANCE

## 2011-03-23 ENCOUNTER — Ambulatory Visit (INDEPENDENT_AMBULATORY_CARE_PROVIDER_SITE_OTHER): Payer: Medicare Other | Admitting: Surgery

## 2011-03-23 ENCOUNTER — Encounter (INDEPENDENT_AMBULATORY_CARE_PROVIDER_SITE_OTHER): Payer: Self-pay | Admitting: Surgery

## 2011-03-23 VITALS — BP 142/90 | HR 68 | Temp 97.5°F | Resp 16 | Ht 66.0 in | Wt 221.2 lb

## 2011-03-23 DIAGNOSIS — Z853 Personal history of malignant neoplasm of breast: Secondary | ICD-10-CM

## 2011-03-23 HISTORY — DX: Personal history of malignant neoplasm of breast: Z85.3

## 2011-03-23 NOTE — Progress Notes (Signed)
[facility]  Ovidio Kin, MD,  FACS [address] [place]  [address] [place], [place]    [address] Phone:  [phone] FAX:  [phone]   Re:   GREENLEIGH KAUTH DOB:   14-Dec-1934 MRN:   [id number]  ASSESSMENT AND PLAN: 1.  Right breast cancer, T3, N1 (7/20 nodes).  Right mastectomy - 02/27/2007.  Femara.  Disease free.  To see me back in 6 months.  2.  S/P stroke with some balance issues.  TIA - 02/15/2010.  Stent placed by Dr. Corliss Skains. 3.  Recent hospitalization for dizziness - Dec 2012.  She's to get a pacemaker by Dr. Royann Shivers next week. 4.  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  Dr. Chip Boer and [facility].  Better, but not resolved. 5.  Elevated LFT's.  Unknown reason.  HISTORY OF PRESENT ILLNESS: Chief Complaint  Patient presents with  . Breast Cancer Long Term Follow Up    final breast reck    Shameka Aggarwal Bunnell is a 77 y.o. (DOB: 1934/09/12)  white female who is a patient of BURNETT,BRENT A, MD, MD and comes to me today for folow up of right breast cancer.  She is in good spirits.  She recounted how she has been in the hospital multiple times this past year, for nausea and dizziness.  She went to [facility] for "spincter of Otti" syndrome.  She is going to get a pacemaker next week.  But she as noticed no new problems with her mastectomy incision or left breast.  PHYSICAL EXAM: BP 142/90  Pulse 68  Temp(Src) 97.5 F (36.4 C) (Temporal)  Resp 16  Ht 5\' 6"  (1.676 m)  Wt 221 lb 3.2 oz (100.336 kg)  BMI 35.70 kg/m2  HEENT:  Pupils equal.  Dentition good.  No injury. NECK:  Supple.  No thyroid mass. LYMPH NODES:  No cervical, supraclavicular, or axillary adenopathy. BREASTS -  RIGHT:  Absent, some redundant skin.  She as some neovascularization around mastectomy scar.  No palpable mass or nodule.    LEFT:  No palpable mass or nodule.  No nipple discharge. UPPER EXTREMITIES:  No evidence of lymphedema.  DATA  REVIEWED: Mammograms - 03/07/2011.   Ovidio Kin, MD, FACS Office:  [phone]

## 2011-03-26 MED ORDER — SODIUM CHLORIDE 0.9 % IV SOLN
INTRAVENOUS | Status: DC
  Administered 2011-03-27: 10:00:00 via INTRAVENOUS

## 2011-03-26 MED ORDER — CEFAZOLIN SODIUM-DEXTROSE 2-3 GM-% IV SOLR
2.0000 g | INTRAVENOUS | Status: DC
  Filled 2011-03-26: qty 50

## 2011-03-26 MED ORDER — SODIUM CHLORIDE 0.45 % IV SOLN: INTRAVENOUS | Status: DC

## 2011-03-26 MED ORDER — CHLORHEXIDINE GLUCONATE 4 % EX LIQD
60.0000 mL | Freq: Once | CUTANEOUS | Status: DC
  Filled 2011-03-26: qty 60

## 2011-03-26 MED ORDER — SODIUM CHLORIDE 0.9 % IR SOLN
80.0000 mg | Status: DC
  Filled 2011-03-26: qty 2

## 2011-03-27 ENCOUNTER — Encounter (HOSPITAL_COMMUNITY)
Admission: RE | Disposition: A | Discharge status: Home / Self Care | Payer: Self-pay | Source: Ambulatory Visit | Attending: Cardiovascular Disease

## 2011-03-27 ENCOUNTER — Encounter (HOSPITAL_COMMUNITY): Payer: Self-pay

## 2011-03-27 ENCOUNTER — Ambulatory Visit (HOSPITAL_COMMUNITY)
Admission: RE | Admit: 2011-03-27 | Discharge: 2011-03-28 | Disposition: A | Discharge status: Home / Self Care | Payer: Medicare Other | Source: Ambulatory Visit | Attending: Cardiovascular Disease | Admitting: Cardiovascular Disease

## 2011-03-27 DIAGNOSIS — I447 Left bundle-branch block, unspecified: Secondary | ICD-10-CM | POA: Diagnosis present

## 2011-03-27 DIAGNOSIS — I440 Atrioventricular block, first degree: Secondary | ICD-10-CM | POA: Diagnosis present

## 2011-03-27 DIAGNOSIS — E119 Type 2 diabetes mellitus without complications: Secondary | ICD-10-CM | POA: Insufficient documentation

## 2011-03-27 DIAGNOSIS — Z96659 Presence of unspecified artificial knee joint: Secondary | ICD-10-CM | POA: Insufficient documentation

## 2011-03-27 DIAGNOSIS — Z8673 Personal history of transient ischemic attack (TIA), and cerebral infarction without residual deficits: Secondary | ICD-10-CM | POA: Insufficient documentation

## 2011-03-27 DIAGNOSIS — R5383 Other fatigue: Secondary | ICD-10-CM | POA: Diagnosis present

## 2011-03-27 DIAGNOSIS — I495 Sick sinus syndrome: Secondary | ICD-10-CM

## 2011-03-27 DIAGNOSIS — R001 Bradycardia, unspecified: Secondary | ICD-10-CM | POA: Diagnosis present

## 2011-03-27 DIAGNOSIS — Z950 Presence of cardiac pacemaker: Secondary | ICD-10-CM

## 2011-03-27 DIAGNOSIS — I441 Atrioventricular block, second degree: Secondary | ICD-10-CM | POA: Insufficient documentation

## 2011-03-27 DIAGNOSIS — I10 Essential (primary) hypertension: Secondary | ICD-10-CM | POA: Insufficient documentation

## 2011-03-27 DIAGNOSIS — E785 Hyperlipidemia, unspecified: Secondary | ICD-10-CM | POA: Insufficient documentation

## 2011-03-27 DIAGNOSIS — Z853 Personal history of malignant neoplasm of breast: Secondary | ICD-10-CM | POA: Insufficient documentation

## 2011-03-27 HISTORY — DX: Other specified postprocedural states: Z98.890

## 2011-03-27 HISTORY — DX: Presence of cardiac pacemaker: Z95.0

## 2011-03-27 HISTORY — PX: PERMANENT PACEMAKER INSERTION: SHX5480

## 2011-03-27 HISTORY — PX: PERMANENT PACEMAKER INSERTION: SHX6023

## 2011-03-27 HISTORY — DX: Other specified postprocedural states: R11.2

## 2011-03-27 HISTORY — DX: Sick sinus syndrome: I49.5

## 2011-03-27 LAB — CBC
HCT: 37.2 % (ref 36.0–46.0)
Hemoglobin: 12.6 g/dL (ref 12.0–15.0)
MCHC: 33.9 g/dL (ref 30.0–36.0)
MCV: 91.6 fL (ref 78.0–100.0)
MCV: 92.1 fL (ref 78.0–100.0)
Platelets: 187 10*3/uL (ref 150–400)
RBC: 3.69 MIL/uL — ABNORMAL LOW (ref 3.87–5.11)
RDW: 12.5 % (ref 11.5–15.5)
WBC: 6 10*3/uL (ref 4.0–10.5)
WBC: 4.8 10*3/uL (ref 4.0–10.5)

## 2011-03-27 LAB — BASIC METABOLIC PANEL
BUN: 17 mg/dL (ref 6–23)
Chloride: 98 mEq/L (ref 96–112)
Creatinine, Ser: 1.01 mg/dL (ref 0.50–1.10)
Glucose, Bld: 140 mg/dL — ABNORMAL HIGH (ref 70–99)
Potassium: 4.7 mEq/L (ref 3.5–5.1)

## 2011-03-27 LAB — SURGICAL PCR SCREEN: Staphylococcus aureus: NEGATIVE

## 2011-03-27 LAB — CREATININE, SERUM
Creatinine, Ser: 0.81 mg/dL (ref 0.50–1.10)
GFR calc Af Amer: 81 mL/min — ABNORMAL LOW (ref >90)
GFR calc non Af Amer: 70 mL/min — ABNORMAL LOW (ref >90)

## 2011-03-27 LAB — APTT: aPTT: 35 seconds (ref 24–37)

## 2011-03-27 LAB — GLUCOSE, CAPILLARY
Glucose-Capillary: 149 mg/dL — ABNORMAL HIGH (ref 70–99)
Glucose-Capillary: 144 mg/dL — ABNORMAL HIGH (ref 70–99)
Glucose-Capillary: 164 mg/dL — ABNORMAL HIGH (ref 70–99)

## 2011-03-27 SURGERY — PERMANENT PACEMAKER INSERTION
Anesthesia: LOCAL

## 2011-03-27 MED ORDER — ONDANSETRON HCL 4 MG/2ML IJ SOLN: 4.0000 mg | Freq: Four times a day (QID) | INTRAMUSCULAR | Status: DC | PRN

## 2011-03-27 MED ORDER — HYDROCHLOROTHIAZIDE 12.5 MG PO CAPS
12.5000 mg | ORAL_CAPSULE | Freq: Every day | ORAL | Status: DC
  Administered 2011-03-27 – 2011-03-28 (×2): 12.5 mg via ORAL
  Filled 2011-03-27 (×2): qty 1

## 2011-03-27 MED ORDER — MIDAZOLAM HCL 2 MG/2ML IJ SOLN
INTRAMUSCULAR | Status: AC
  Filled 2011-03-27: qty 2

## 2011-03-27 MED ORDER — SODIUM CHLORIDE 0.9 % IJ SOLN
3.0000 mL | Freq: Two times a day (BID) | INTRAMUSCULAR | Status: DC
  Administered 2011-03-27 – 2011-03-28 (×2): 3 mL via INTRAVENOUS

## 2011-03-27 MED ORDER — HEPARIN SODIUM (PORCINE) 5000 UNIT/ML IJ SOLN
5000.0000 [IU] | Freq: Three times a day (TID) | INTRAMUSCULAR | Status: DC
  Administered 2011-03-27 – 2011-03-28 (×2): 5000 [IU] via SUBCUTANEOUS
  Filled 2011-03-27 (×5): qty 1

## 2011-03-27 MED ORDER — SODIUM CHLORIDE 0.9 % IJ SOLN: 3.0000 mL | INTRAMUSCULAR | Status: DC | PRN

## 2011-03-27 MED ORDER — ACETAMINOPHEN 500 MG PO TABS
1000.0000 mg | ORAL_TABLET | Freq: Four times a day (QID) | ORAL | Status: DC
  Administered 2011-03-27 – 2011-03-28 (×2): 1000 mg via ORAL
  Filled 2011-03-27 (×2): qty 2

## 2011-03-27 MED ORDER — SODIUM CHLORIDE 0.9 % IV SOLN: 250.0000 mL | INTRAVENOUS | Status: DC | PRN

## 2011-03-27 MED ORDER — FENTANYL CITRATE 0.05 MG/ML IJ SOLN
INTRAMUSCULAR | Status: AC
  Filled 2011-03-27: qty 2

## 2011-03-27 MED ORDER — CITALOPRAM HYDROBROMIDE 10 MG PO TABS
10.0000 mg | ORAL_TABLET | Freq: Every day | ORAL | Status: DC
  Administered 2011-03-27 – 2011-03-28 (×2): 10 mg via ORAL
  Filled 2011-03-27 (×2): qty 1

## 2011-03-27 MED ORDER — ACETAMINOPHEN 325 MG PO TABS: 325.0000 mg | ORAL_TABLET | ORAL | Status: DC | PRN

## 2011-03-27 MED ORDER — MUPIROCIN 2 % EX OINT
TOPICAL_OINTMENT | CUTANEOUS | Status: AC
  Administered 2011-03-27: 1
  Filled 2011-03-27: qty 22

## 2011-03-27 MED ORDER — METFORMIN HCL 500 MG PO TABS
500.0000 mg | ORAL_TABLET | Freq: Two times a day (BID) | ORAL | Status: DC
  Administered 2011-03-27 – 2011-03-28 (×2): 500 mg via ORAL
  Filled 2011-03-27 (×4): qty 1

## 2011-03-27 MED ORDER — GLIPIZIDE ER 10 MG PO TB24
10.0000 mg | ORAL_TABLET | Freq: Two times a day (BID) | ORAL | Status: DC
  Administered 2011-03-27 – 2011-03-28 (×2): 10 mg via ORAL
  Filled 2011-03-27 (×4): qty 1

## 2011-03-27 MED ORDER — CEFAZOLIN SODIUM 1-5 GM-% IV SOLN
1.0000 g | Freq: Four times a day (QID) | INTRAVENOUS | Status: AC
  Administered 2011-03-27 – 2011-03-28 (×3): 1 g via INTRAVENOUS
  Filled 2011-03-27 (×3): qty 50

## 2011-03-27 MED ORDER — HEPARIN (PORCINE) IN NACL 2-0.9 UNIT/ML-% IJ SOLN
INTRAMUSCULAR | Status: AC
  Filled 2011-03-27: qty 1000

## 2011-03-27 MED ORDER — CEFAZOLIN SODIUM-DEXTROSE 2-3 GM-% IV SOLR
INTRAVENOUS | Status: AC
  Filled 2011-03-27: qty 50

## 2011-03-27 MED ORDER — LETROZOLE 2.5 MG PO TABS
2.5000 mg | ORAL_TABLET | Freq: Every day | ORAL | Status: DC
  Administered 2011-03-27 – 2011-03-28 (×2): 2.5 mg via ORAL
  Filled 2011-03-27 (×3): qty 1

## 2011-03-27 MED ORDER — HYDROCHLOROTHIAZIDE 25 MG PO TABS
12.5000 mg | ORAL_TABLET | Freq: Every day | ORAL | Status: DC
  Filled 2011-03-27: qty 0.5

## 2011-03-27 MED ORDER — LIDOCAINE HCL (PF) 1 % IJ SOLN
INTRAMUSCULAR | Status: AC
  Filled 2011-03-27: qty 60

## 2011-03-27 MED ORDER — LISINOPRIL 10 MG PO TABS
10.0000 mg | ORAL_TABLET | Freq: Every evening | ORAL | Status: DC
  Administered 2011-03-27: 10 mg via ORAL
  Filled 2011-03-27 (×2): qty 1

## 2011-03-27 MED ORDER — PROPRANOLOL HCL 20 MG PO TABS
20.0000 mg | ORAL_TABLET | Freq: Two times a day (BID) | ORAL | Status: DC
Start: 2011-03-27 — End: 2011-03-28
  Administered 2011-03-27 – 2011-03-28 (×2): 20 mg via ORAL
  Filled 2011-03-27 (×4): qty 1

## 2011-03-27 NOTE — Op Note (Signed)
[name],[name] Female, 77 y.o., 05/07/1934  MRN: [id number]  Procedure report  Procedure performed:  1. Implantation of new dual chamber permanent pacemaker  2. Fluoroscopy  3.  Light sedation    Reason for procedure:  Symptomatic bradycardia due to: Sinus node dysfunction Bradycardia due to necessary medications Second degree atrioventricular block Mobitz type II  Procedure performed by: Sheila Fair, MD  Complications: None  Estimated blood loss: <10 mL  Medications administered during procedure:  Ancef 1 g intravenously Lidocaine 1% 30 mL locally,  Fentanyl 50 mcg intravenously Versed 2 mg intravenously  Device details:  Facilities manager. [employer] accents DR RF model [id number] serial number [id number] Right atrial lead [employer] Tendril STS model [id number] TC-52 cm serial number [id number] Right ventricular lead [employer] tendril STS model 2088 TC-58 cm serial number [id number]  Procedure details:  After the risks and benefits of the procedure were discussed the patient provided informed consent and was brought to the cardiac cath lab in the fasting state. The patient was prepped and draped in usual sterile fashion. Local anesthesia with 1% lidocaine was administered to to the left infraclavicular area. A 5-6 cm horizontal incision was made parallel with and 2-3 cm caudal to the left clavicle. Using electrocautery and blunt dissection a prepectoral pocket was created down to the level of the pectoralis major muscle fascia. The pocket was carefully inspected for hemostasis. An antibiotic-soaked sponge was placed in the pocket.  Under fluoroscopic guidance and using the modified Seldinger technique 2 separate venipunctures were performed to access the left subclavian vein. no difficulty was encountered accessing the vein.  Two J-tip guidewires were subsequently exchanged for 2 7 French safe sheaths.  Under fluoroscopic guidance the ventricular lead was advanced  to level of the mid to apical right ventricular septum and thet active-fixation helix was deployed. Prominent current of injury was seen. Satisfactory pacing and sensing parameters were recorded. There was no evidence of diaphragmatic stimulation at maximum device output. The safe sheath was peeled away and the lead was secured in place with 2-0 silk.  In similar fashion the right atrial lead was advanced to the level of the atrial appendage. The active-fixation helix was deployed. There was prominent current of injury. Satisfactory  pacing and sensing parameters were recorded. There was no evidence of diaphragmatic stimulation with pacing at maximum device output. The safe sheath was peeled away and the lead was secured in place with 2-0 silk.  The antibiotic-soaked sponge was removed from the pocket. The pocket was flushed with copious amounts of antibiotic solution. Reinspection showed excellent hemostasis..  The ventricular lead was connected to the generator and appropriate ventricular pacing was seen. Subsequently the atrial lead was also connected. Repeat testing of the lead parameters later showed excellent values.  The entire system was then carefully inserted in the pocket with care been taking that the leads and device assumed a comfortable position without pressure on the incision. Great care was taken that the leads be located deep to the generator. A single silk suture was used to attach the generator to the pectoralis fascia. The pocket was then closed in layers using 2 layers of 2-0 Vicryl and cutaneous staples, after which a sterile dressing was applied.  At the end of the procedure the following lead parameters were encountered via telemetry:  Right atrial lead  sensed P waves 2.2-3.3 mV, impedance 856 ohms, threshold 2.2 V at 0.49ms pulse width. Pacing threshold was rapidly improving and  There was  still very prominent current of injury  Right ventricular lead sensed R waves 7.9-9.3  mV, impedance 1101 ohms, threshold 1.1 V at 0.4 ms pulse width.   Sheila Fair, MD, [facility] [phone] 03/27/2011 1:24 PM

## 2011-03-27 NOTE — Progress Notes (Signed)
VSS throughout monitoring time after procedure. Scant amount of drainage to left chest dressing. Pt. Tolerating diet well. Will cont. To monitor. [name], Chrystine Oiler

## 2011-03-27 NOTE — H&P (Signed)
[name] is an 77 y.o. female.   Chief Complaint:  Pacemaker placement HPI:  Patient is a 77 year old obese Caucasian female with persistent sinus bradycardia, left bundle branch block and first degree AV block was presenting for a pacemaker implantation. She complains of fatigue and dizziness. She denies chest pain short shortness of breath abdominal pain lower extremity edema nausea vomiting fever. She had been hospitalized last few months with sudden onset of nausea vomiting abdominal discomfort with elevated liver enzymes. There is no evidence of gallstones by ultrasound or CT of the abdomen.  Patient has contemplating implantation of pacemaker for several months and has finally decided to proceed.   Past Medical History  Diagnosis Date  . Diabetes mellitus     Type 2  . Stroke     TIA per patient history form dated 02/15/10.  . Goiter   . Arthritis   . Dizziness - light-headed   . Family history of breast cancer     Did not specify what member of family per history form dated 02/15/10.  . Menopause   . Cancer     rt. breast ca  . Slow heart rate   . Hyperlipidemia   . Hypertension   . PONV (postoperative nausea and vomiting)     Past Surgical History  Procedure Date  . Joint replacement     Knee - ask patient to clarify which knee or both.  . Breast surgery     lumpectomy  . Brain surgery     Stent placement  . Gallbladder surgery   . Mastectomy   . Thyroid surgery     due to Goiter  . Cholecystectomy   . Total knee arthroplasty 2008    left    Family History  Problem Relation Age of Onset  . COPD Mother   . Cancer Father     lung, colon  . Cancer Maternal Aunt     breast  . Cancer Maternal Grandmother     breast   Social History:  reports that she has never smoked. She does not have any smokeless tobacco history on file. She reports that she does not drink alcohol or use illicit drugs.  Allergies:  Allergies  Allergen Reactions  . Oxycontin Nausea  And Vomiting  . Crestor (Rosuvastatin Calcium) Other (See Comments)    dizziness  . Statins     Cramping in legs  . Zetia (Ezetimibe) Other (See Comments)    dizziness    Medications Prior to Admission  Medication Dose Route Frequency Provider Last Rate Last Dose  . 0.45 % sodium chloride infusion   Intravenous Continuous Kao Berkheimer, MD      . 0.9 %  sodium chloride infusion   Intravenous Continuous Chasmine Lender, MD      . ceFAZolin (ANCEF) IVPB 2 g/50 mL premix  2 g Intravenous On Call Obie Kallenbach, MD      . chlorhexidine (HIBICLENS) 4 % liquid 4 application  60 mL Topical Once Trey Bebee, MD      . gentamicin (GARAMYCIN) 80 mg in sodium chloride irrigation 0.9 % 500 mL irrigation  80 mg Irrigation On Call Charolett Yarrow, MD      . mupirocin ointment (BACTROBAN) 2 %            Medications Prior to Admission  Medication Sig Dispense Refill  . CALCIUM PO Take 630 mg by mouth daily.       . Cholecalciferol (VITAMIN D) 2000 UNITS tablet Take  2,000 Units by mouth daily.        . citalopram (CELEXA) 20 MG tablet Take 10 mg by mouth daily.        . Cyanocobalamin (VITAMIN B-12) 2500 MCG SUBL Place 1 tablet under the tongue daily.        . fish oil-omega-3 fatty acids 1000 MG capsule Take 2 g by mouth daily.        Marland Kitchen glipiZIDE (GLUCOTROL XL) 10 MG 24 hr tablet Take 10 mg by mouth 2 (two) times daily.        Marland Kitchen letrozole (FEMARA) 2.5 MG tablet Take 2.5 mg by mouth daily.        Marland Kitchen lisinopril (PRINIVIL,ZESTRIL) 10 MG tablet Take 10 mg by mouth every evening.        . Magnesium 250 MG TABS Take 2 tablets by mouth daily.        . metFORMIN (GLUCOPHAGE) 500 MG tablet Take 500 mg by mouth 2 (two) times daily.       . propranolol (INDERAL) 40 MG tablet Take 20 mg by mouth 2 (two) times daily.       . insulin glargine (LANTUS SOLOSTAR) 100 UNIT/ML injection Inject 5 Units into the skin daily as needed. For blood sugar over 120             Results for orders placed during the hospital  encounter of 03/27/11 (from the past 48 hour(s))  CBC     Status: Normal   Collection Time   03/27/11  9:33 AM      Component Value Range Comment   WBC 6.0  4.0 - 10.5 (K/uL)    RBC 4.06  3.87 - 5.11 (MIL/uL)    Hemoglobin 12.6  12.0 - 15.0 (g/dL)    HCT 40.9  81.1 - 91.4 (%)    MCV 91.6  78.0 - 100.0 (fL)    MCH 31.0  26.0 - 34.0 (pg)    MCHC 33.9  30.0 - 36.0 (g/dL)    RDW 78.2  95.6 - 21.3 (%)    Platelets 223  150 - 400 (K/uL)   GLUCOSE, CAPILLARY     Status: Abnormal   Collection Time   03/27/11  9:43 AM      Component Value Range Comment   Glucose-Capillary 149 (*) 70 - 99 (mg/dL)    Review of Systems  Constitutional: Negative for fever.  Respiratory: Negative for cough, shortness of breath and wheezing.   Cardiovascular: Negative for chest pain, orthopnea and leg swelling.  Gastrointestinal: Negative for nausea, vomiting, abdominal pain, diarrhea, constipation and blood in stool.  Genitourinary: Negative for dysuria and hematuria.  Neurological: Positive for dizziness.  All other systems reviewed and are negative.    Blood pressure 125/76, pulse 67, temperature 97.6 F (36.4 C), temperature source Oral, resp. rate 18, height 5\' 6"  (1.676 m), weight 99.791 kg (220 lb), SpO2 95.00%. Physical Exam  Constitutional: She is oriented to person, place, and time. She appears well-nourished. She is cooperative. No distress.  HENT:  Head: Normocephalic and atraumatic.  Eyes: EOM are normal. Pupils are equal, round, and reactive to light. No scleral icterus.  Neck: No JVD present.  Cardiovascular: Normal rate and regular rhythm.   Murmur heard.      16 systolic MM.  Respiratory: Effort normal and breath sounds normal. She has no wheezes. She has no rales.  GI: Soft. Bowel sounds are normal. She exhibits no distension. There is no tenderness.  Musculoskeletal: She exhibits  no edema.  Neurological: She is alert and oriented to person, place, and time. She exhibits normal muscle  tone.  Skin: Skin is warm and dry. No erythema.  Psychiatric: She has a normal mood and affect.     Assessment/Plan 1.  Bradycardia 2 left bundle branch block 3 first-degree AV block 4 dizziness 5 diabetes mellitus 6 history of hypertension  Plan:  Patient presents for implantation of pacemaker.  HAGER,BRYAN W 03/27/2011, 10:05 AM   I have seen and examined the patient along with Dwana Melena, PA.  I have reviewed the chart, notes and new data.  I agree with PA's note. PLAN: Risks and benefits of dual chamber permanent pacemaker implantation reviewed. She agrees to proceed.  Thurmon Fair, MD, [facility] [phone] 03/27/2011, 11:51 AM

## 2011-03-28 ENCOUNTER — Ambulatory Visit (HOSPITAL_COMMUNITY): Payer: Medicare Other

## 2011-03-28 ENCOUNTER — Other Ambulatory Visit: Payer: Self-pay

## 2011-03-28 DIAGNOSIS — R5383 Other fatigue: Secondary | ICD-10-CM | POA: Diagnosis present

## 2011-03-28 DIAGNOSIS — I440 Atrioventricular block, first degree: Secondary | ICD-10-CM | POA: Diagnosis present

## 2011-03-28 DIAGNOSIS — I447 Left bundle-branch block, unspecified: Secondary | ICD-10-CM | POA: Diagnosis present

## 2011-03-28 MED ORDER — ACETAMINOPHEN 325 MG PO TABS: 325.0000 mg | ORAL_TABLET | ORAL | Status: DC | PRN

## 2011-03-28 NOTE — Discharge Summary (Signed)
Patient ID: [name],  MRN: [id number], DOB/AGE: December 08, 1934 77 y.o.  Admit date: 03/27/2011 Discharge date: 03/28/2011  Primary Care Provider: Dr Rosezetta Schlatter Primary Cardiologist: Dr Royann Shivers  Discharge Diagnoses  Principal Problem:  *Fatigue, secondary to bradycardia  Active Problems:  Bradycardia, symptomatic  LBBB (left bundle branch block)  First degree AV block  DM (diabetes mellitus), IDDM  History of breast cancer, right, T3, N1, mastectomy 02/27/2007.    Procedures: St Jude pacemaker 03/27/11   Hospital Course: Patient is a 77 year old obese Caucasian female with persistent sinus bradycardia, left bundle branch block and first degree AV block was presenting for a pacemaker implantation. She complains of fatigue and dizziness. She denies chest pain short shortness of breath abdominal pain lower extremity edema nausea vomiting fever. Patient has contemplating implantation of pacemaker for several months and has finally decided  On 03/27/11 pt had St Jude pacemaker implanted without complications, see Dr Croitoru's OP note for details. CXR on 03/28/11 showed no ptx. She is stable for discharge and will be seen in one week for site check.   Discharge Vitals:  Blood pressure 134/75, pulse 60, temperature 97.7 F (36.5 C), temperature source Oral, resp. rate 19, height 5\' 6"  (1.676 m), weight 99.791 kg (220 lb), SpO2 93.00%.    Labs: Results for orders placed during the hospital encounter of 03/27/11 (from the past 48 hour(s))  CBC     Status: Normal   Collection Time   03/27/11  9:33 AM      Component Value Range Comment   WBC 6.0  4.0 - 10.5 (K/uL)    RBC 4.06  3.87 - 5.11 (MIL/uL)    Hemoglobin 12.6  12.0 - 15.0 (g/dL)    HCT 40.9  81.1 - 91.4 (%)    MCV 91.6  78.0 - 100.0 (fL)    MCH 31.0  26.0 - 34.0 (pg)    MCHC 33.9  30.0 - 36.0 (g/dL)    RDW 78.2  95.6 - 21.3 (%)    Platelets 223  150 - 400 (K/uL)   BASIC METABOLIC PANEL     Status: Abnormal   Collection Time   03/27/11  9:33 AM      Component Value Range Comment   Sodium 136  135 - 145 (mEq/L)    Potassium 4.7  3.5 - 5.1 (mEq/L)    Chloride 98  96 - 112 (mEq/L)    CO2 26  19 - 32 (mEq/L)    Glucose, Bld 140 (*) 70 - 99 (mg/dL)    BUN 17  6 - 23 (mg/dL)    Creatinine, Ser 0.86  0.50 - 1.10 (mg/dL)    Calcium 9.8  8.4 - 10.5 (mg/dL)    GFR calc non Af Amer 53 (*) >90 (mL/min)    GFR calc Af Amer 62 (*) >90 (mL/min)   PROTIME-INR     Status: Normal   Collection Time   03/27/11  9:33 AM      Component Value Range Comment   Prothrombin Time 13.0  11.6 - 15.2 (seconds)    INR 0.96  0.00 - 1.49    APTT     Status: Normal   Collection Time   03/27/11  9:33 AM      Component Value Range Comment   aPTT 35  24 - 37 (seconds)   SURGICAL PCR SCREEN     Status: Normal   Collection Time   03/27/11  9:34 AM      Component Value Range  Comment   MRSA, PCR NEGATIVE  NEGATIVE     Staphylococcus aureus NEGATIVE  NEGATIVE    GLUCOSE, CAPILLARY     Status: Abnormal   Collection Time   03/27/11  9:43 AM      Component Value Range Comment   Glucose-Capillary 149 (*) 70 - 99 (mg/dL)   CBC     Status: Abnormal   Collection Time   03/27/11  3:10 PM      Component Value Range Comment   WBC 4.8  4.0 - 10.5 (K/uL)    RBC 3.69 (*) 3.87 - 5.11 (MIL/uL)    Hemoglobin 11.6 (*) 12.0 - 15.0 (g/dL)    HCT 78.2 (*) 95.6 - 46.0 (%)    MCV 92.1  78.0 - 100.0 (fL)    MCH 31.4  26.0 - 34.0 (pg)    MCHC 34.1  30.0 - 36.0 (g/dL)    RDW 21.3  08.6 - 57.8 (%)    Platelets 187  150 - 400 (K/uL)   CREATININE, SERUM     Status: Abnormal   Collection Time   03/27/11  3:10 PM      Component Value Range Comment   Creatinine, Ser 0.81  0.50 - 1.10 (mg/dL)    GFR calc non Af Amer 70 (*) >90 (mL/min)    GFR calc Af Amer 81 (*) >90 (mL/min)   GLUCOSE, CAPILLARY     Status: Abnormal   Collection Time   03/27/11  4:50 PM      Component Value Range Comment   Glucose-Capillary 144 (*) 70 - 99 (mg/dL)    Comment 1 Notify RN       Comment 2 Documented in Chart     GLUCOSE, CAPILLARY     Status: Abnormal   Collection Time   03/27/11  6:09 PM      Component Value Range Comment   Glucose-Capillary 164 (*) 70 - 99 (mg/dL)   GLUCOSE, CAPILLARY     Status: Abnormal   Collection Time   03/27/11  9:45 PM      Component Value Range Comment   Glucose-Capillary 194 (*) 70 - 99 (mg/dL)   GLUCOSE, CAPILLARY     Status: Abnormal   Collection Time   03/28/11  5:57 AM      Component Value Range Comment   Glucose-Capillary 130 (*) 70 - 99 (mg/dL)     Disposition:  Follow-up Information    Follow up with Thurmon Fair, MD. (office will call)    Contact information:   [address] [place] [address] [phone]          Discharge Medications:  Current Discharge Medication List    START taking these medications   Details  acetaminophen (TYLENOL) 325 MG tablet Take 1-2 tablets (325-650 mg total) by mouth every 4 (four) hours as needed.      CONTINUE these medications which have NOT CHANGED   Details  CALCIUM PO Take 630 mg by mouth daily.     Cholecalciferol (VITAMIN D) 2000 UNITS tablet Take 2,000 Units by mouth daily.      citalopram (CELEXA) 20 MG tablet Take 10 mg by mouth daily.      Cyanocobalamin (VITAMIN B-12) 2500 MCG SUBL Place 1 tablet under the tongue daily.      fish oil-omega-3 fatty acids 1000 MG capsule Take 2 g by mouth daily.      glipiZIDE (GLUCOTROL XL) 10 MG 24 hr tablet Take 10 mg by mouth 2 (  two) times daily.      hydrochlorothiazide (HYDRODIURIL) 25 MG tablet Take 12.5 mg by mouth daily.    letrozole (FEMARA) 2.5 MG tablet Take 2.5 mg by mouth daily.      lisinopril (PRINIVIL,ZESTRIL) 10 MG tablet Take 10 mg by mouth every evening.      Magnesium 250 MG TABS Take 2 tablets by mouth daily.      metFORMIN (GLUCOPHAGE) 500 MG tablet Take 500 mg by mouth 2 (two) times daily.     propranolol (INDERAL) 40 MG tablet Take 20 mg by mouth 2 (two) times daily.      insulin glargine (LANTUS SOLOSTAR) 100 UNIT/ML injection Inject 5 Units into the skin daily as needed. For blood sugar over 120        Outstanding Labs/Studies  Duration of Discharge Encounter: Greater than 30 minutes including physician time.  Jolene Provost PA-C 03/28/2011 10:12 AM

## 2011-03-28 NOTE — Progress Notes (Signed)
Subjective:  No complaints  Objective:  Vital Signs in the last 24 hours: Temp:  [97.7 F (36.5 C)-98.2 F (36.8 C)] 97.7 F (36.5 C) (01/16 0528) Pulse Rate:  [52-95] 60  (01/16 0528) Resp:  [18-19] 19  (01/16 0528) BP: (126-154)/(75-76) 134/75 mmHg (01/16 0528) SpO2:  [92 %-93 %] 93 % (01/16 0528)  Intake/Output from previous day:  Intake/Output Summary (Last 24 hours) at 03/28/11 0946 Last data filed at 03/27/11 2159  Gross per 24 hour  Intake      3 ml  Output    300 ml  Net   -297 ml    Physical Exam: General appearance: alert, cooperative and no distress Lungs: clear to auscultation bilaterally Heart: regular rate and rhythm pacer site without hematoma, some echymosis ABD soft; BS+ No edema  Rate: 82  Rhythm: paced  Lab Results:  Basename 03/27/11 1510 03/27/11 0933  WBC 4.8 6.0  HGB 11.6* 12.6  PLT 187 223    Basename 03/27/11 1510 03/27/11 0933  NA -- 136  K -- 4.7  CL -- 98  CO2 -- 26  GLUCOSE -- 140*  BUN -- 17  CREATININE 0.81 1.01   No results found for this basename: TROPONINI:2,CK,MB:2 in the last 72 hours Hepatic Function Panel No results found for this basename: PROT,ALBUMIN,AST,ALT,ALKPHOS,BILITOT,BILIDIR,IBILI in the last 72 hours No results found for this basename: CHOL in the last 72 hours  Basename 03/27/11 0933  INR 0.96    Imaging: Imaging results have been reviewed, No ptx  Cardiac Studies:  Assessment/Plan:   Principal Problem:  *Fatigue, secondary to bradycardia, s/p PTVDP this adm  Active Problems:  Bradycardia, symptomatic  LBBB (left bundle branch block)  First degree AV block  DM (diabetes mellitus), IDDM  History of breast cancer, right, T3, N1, mastectomy 02/27/2007.  Plan-Home today, f/u one week with Dr Lacie Scotts PA-C 03/28/2011, 9:46 AM  Patient seen and examined. Agree with assessment and plan.  Pacer site without hematoma or ecchymosis.  CXR stable. DC today.   Lennette Bihari, MD,  [facility] 03/28/2011 9:54 AM

## 2011-03-31 ENCOUNTER — Telehealth: Payer: Self-pay | Admitting: Oncology

## 2011-03-31 NOTE — Telephone Encounter (Signed)
S/w the pt and she is aware of her march 2013 appts 

## 2011-04-11 ENCOUNTER — Other Ambulatory Visit: Payer: Medicare Other | Admitting: Lab

## 2011-05-11 ENCOUNTER — Other Ambulatory Visit (HOSPITAL_BASED_OUTPATIENT_CLINIC_OR_DEPARTMENT_OTHER): Payer: Medicare Other | Admitting: Lab

## 2011-05-11 DIAGNOSIS — Z170 Estrogen receptor positive status [ER+]: Secondary | ICD-10-CM

## 2011-05-11 DIAGNOSIS — C50419 Malignant neoplasm of upper-outer quadrant of unspecified female breast: Secondary | ICD-10-CM

## 2011-05-11 LAB — CBC WITH DIFFERENTIAL/PLATELET
EOS%: 1.8 % (ref 0.0–7.0)
MCH: 32 pg (ref 25.1–34.0)
MCV: 92.4 fL (ref 79.5–101.0)
MONO%: 7.9 % (ref 0.0–14.0)
NEUT#: 6 10*3/uL (ref 1.5–6.5)
RBC: 4.01 10*6/uL (ref 3.70–5.45)
RDW: 12.4 % (ref 11.2–14.5)

## 2011-05-12 LAB — VITAMIN D 25 HYDROXY (VIT D DEFICIENCY, FRACTURES): Vit D, 25-Hydroxy: 47 ng/mL (ref 30–89)

## 2011-05-12 LAB — COMPREHENSIVE METABOLIC PANEL
ALT: 10 U/L (ref 0–35)
AST: 18 U/L (ref 0–37)
Albumin: 4.3 g/dL (ref 3.5–5.2)
Alkaline Phosphatase: 66 U/L (ref 39–117)
Potassium: 4 mEq/L (ref 3.5–5.3)
Sodium: 133 mEq/L — ABNORMAL LOW (ref 135–145)
Total Protein: 6.5 g/dL (ref 6.0–8.3)

## 2011-05-18 ENCOUNTER — Ambulatory Visit (HOSPITAL_BASED_OUTPATIENT_CLINIC_OR_DEPARTMENT_OTHER): Payer: Medicare Other | Admitting: Oncology

## 2011-05-18 VITALS — BP 138/80 | HR 84 | Temp 98.5°F | Ht 66.0 in | Wt 222.5 lb

## 2011-05-18 DIAGNOSIS — Z853 Personal history of malignant neoplasm of breast: Secondary | ICD-10-CM

## 2011-05-18 DIAGNOSIS — Z79811 Long term (current) use of aromatase inhibitors: Secondary | ICD-10-CM

## 2011-05-18 MED ORDER — LETROZOLE 2.5 MG PO TABS: 2.5000 mg | ORAL_TABLET | Freq: Every day | ORAL | Status: DC

## 2011-05-18 NOTE — Progress Notes (Signed)
Hematology and Oncology Follow Up Visit  [name] [id number] 01-Dec-1934 77 y.o. 05/18/2011 10:29 AM PCP  Principle Diagnosis: :  Locally advanced breast cancer, status post neoadjuvant chemotherapy, she has six cycles of FEC followed by Taxotere, status post mastectomy on 02/17/2007.  ER/PR positive on Femara, status post radiation therapy completed in April 2009.   Interim History:  There have been no intercurrent illness, hospitalizations or medication changes. She has had a pacemaker placed in jan/13, she was bradycardic. Her LFT were extremely abnormal back in July and they have normalized. Medications: I have reviewed the patient's current medications.  Allergies:  Allergies  Allergen Reactions  . Oxycontin Nausea And Vomiting  . Crestor (Rosuvastatin Calcium) Other (See Comments)    dizziness  . Statins     Cramping in legs  . Zetia (Ezetimibe) Other (See Comments)    dizziness    Past Medical History, Surgical history, Social history, and Family History were reviewed and updated.  Review of Systems: Constitutional:  Negative for fever, chills, night sweats, anorexia, weight loss, pain. Cardiovascular: negative Respiratory: negative Neurological: negative Dermatological: negative ENT: negative Skin Gastrointestinal: negative Genito-Urinary: negative Hematological and Lymphatic: negative Breast: negative Musculoskeletal: negative Remaining ROS negative.  Physical Exam: Blood pressure 138/80, pulse 84, temperature 98.5 F (36.9 C), temperature source Oral, height 5\' 6"  (1.676 m), weight 222 lb 8 oz (100.925 kg). ECOG:  General appearance: alert, cooperative and appears stated age Head: Normocephalic, without obvious abnormality, atraumatic Neck: no adenopathy, no carotid bruit, no JVD, supple, symmetrical, trachea midline and thyroid not enlarged, symmetric, no tenderness/mass/nodules Lymph nodes: Cervical, supraclavicular, and axillary nodes normal. Cardiac :  regular rate and rhythm, no murmurs or gallops Pulmonary:clear to auscultation bilaterally and normal percussion bilaterally Breasts: inspection negative, no nipple discharge or bleeding, no masses or nodularity palpable and s/p rt mrm, NED; lt breast and axila are normal. Abdomen:soft, non-tender; bowel sounds normal; no masses,  no organomegaly Extremities negative Neuro: alert, oriented, normal speech, no focal findings or movement disorder noted  Lab Results: Lab Results  Component Value Date   WBC 8.3 05/11/2011   HGB 12.8 05/11/2011   HCT 37.1 05/11/2011   MCV 92.4 05/11/2011   PLT 224 05/11/2011     Chemistry      Component Value Date/Time   NA 133* 05/11/2011 1259   K 4.0 05/11/2011 1259   CL 96 05/11/2011 1259   CO2 24 05/11/2011 1259   BUN 16 05/11/2011 1259   CREATININE 0.89 05/11/2011 1259      Component Value Date/Time   CALCIUM 9.7 05/11/2011 1259   ALKPHOS 66 05/11/2011 1259   AST 18 05/11/2011 1259   ALT 10 05/11/2011 1259   BILITOT 0.7 05/11/2011 1259      .pathology. Radiological Studies: chest X-ray n/a Mammogram Due 12/13 Bone density Due 12/13  Impression and Plan: Doing well, NED ; 4th yr of femara therapy. Will see in 6 months with imaging studies.  More than 50% of the visit was spent in patient-related counselling   Pierce Crane, MD 3/8/201310:29 AM

## 2011-05-20 ENCOUNTER — Emergency Department (HOSPITAL_COMMUNITY): Payer: Medicare Other

## 2011-05-20 ENCOUNTER — Encounter (HOSPITAL_COMMUNITY): Payer: Self-pay

## 2011-05-20 ENCOUNTER — Inpatient Hospital Stay (HOSPITAL_COMMUNITY)
Admission: EM | Admit: 2011-05-20 | Discharge: 2011-05-25 | DRG: 069 | Disposition: A | Discharge status: Home / Self Care | Payer: Medicare Other | Attending: Internal Medicine | Admitting: Internal Medicine

## 2011-05-20 ENCOUNTER — Inpatient Hospital Stay (HOSPITAL_COMMUNITY): Payer: Medicare Other

## 2011-05-20 ENCOUNTER — Other Ambulatory Visit: Payer: Self-pay

## 2011-05-20 DIAGNOSIS — I440 Atrioventricular block, first degree: Secondary | ICD-10-CM

## 2011-05-20 DIAGNOSIS — E871 Hypo-osmolality and hyponatremia: Secondary | ICD-10-CM | POA: Diagnosis present

## 2011-05-20 DIAGNOSIS — Y849 Medical procedure, unspecified as the cause of abnormal reaction of the patient, or of later complication, without mention of misadventure at the time of the procedure: Secondary | ICD-10-CM | POA: Diagnosis present

## 2011-05-20 DIAGNOSIS — E860 Dehydration: Secondary | ICD-10-CM

## 2011-05-20 DIAGNOSIS — Z973 Presence of spectacles and contact lenses: Secondary | ICD-10-CM

## 2011-05-20 DIAGNOSIS — I639 Cerebral infarction, unspecified: Secondary | ICD-10-CM | POA: Diagnosis present

## 2011-05-20 DIAGNOSIS — Z950 Presence of cardiac pacemaker: Secondary | ICD-10-CM | POA: Diagnosis present

## 2011-05-20 DIAGNOSIS — R739 Hyperglycemia, unspecified: Secondary | ICD-10-CM | POA: Diagnosis present

## 2011-05-20 DIAGNOSIS — I10 Essential (primary) hypertension: Secondary | ICD-10-CM

## 2011-05-20 DIAGNOSIS — I771 Stricture of artery: Secondary | ICD-10-CM

## 2011-05-20 DIAGNOSIS — G250 Essential tremor: Secondary | ICD-10-CM | POA: Diagnosis present

## 2011-05-20 DIAGNOSIS — G459 Transient cerebral ischemic attack, unspecified: Principal | ICD-10-CM | POA: Diagnosis present

## 2011-05-20 DIAGNOSIS — Z7901 Long term (current) use of anticoagulants: Secondary | ICD-10-CM

## 2011-05-20 DIAGNOSIS — Z8673 Personal history of transient ischemic attack (TIA), and cerebral infarction without residual deficits: Secondary | ICD-10-CM

## 2011-05-20 DIAGNOSIS — I4891 Unspecified atrial fibrillation: Secondary | ICD-10-CM | POA: Diagnosis present

## 2011-05-20 DIAGNOSIS — R1110 Vomiting, unspecified: Secondary | ICD-10-CM

## 2011-05-20 DIAGNOSIS — Z853 Personal history of malignant neoplasm of breast: Secondary | ICD-10-CM

## 2011-05-20 DIAGNOSIS — E785 Hyperlipidemia, unspecified: Secondary | ICD-10-CM | POA: Diagnosis present

## 2011-05-20 DIAGNOSIS — T82898A Other specified complication of vascular prosthetic devices, implants and grafts, initial encounter: Secondary | ICD-10-CM | POA: Diagnosis present

## 2011-05-20 DIAGNOSIS — R5383 Other fatigue: Secondary | ICD-10-CM

## 2011-05-20 DIAGNOSIS — Z96659 Presence of unspecified artificial knee joint: Secondary | ICD-10-CM

## 2011-05-20 DIAGNOSIS — R001 Bradycardia, unspecified: Secondary | ICD-10-CM

## 2011-05-20 DIAGNOSIS — R531 Weakness: Secondary | ICD-10-CM | POA: Diagnosis present

## 2011-05-20 DIAGNOSIS — R42 Dizziness and giddiness: Secondary | ICD-10-CM

## 2011-05-20 DIAGNOSIS — E119 Type 2 diabetes mellitus without complications: Secondary | ICD-10-CM | POA: Diagnosis present

## 2011-05-20 DIAGNOSIS — Z9010 Acquired absence of unspecified breast and nipple: Secondary | ICD-10-CM

## 2011-05-20 DIAGNOSIS — I447 Left bundle-branch block, unspecified: Secondary | ICD-10-CM

## 2011-05-20 DIAGNOSIS — Z972 Presence of dental prosthetic device (complete) (partial): Secondary | ICD-10-CM

## 2011-05-20 LAB — CBC
Hemoglobin: 12.5 g/dL (ref 12.0–15.0)
MCH: 31.6 pg (ref 26.0–34.0)
MCHC: 34.4 g/dL (ref 30.0–36.0)
RDW: 12.3 % (ref 11.5–15.5)

## 2011-05-20 LAB — DIFFERENTIAL
Basophils Absolute: 0 10*3/uL (ref 0.0–0.1)
Basophils Relative: 0 % (ref 0–1)
Eosinophils Absolute: 0.2 10*3/uL (ref 0.0–0.7)
Monocytes Relative: 10 % (ref 3–12)
Neutro Abs: 4.7 10*3/uL (ref 1.7–7.7)
Neutrophils Relative %: 69 % (ref 43–77)

## 2011-05-20 LAB — PROTIME-INR
INR: 1.4 (ref 0.00–1.49)
Prothrombin Time: 17.4 seconds — ABNORMAL HIGH (ref 11.6–15.2)

## 2011-05-20 LAB — COMPREHENSIVE METABOLIC PANEL
AST: 20 U/L (ref 0–37)
Albumin: 3.3 g/dL — ABNORMAL LOW (ref 3.5–5.2)
Alkaline Phosphatase: 52 U/L (ref 39–117)
BUN: 18 mg/dL (ref 6–23)
Chloride: 95 mEq/L — ABNORMAL LOW (ref 96–112)
Creatinine, Ser: 0.88 mg/dL (ref 0.50–1.10)
Potassium: 4.7 mEq/L (ref 3.5–5.1)
Total Protein: 6.4 g/dL (ref 6.0–8.3)

## 2011-05-20 LAB — POCT I-STAT, CHEM 8
Calcium, Ion: 1.14 mmol/L (ref 1.12–1.32)
Creatinine, Ser: 1 mg/dL (ref 0.50–1.10)
Glucose, Bld: 207 mg/dL — ABNORMAL HIGH (ref 70–99)
Hemoglobin: 12.2 g/dL (ref 12.0–15.0)
Sodium: 134 mEq/L — ABNORMAL LOW (ref 135–145)
TCO2: 28 mmol/L (ref 0–100)

## 2011-05-20 LAB — CK TOTAL AND CKMB (NOT AT ARMC)
CK, MB: 3.1 ng/mL (ref 0.3–4.0)
Total CK: 85 U/L (ref 7–177)

## 2011-05-20 LAB — TROPONIN I: Troponin I: <0.3 ng/mL (ref <0.30)

## 2011-05-20 LAB — APTT: aPTT: 47 seconds — ABNORMAL HIGH (ref 24–37)

## 2011-05-20 MED ORDER — CITALOPRAM HYDROBROMIDE 10 MG PO TABS
10.0000 mg | ORAL_TABLET | Freq: Every day | ORAL | Status: DC
  Administered 2011-05-21 – 2011-05-25 (×4): 10 mg via ORAL
  Filled 2011-05-20 (×5): qty 1

## 2011-05-20 MED ORDER — CALCIUM 600 MG PO TABS: ORAL_TABLET | Freq: Every day | ORAL | Status: DC

## 2011-05-20 MED ORDER — MAGNESIUM OXIDE 400 MG PO TABS
400.0000 mg | ORAL_TABLET | Freq: Every day | ORAL | Status: DC
  Administered 2011-05-21 – 2011-05-25 (×4): 400 mg via ORAL
  Filled 2011-05-20 (×5): qty 1

## 2011-05-20 MED ORDER — OMEGA-3 FATTY ACIDS 1000 MG PO CAPS: 2.0000 g | ORAL_CAPSULE | Freq: Every day | ORAL | Status: DC

## 2011-05-20 MED ORDER — MAGNESIUM 250 MG PO TABS
2.0000 | ORAL_TABLET | Freq: Every day | ORAL | Status: DC
Start: 2011-05-20 — End: 2011-05-20

## 2011-05-20 MED ORDER — SODIUM CHLORIDE 0.9 % IV SOLN
INTRAVENOUS | Status: DC
  Administered 2011-05-20: 21:00:00 via INTRAVENOUS

## 2011-05-20 MED ORDER — SENNOSIDES-DOCUSATE SODIUM 8.6-50 MG PO TABS: 1.0000 | ORAL_TABLET | Freq: Every evening | ORAL | Status: DC | PRN

## 2011-05-20 MED ORDER — VITAMIN B-12 2500 MCG SL SUBL: 1.0000 | SUBLINGUAL_TABLET | Freq: Every day | SUBLINGUAL | Status: DC

## 2011-05-20 MED ORDER — WARFARIN SODIUM 10 MG PO TABS
10.0000 mg | ORAL_TABLET | Freq: Once | ORAL | Status: AC
  Administered 2011-05-21: 10 mg via ORAL
  Filled 2011-05-20: qty 1

## 2011-05-20 MED ORDER — LETROZOLE 2.5 MG PO TABS
2.5000 mg | ORAL_TABLET | Freq: Every day | ORAL | Status: DC
  Administered 2011-05-21 – 2011-05-25 (×4): 2.5 mg via ORAL
  Filled 2011-05-20 (×5): qty 1

## 2011-05-20 MED ORDER — WARFARIN - PHARMACIST DOSING INPATIENT: Freq: Every day | Status: DC

## 2011-05-20 MED ORDER — ONDANSETRON HCL 4 MG/2ML IJ SOLN: 4.0000 mg | Freq: Four times a day (QID) | INTRAMUSCULAR | Status: DC | PRN

## 2011-05-20 MED ORDER — CALCIUM CARBONATE 1250 (500 CA) MG PO TABS
1.0000 | ORAL_TABLET | Freq: Every day | ORAL | Status: DC
  Administered 2011-05-21 – 2011-05-25 (×4): 500 mg via ORAL
  Filled 2011-05-20 (×5): qty 1

## 2011-05-20 MED ORDER — PROPRANOLOL HCL 20 MG PO TABS
20.0000 mg | ORAL_TABLET | Freq: Two times a day (BID) | ORAL | Status: DC
  Administered 2011-05-20 – 2011-05-25 (×9): 20 mg via ORAL
  Filled 2011-05-20 (×13): qty 1

## 2011-05-20 MED ORDER — GLIPIZIDE ER 10 MG PO TB24
10.0000 mg | ORAL_TABLET | Freq: Two times a day (BID) | ORAL | Status: DC
  Administered 2011-05-21 – 2011-05-24 (×8): 10 mg via ORAL
  Filled 2011-05-20 (×11): qty 1

## 2011-05-20 MED ORDER — VITAMIN B-12 1000 MCG PO TABS
2500.0000 ug | ORAL_TABLET | Freq: Every day | ORAL | Status: DC
  Administered 2011-05-21 – 2011-05-25 (×4): 2500 ug via ORAL
  Filled 2011-05-20 (×5): qty 1

## 2011-05-20 MED ORDER — OMEGA-3-ACID ETHYL ESTERS 1 G PO CAPS
2.0000 g | ORAL_CAPSULE | Freq: Every day | ORAL | Status: DC
  Administered 2011-05-21 – 2011-05-25 (×4): 2 g via ORAL
  Filled 2011-05-20 (×5): qty 2

## 2011-05-20 MED ORDER — VITAMIN D 50 MCG (2000 UT) PO TABS: 2000.0000 [IU] | ORAL_TABLET | Freq: Every day | ORAL | Status: DC

## 2011-05-20 MED ORDER — LISINOPRIL 10 MG PO TABS
10.0000 mg | ORAL_TABLET | Freq: Every day | ORAL | Status: DC
  Administered 2011-05-20 – 2011-05-24 (×5): 10 mg via ORAL
  Filled 2011-05-20 (×7): qty 1

## 2011-05-20 MED ORDER — VITAMIN D3 25 MCG (1000 UNIT) PO TABS
2000.0000 [IU] | ORAL_TABLET | Freq: Every day | ORAL | Status: DC
  Administered 2011-05-21 – 2011-05-25 (×4): 2000 [IU] via ORAL
  Filled 2011-05-20 (×5): qty 2

## 2011-05-20 NOTE — ED Notes (Signed)
Pt arrived to the CT

## 2011-05-20 NOTE — ED Notes (Signed)
Phlebotomist at the bedside.  

## 2011-05-20 NOTE — ED Provider Notes (Signed)
History     CSN: [id number]  Arrival date & time 05/20/11  1228   First MD Initiated Contact with Patient 05/20/11 1241      Chief Complaint  Patient presents with  . Numbness   PCP Revonda [facility]  (Consider location/radiation/quality/duration/timing/severity/associated sxs/prior treatment) HPI 77 year old female has had prior stroke with intermittent vertigo since that time with chronic unsteady gait since that time, she now presents with feeling normal at 10:00 this morning when she had sudden onset of left arm and leg weakness and numbness which was severe and she was unable to move that side of the body but is now much improved and is now quite back to normal yet but is much better than when she was at home. The numbness and weakness of both much improved but not quite back to normal. She has no pain today. She is no headache no altered mental status no change in speech vision swallowing or understanding no chest pain palpitations lightheadedness vertigo today abdominal pain nausea or vomiting. She has no trauma. She felt well this morning. She was awakened she the symptom onset at 10:00 today. Code Stroke was activated shortly after arrival to the emergency room today.  Past Medical History  Diagnosis Date  . Diabetes mellitus     Type 2  . Stroke     TIA per patient history form dated 02/15/10.  . Goiter   . Arthritis   . Dizziness - light-headed   . Family history of breast cancer     Did not specify what member of family per history form dated 02/15/10.  . Menopause   . Cancer     rt. breast ca  . Slow heart rate   . Hyperlipidemia   . Hypertension   . PONV (postoperative nausea and vomiting)     Past Surgical History  Procedure Date  . Joint replacement     Knee - ask patient to clarify which knee or both.  . Breast surgery     lumpectomy  . Brain surgery     Stent placement  . Gallbladder surgery   . Mastectomy   . Thyroid surgery     due  to Goiter  . Cholecystectomy   . Total knee arthroplasty 2008    left    Family History  Problem Relation Age of Onset  . COPD Mother   . Cancer Father     lung, colon  . Cancer Maternal Aunt     breast  . Cancer Maternal Grandmother     breast    History  Substance Use Topics  . Smoking status: Never Smoker   . Smokeless tobacco: Not on file  . Alcohol Use: No    OB History    Grav Para Term Preterm Abortions TAB SAB Ect Mult Living                  Review of Systems  Constitutional: Negative for fever.       10 Systems reviewed and are negative for acute change except as noted in the HPI.  HENT: Negative for congestion.   Eyes: Negative for discharge and redness.  Respiratory: Negative for cough and shortness of breath.   Cardiovascular: Negative for chest pain.  Gastrointestinal: Negative for vomiting and abdominal pain.  Musculoskeletal: Negative for back pain.  Skin: Negative for rash.  Neurological: Positive for weakness and numbness. Negative for dizziness, syncope, facial asymmetry, speech difficulty, light-headedness and headaches.  Psychiatric/Behavioral:  No behavior change.    Allergies  Oxycontin; Crestor; Statins; and Zetia  Home Medications   Current Outpatient Rx  Name Route Sig Dispense Refill  . CALCIUM PO Oral Take 630 mg by mouth daily.     Marland Kitchen VITAMIN D 2000 UNITS PO TABS Oral Take 2,000 Units by mouth daily.      Marland Kitchen CITALOPRAM HYDROBROMIDE 20 MG PO TABS Oral Take 10 mg by mouth daily.      Marland Kitchen VITAMIN B-12 2500 MCG SL SUBL Sublingual Place 1 tablet under the tongue daily.      . OMEGA-3 FATTY ACIDS 1000 MG PO CAPS Oral Take 2 g by mouth daily.      Marland Kitchen GLIPIZIDE ER 10 MG PO TB24 Oral Take 10 mg by mouth 2 (two) times daily.      Marland Kitchen HYDROCHLOROTHIAZIDE 25 MG PO TABS Oral Take 12.5 mg by mouth daily.    Marland Kitchen LETROZOLE 2.5 MG PO TABS Oral Take 1 tablet (2.5 mg total) by mouth daily. 30 tablet 11  . LISINOPRIL 10 MG PO TABS Oral Take 10 mg by  mouth every evening.      Marland Kitchen MAGNESIUM 250 MG PO TABS Oral Take 2 tablets by mouth daily.      Marland Kitchen METFORMIN HCL 500 MG PO TABS Oral Take 500 mg by mouth 2 (two) times daily.     Marland Kitchen PROPRANOLOL HCL 40 MG PO TABS Oral Take 20 mg by mouth 2 (two) times daily.     . ASPIRIN 81 MG PO TBEC Oral Take 2 tablets (162 mg total) by mouth daily after breakfast.    . CLOPIDOGREL BISULFATE 75 MG PO TABS Oral Take 1 tablet (75 mg total) by mouth daily with breakfast. 30 tablet 1  . PRAVASTATIN SODIUM 20 MG PO TABS Oral Take 1 tablet (20 mg total) by mouth daily at 6 PM. 30 tablet 1    BP 129/73  Pulse 62  Temp(Src) 98.2 F (36.8 C) (Oral)  Resp 18  Ht 5\' 6"  (1.676 m)  Wt 219 lb (99.338 kg)  BMI 35.35 kg/m2  SpO2 94%  Physical Exam  Nursing note and vitals reviewed. Constitutional:       Awake, alert, nontoxic appearance with baseline speech for patient.  HENT:  Head: Atraumatic.  Mouth/Throat: No oropharyngeal exudate.  Eyes: EOM are normal. Pupils are equal, round, and reactive to light. Right eye exhibits no discharge. Left eye exhibits no discharge.  Neck: Neck supple.  Cardiovascular: Normal rate and regular rhythm.   No murmur heard. Pulmonary/Chest: Effort normal and breath sounds normal. No stridor. No respiratory distress. She has no wheezes. She has no rales. She exhibits no tenderness.  Abdominal: Soft. Bowel sounds are normal. She exhibits no mass. There is no tenderness. There is no rebound.  Musculoskeletal: She exhibits no tenderness.       Baseline ROM, moves extremities with no obvious new focal weakness.  Lymphadenopathy:    She has no cervical adenopathy.  Neurological: She is alert.       Awake, alert, cooperative and aware of situation; motor strength 5 out of 5 on the right arm and leg are 4 out of 5 in the left arm and leg; sensation normal to light touch right side with decreased light touch to the left arm and leg; peripheral visual fields full to confrontation; no facial  asymmetry; tongue midline; major cranial nerves appear intact; no pronator drift, normal finger to nose bilaterally the patient has more difficulty moving  the left arm to her nose due to some weakness  Skin: No rash noted.  Psychiatric: She has a normal mood and affect.    ED Course  Procedures (including critical care time) ECG: Paced atrial rhythm, ventricular rate 68, right bundle branch block, first degree AV block with occasional native beats, compared with January 2013 paced ventricle is no longer present Labs Reviewed  PROTIME-INR - Abnormal; Notable for the following:    Prothrombin Time 17.4 (*)    All other components within normal limits  APTT - Abnormal; Notable for the following:    aPTT 47 (*)    All other components within normal limits  COMPREHENSIVE METABOLIC PANEL - Abnormal; Notable for the following:    Sodium 129 (*)    Chloride 95 (*)    Glucose, Bld 199 (*)    Albumin 3.3 (*)    GFR calc non Af Amer 63 (*)    GFR calc Af Amer 73 (*)    All other components within normal limits  POCT I-STAT, CHEM 8 - Abnormal; Notable for the following:    Sodium 134 (*)    Glucose, Bld 207 (*)    All other components within normal limits  HEMOGLOBIN A1C - Abnormal; Notable for the following:    Hemoglobin A1C 6.9 (*)    Mean Plasma Glucose 151 (*)    All other components within normal limits  PROTIME-INR - Abnormal; Notable for the following:    Prothrombin Time 21.2 (*)    INR 1.80 (*)    All other components within normal limits  BASIC METABOLIC PANEL - Abnormal; Notable for the following:    GFR calc non Af Amer 49 (*)    GFR calc Af Amer 57 (*)    All other components within normal limits  GLUCOSE, CAPILLARY - Abnormal; Notable for the following:    Glucose-Capillary 155 (*)    All other components within normal limits  LIPID PANEL - Abnormal; Notable for the following:    Cholesterol 226 (*)    Triglycerides 182 (*)    HDL 38 (*)    LDL Cholesterol 152 (*)     All other components within normal limits  GLUCOSE, CAPILLARY - Abnormal; Notable for the following:    Glucose-Capillary 199 (*)    All other components within normal limits  PROTIME-INR - Abnormal; Notable for the following:    Prothrombin Time 23.2 (*)    INR 2.02 (*)    All other components within normal limits  GLUCOSE, CAPILLARY - Abnormal; Notable for the following:    Glucose-Capillary 170 (*)    All other components within normal limits  BASIC METABOLIC PANEL - Abnormal; Notable for the following:    Sodium 132 (*)    Glucose, Bld 111 (*)    GFR calc non Af Amer 50 (*)    GFR calc Af Amer 58 (*)    All other components within normal limits  GLUCOSE, CAPILLARY - Abnormal; Notable for the following:    Glucose-Capillary 112 (*)    All other components within normal limits  GLUCOSE, CAPILLARY - Abnormal; Notable for the following:    Glucose-Capillary 122 (*)    All other components within normal limits  GLUCOSE, CAPILLARY - Abnormal; Notable for the following:    Glucose-Capillary 140 (*)    All other components within normal limits  GLUCOSE, CAPILLARY - Abnormal; Notable for the following:    Glucose-Capillary 171 (*)    All other components  within normal limits  PROTIME-INR - Abnormal; Notable for the following:    Prothrombin Time 22.7 (*)    INR 1.96 (*)    All other components within normal limits  CBC - Abnormal; Notable for the following:    RBC 3.75 (*)    Hemoglobin 11.6 (*)    HCT 33.9 (*)    All other components within normal limits  APTT - Abnormal; Notable for the following:    aPTT 56 (*)    All other components within normal limits  BASIC METABOLIC PANEL - Abnormal; Notable for the following:    GFR calc non Af Amer 66 (*)    GFR calc Af Amer 76 (*)    All other components within normal limits  GLUCOSE, CAPILLARY - Abnormal; Notable for the following:    Glucose-Capillary 211 (*)    All other components within normal limits  HEPARIN LEVEL  (UNFRACTIONATED) - Abnormal; Notable for the following:    Heparin Unfractionated 0.19 (*)    All other components within normal limits  PROTIME-INR - Abnormal; Notable for the following:    Prothrombin Time 17.8 (*)    All other components within normal limits  CBC - Abnormal; Notable for the following:    RBC 3.80 (*)    HCT 34.1 (*)    All other components within normal limits  HEPARIN LEVEL (UNFRACTIONATED) - Abnormal; Notable for the following:    Heparin Unfractionated 0.27 (*)    All other components within normal limits  GLUCOSE, CAPILLARY - Abnormal; Notable for the following:    Glucose-Capillary 130 (*)    All other components within normal limits  GLUCOSE, CAPILLARY - Abnormal; Notable for the following:    Glucose-Capillary 102 (*)    All other components within normal limits  GLUCOSE, CAPILLARY - Abnormal; Notable for the following:    Glucose-Capillary 101 (*)    All other components within normal limits  PROTIME-INR - Abnormal; Notable for the following:    Prothrombin Time 15.6 (*)    All other components within normal limits  CBC - Abnormal; Notable for the following:    RBC 3.86 (*)    HCT 34.8 (*)    All other components within normal limits  HEPARIN LEVEL (UNFRACTIONATED) - Abnormal; Notable for the following:    Heparin Unfractionated 0.23 (*)    All other components within normal limits  CBC  DIFFERENTIAL  CK TOTAL AND CKMB  TROPONIN I  GLUCOSE, CAPILLARY  GLUCOSE, CAPILLARY  GLUCOSE, CAPILLARY  GLUCOSE, CAPILLARY  GLUCOSE, CAPILLARY  GLUCOSE, CAPILLARY   No results found.   1. Stroke   2. DM (diabetes mellitus)   3. Hyperglycemia   4. History of breast cancer, right, T3, N1, mastectomy 02/27/2007.   5. Stenosis of artery   6. HTN (hypertension)   7. DM (diabetes mellitus), IDDM   8. Hyponatremia   9. Acute left-sided weakness   10. Pacemaker   11. Familial tremor   12. Wears glasses   13. Wears dentures   14. Vomiting   15. Dizziness    16. Bradycardia, symptomatic   17. Dehydration   18. LBBB (left bundle branch block)   19. First degree AV block   20. Fatigue, secondary to bradycardia       MDM  Pt feels improved after observation and/or treatment in ED.Patient / Family / Caregiver informed of clinical course, understand medical decision-making process, and agree with plan.The patient appears reasonably stabilized for admission considering the current  resources, flow, and capabilities available in the ED at this time, and I doubt any other [facility] requiring further screening and/or treatment in the ED prior to admission.tPA in stroke considered, but not given due to the following: Rapid improvement / severity too mild        Hurman Horn, MD 05/26/11 0000

## 2011-05-20 NOTE — Progress Notes (Signed)
ANTICOAGULATION CONSULT NOTE - Initial Consult  Pharmacy Consult for Coumadin Indication: atrial fibrillation and stroke  Allergies  Allergen Reactions  . Oxycontin Nausea And Vomiting  . Crestor (Rosuvastatin Calcium) Other (See Comments)    dizziness  . Statins     Cramping in legs  . Zetia (Ezetimibe) Other (See Comments)    dizziness    Patient Measurements: Height: 5\' 6"  (167.6 cm) Weight: 219 lb (99.338 kg) IBW/kg (Calculated) : 59.3   Vital Signs: Temp: 98 F (36.7 C) (03/10 1537) Temp src: Oral (03/10 1237) BP: 143/77 mmHg (03/10 1700) Pulse Rate: 65  (03/10 1700)  Labs:  Basename 05/20/11 1257 05/20/11 1247  HGB 12.2 12.5  HCT 36.0 36.3  PLT -- 205  APTT -- 47*  LABPROT -- 17.4*  INR -- 1.40  HEPARINUNFRC -- --  CREATININE 1.00 0.88  CKTOTAL -- 85  CKMB -- 3.1  TROPONINI -- <0.30   Estimated Creatinine Clearance: 58.7 ml/min (by C-G formula based on Cr of 1).  Medical History: Past Medical History  Diagnosis Date  . Diabetes mellitus     Type 2  . Stroke     TIA per patient history form dated 02/15/10.  . Goiter   . Arthritis   . Dizziness - light-headed   . Family history of breast cancer     Did not specify what member of family per history form dated 02/15/10.  . Menopause   . Cancer     rt. breast ca  . Slow heart rate   . Hyperlipidemia   . Hypertension   . PONV (postoperative nausea and vomiting)     Medications:  Scheduled:    . Calcium   Oral Daily  . citalopram  10 mg Oral Daily  . fish oil-omega-3 fatty acids  2 g Oral Daily  . glipiZIDE  10 mg Oral BID  . letrozole  2.5 mg Oral Daily  . lisinopril  10 mg Oral QPM  . Magnesium  2 tablet Oral Daily  . propranolol  20 mg Oral BID  . Vitamin B-12  1 tablet Sublingual Daily  . Vitamin D  2,000 Units Oral Daily    Assessment: Pt was started on warfarin approximately 10 days ago for new onset Afib.  She is currently taking warfarin 5mg  on MWF and 7.5mg  on TTSS.  She was  admitted today with CVA.  Her INR is subtherapeutic.  No bleeding noted.   Goal of Therapy:  INR 2-3   Plan:  1) Coumadin 10mg  po x 1 now 2) Daily protimes   Elson Clan 05/20/2011,6:28 PM

## 2011-05-20 NOTE — ED Notes (Signed)
Code stroke encoded and called

## 2011-05-20 NOTE — ED Notes (Signed)
IV nurse at the bedside

## 2011-05-20 NOTE — ED Notes (Signed)
Code Stroke was cancelled by Dr. Roseanne Reno

## 2011-05-20 NOTE — ED Notes (Signed)
Dr. Fonnie Jarvis at the bedside to examine the patient

## 2011-05-20 NOTE — ED Notes (Signed)
Dr. Fonnie Jarvis was made aware about the patient's symptoms

## 2011-05-20 NOTE — Code Documentation (Signed)
Encoded at 1240, called at 1240, Patient arrived to ED at 1228, EDP seen 1243, stroke team arrived at 1247, lab arrived at 1244, pt in CT at 3, CT read 1258.  LSN at 1000, Patient states she was sitting in a chair playing a game with family members and suddenly developed Left arm and leg heaviness and weakness.  She told family she thought she was having a stroke.  NIHSS 2.  Cancelled at 1312

## 2011-05-20 NOTE — ED Notes (Signed)
Pt was brought in by EMS with c/o Lt sided numbness and weakness onset at 1000 this morning. Pt is A/A/Ox4, skin warm and dry, respiration is even and unlabored

## 2011-05-20 NOTE — Consult Note (Signed)
Chief Complaint: Acute onset of left arm and leg weakness  HPI: [name] is an 77 y.o. female with a history of previous stroke, right middle cerebral artery stent placement, hypertension, diabetes mellitus and recently diagnosed atrial fibrillation presenting with acute onset of left arm and leg weakness at 10 AM today. Patient has improved significantly since the onset of weakness. She has been on Coumadin for 10-11 days. INR today is 1.4. CT scan of her head showed right MCA stent with no acute intracranial abnormalities. NIH score was 2. Patient was not a candidate for IV thrombolytic therapy with TPA because of rapidly resolving deficits. Patient is also status post pacemaker placement for bradycardia.  LSN: 1000 today tPA Given: No: Rapidly resolving deficits MRankin: 0  Past Medical History  Diagnosis Date  . Diabetes mellitus     Type 2  . Stroke     TIA per patient history form dated 02/15/10.  . Goiter   . Arthritis   . Dizziness - light-headed   . Family history of breast cancer     Did not specify what member of family per history form dated 02/15/10.  . Menopause   . Cancer     rt. breast ca  . Slow heart rate   . Hyperlipidemia   . Hypertension   . PONV (postoperative nausea and vomiting)     Family History  Problem Relation Age of Onset  . COPD Mother   . Cancer Father     lung, colon  . Cancer Maternal Aunt     breast  . Cancer Maternal Grandmother     breast     Medications:  Calcium 630 mg per day Vitamin D 2000 units per day Celexa 20 mg per day Vitamin B12 2500 mcg per day Omega-3 fatty acids 1000 mg, 2 per day Glipizide 10 mg twice a day Hydrochlorothiazide 12.5 mg per day Letrozole 2.5 mg per day lisinopril 10 mg per day magnesium 250 mg, 2 tablets per day Metformin 500 mg twice a day Inderal 20 mg twice a day Coumadin 5 mg per day  Physical Examination: Blood pressure 116/74, pulse 62, temperature 97.8 F (36.6 C), temperature  source Oral, resp. rate 16, height 5\' 6"  (1.676 m), weight 99.338 kg (219 lb), SpO2 97.00%.  Neurologic Examination: Mental Status: Alert, oriented, thought content appropriate.  Speech fluent without evidence of aphasia. Able to follow  commands without difficulty. Cranial Nerves: II-Visual fields were normal. III/IV/VI-Extraocular movements were full and conjugate.  Pupils reacted normally to light. V/VII-no facial numbness; slight left nasal labial flattening VIII-grossly intact IX/X-normal speech XII-midline tongue extension Motor: 5/5 bilaterally with normal tone and bulk except for slight drift of the left lower extremity against gravity Sensory: light touch intact throughout, bilaterally Deep Tendon Reflexes: 1+ and symmetric throughout Plantars: Downgoing bilaterally Cerebellar: Normal finger-to-nose and normal heel-to-shin test.   Ct Head Wo Contrast  05/20/2011  *RADIOLOGY REPORT*  Clinical Data: Left arm weakness and heaviness today.  Code stroke.  CT HEAD WITHOUT CONTRAST  Technique:  Contiguous axial images were obtained from the base of the skull through the vertex without contrast.  Comparison: Head CT 03/01/2011.  Findings: Stent within right middle cerebral artery (M1 segment) unchanged compared to the prior examination.  Mild cerebral and cerebellar atrophy with mild chronic microvascular ischemic changes in the periventricular white matter of the cerebral hemispheres bilaterally.  No definite acute intracranial abnormality. Specifically, no definite signs of acute/subacute ischemia, no focal mass, mass effect,  hydrocephalus or abnormal intra or extra- axial fluid collections.  Visualized paranasal sinuses and mastoids are well pneumatized.  No displaced skull fractures are noted.  IMPRESSION: 1.  No acute intracranial abnormalities. 2.  Mild cerebral and cerebellar atrophy with mild chronic microvascular ischemic changes in the white matter, as above.  These results were called  by telephone on 05/20/2011  at  01:10 p.m. to  Dr. Fonnie Jarvis, who verbally acknowledged these results.  Original Report Authenticated By: Florencia Reasons, M.D.    Assessment: 77 y.o. female with resolving acute weakness of the left arm and leg, and to a lesser extent left lower face. TIA is likely. However small subcortical right MCA territory stroke cannot be ruled out. 2. Atrial fibrillation on subtherapeutic dose of heparin. 3. Status post right MCA stent placement. 4. History of previous right CVA.  Stroke Risk Factors - atrial fibrillation, diabetes mellitus and hypertension  Plan: 1. HgbA1c, fasting lipid panel 2. CTA of the head and neck 3. PT consult, OT consult, Speech consult 4. Echocardiogram 5. Prophylactic therapy-Anticoagulation: Coumadin 6. Risk factor modification 7. Telemetry monitoring  C.R. Roseanne Reno, MD [facility] [phone]  05/20/2011, 1:34 PM

## 2011-05-20 NOTE — ED Notes (Signed)
Stroke team arrived and at the bedside

## 2011-05-20 NOTE — ED Notes (Signed)
Was brought in by EMS with c/o numbness and weakness to lt arm and lt leg onset at 1000 when she was sitting on her recliner and when she attempted to go the bathroom, she noticed that her lt leg was weak. Pt is A/A/Ox4, skin is warm and dry, respiration is even and unlabored. Pt is attached to the monitor

## 2011-05-20 NOTE — H&P (Signed)
PCP:   [name], MD, MD   Chief Complaint:  Left sided numbness and weakness  HPI: Pt is a 76yo WF with PMH significant for CVA in past with longstanding dizziness/vertigo, balance/gait difficulties since then, recently diagnosed afib, DM, HTN, hyperlipidemia, h/o brady and s/p pacemaker(03/27/11) who presents with the above complaints. She states that at about 10am while sitting down, she just suddenly got the feeling that 'something was very wrong '- she developed LUE numbness, clumsiness. When she tried to stand she noticed that both her LUE  And LLE extremity were quite weak and so she was brought to the ED. Code stroke was called and pt was seen by neurology,  CT scan of  Neg for acute abnormalities. The pt reported that her weakness was much improved in the ED but not at baseline yet. She is admitted for further eval and management. It is noted that pt was started on coumadin in Jan 2013 when her strips revealed afib, her INR was subtherapeutic in the ED. She admits to blurry vision,slurred speech. She denies dysphagia, cough, CP and no SOB.  Review of Systems:  The patient denies anorexia, fever, weight loss, decreased hearing, hoarseness, chest pain, syncope, dyspnea on exertion, peripheral edema, hemoptysis, abdominal pain, melena, hematochezia, severe indigestion/heartburn, hematuria, incontinence, transient blindness, depression, unusual weight change, abnormal bleeding.  Past Medical History: Past Medical History  Diagnosis Date  . Diabetes mellitus     Type 2  . Stroke     TIA per patient history form dated 02/15/10.  . Goiter   . Arthritis   . Dizziness - light-headed   . Family history of breast cancer     Did not specify what member of family per history form dated 02/15/10.  . Menopause   . Cancer     rt. breast ca  . Slow heart rate   . Hyperlipidemia   . Hypertension   . PONV (postoperative nausea and vomiting)    Past Surgical History  Procedure Date  . Joint  replacement     Knee - ask patient to clarify which knee or both.  . Breast surgery     lumpectomy  . Brain surgery     Stent placement  . Gallbladder surgery   . Mastectomy   . Thyroid surgery     due to Goiter  . Cholecystectomy   . Total knee arthroplasty 2008    left    Medications: Prior to Admission medications   Medication Sig Start Date End Date Taking? Authorizing Provider  CALCIUM PO Take 630 mg by mouth daily.    Yes Historical Provider, MD  Cholecalciferol (VITAMIN D) 2000 UNITS tablet Take 2,000 Units by mouth daily.     Yes Historical Provider, MD  citalopram (CELEXA) 20 MG tablet Take 10 mg by mouth daily.     Yes Historical Provider, MD  Cyanocobalamin (VITAMIN B-12) 2500 MCG SUBL Place 1 tablet under the tongue daily.     Yes Historical Provider, MD  fish oil-omega-3 fatty acids 1000 MG capsule Take 2 g by mouth daily.     Yes Historical Provider, MD  glipiZIDE (GLUCOTROL XL) 10 MG 24 hr tablet Take 10 mg by mouth 2 (two) times daily.     Yes Historical Provider, MD  hydrochlorothiazide (HYDRODIURIL) 25 MG tablet Take 12.5 mg by mouth daily.   Yes Historical Provider, MD  letrozole (FEMARA) 2.5 MG tablet Take 1 tablet (2.5 mg total) by mouth daily. 05/18/11  Yes Pierce Crane,  MD  lisinopril (PRINIVIL,ZESTRIL) 10 MG tablet Take 10 mg by mouth every evening.     Yes Historical Provider, MD  Magnesium 250 MG TABS Take 2 tablets by mouth daily.     Yes Historical Provider, MD  metFORMIN (GLUCOPHAGE) 500 MG tablet Take 500 mg by mouth 2 (two) times daily.    Yes Historical Provider, MD  propranolol (INDERAL) 40 MG tablet Take 20 mg by mouth 2 (two) times daily.    Yes Historical Provider, MD  warfarin (COUMADIN) 5 MG tablet Take 5 mg by mouth daily. 5mg  mwf and 7.5mg  all other days    Historical Provider, MD    Allergies:   Allergies  Allergen Reactions  . Oxycontin Nausea And Vomiting  . Crestor (Rosuvastatin Calcium) Other (See Comments)    dizziness  . Statins      Cramping in legs  . Zetia (Ezetimibe) Other (See Comments)    dizziness    Social History:  reports that she has never smoked. She does not have any smokeless tobacco history on file. She reports that she does not drink alcohol or use illicit drugs.  Family History: Family History  Problem Relation Age of Onset  . COPD Mother   . Cancer Father     lung, colon  . Cancer Maternal Aunt     breast  . Cancer Maternal Grandmother     breast    Physical Exam: Filed Vitals:   05/20/11 1415 05/20/11 1532 05/20/11 1537 05/20/11 1700  BP: 126/66 139/67  143/77  Pulse: 63 65  65  Temp:   98 F (36.7 C)   TempSrc:      Resp: 14 19  18   Height:      Weight:      SpO2: 94% 97%  98%   Constitutional: Vital signs reviewed.  Patient is a well-developed and well-nourished in no acute distress and cooperative with exam. Alert and oriented x3.  Head: Normocephalic and atraumatic Ear: TM normal bilaterally Mouth: no erythema or exudates, MMM Eyes: PERRL, EOMI, conjunctivae normal, No scleral icterus.  Neck: Supple, Trachea midline normal ROM, No JVD, mass, thyromegaly, or carotid bruit present.  Cardiovascular: RRR, S1 normal, S2 normal, no MRG, pulses symmetric and intact bilaterally Pulmonary/Chest: CTAB, no wheezes, rales, or rhonchi Abdominal: Soft. Non-tender, non-distended, bowel sounds are normal, no masses, organomegaly, or guarding present.  Extremities: no cyanosis, no edema  Neurological: A&O x3, Strength 4-5/5 and symmetric cranial nerve II-XII are grossly intact, sensory intact to light touch bilaterally.  Skin: Warm, dry and intact. No rash, cyanosis, or clubbing.       Labs on Admission:   [facility] 05/20/11 1257 05/20/11 1247  NA 134* 129*  K 4.7 4.7  CL 99 95*  CO2 -- 22  GLUCOSE 207* 199*  BUN 20 18  CREATININE 1.00 0.88  CALCIUM -- 9.2  MG -- --  PHOS -- --    Basename 05/20/11 1247  AST 20  ALT 12  ALKPHOS 52  BILITOT 0.3  PROT 6.4  ALBUMIN 3.3*    No results found for this basename: LIPASE:2,AMYLASE:2 in the last 72 hours  Basename 05/20/11 1257 05/20/11 1247  WBC -- 6.8  NEUTROABS -- 4.7  HGB 12.2 12.5  HCT 36.0 36.3  MCV -- 91.9  PLT -- 205    Basename 05/20/11 1247  CKTOTAL 85  CKMB 3.1  CKMBINDEX --  TROPONINI <0.30   No results found for this basename: TSH,T4TOTAL,FREET3,T3FREE,THYROIDAB in the last 72 hours  No results found for this basename: VITAMINB12:2,FOLATE:2,FERRITIN:2,TIBC:2,IRON:2,RETICCTPCT:2 in the last 72 hours  Radiological Exams on Admission: Ct Head Wo Contrast  05/20/2011  *RADIOLOGY REPORT*  Clinical Data: Left arm weakness and heaviness today.  Code stroke.  CT HEAD WITHOUT CONTRAST  Technique:  Contiguous axial images were obtained from the base of the skull through the vertex without contrast.  Comparison: Head CT 03/01/2011.  Findings: Stent within right middle cerebral artery (M1 segment) unchanged compared to the prior examination.  Mild cerebral and cerebellar atrophy with mild chronic microvascular ischemic changes in the periventricular white matter of the cerebral hemispheres bilaterally.  No definite acute intracranial abnormality. Specifically, no definite signs of acute/subacute ischemia, no focal mass, mass effect, hydrocephalus or abnormal intra or extra- axial fluid collections.  Visualized paranasal sinuses and mastoids are well pneumatized.  No displaced skull fractures are noted.  IMPRESSION: 1.  No acute intracranial abnormalities. 2.  Mild cerebral and cerebellar atrophy with mild chronic microvascular ischemic changes in the white matter, as above.  These results were called by telephone on 05/20/2011  at  01:10 p.m. to  Dr. Fonnie Jarvis, who verbally acknowledged these results.  Original Report Authenticated By: Florencia Reasons, M.D.   Dg Chest Portable 1 View  05/20/2011  *RADIOLOGY REPORT*  Clinical Data: Left-sided weakness.  PORTABLE CHEST - 1 VIEW  Comparison: 03/28/2011.  Findings:  The permanent pacer wires are stable.  The heart is enlarged but unchanged.  Low lung volumes with vascular crowding and bibasilar atelectasis but no infiltrates or effusions.  The bony thorax is intact.  IMPRESSION:  1.  Stable cardiac enlargement. 2.  Low lung volumes with vascular crowding and basilar atelectasis.  Original Report Authenticated By: P. Loralie Champagne, M.D.    Assessment/Plan Present on Admission:  .Acute left-sided weakness - TIA vs CVA - As discussed above, Stroke w/u, CTA of head and neck as per neuro recs- not able to do MRI due topacemaker -continue coumadin per pharmacy - PT OT consulted - Neuro to follow .DM (diabetes mellitus) -continue glipizide, and SSI .Hyponatremia - hold off HCTZ, hydrate and follow .Pacemaker .Familial tremor - continue propanolol H/o Bradycardia, symptomatic  -s/p pacemaker .Afib -rate controlled, continue coumadin .History of breast cancer, right, T3, N1, mastectomy 02/27/2007.  Mayu Ronk C 05/20/2011, 6:12 PM

## 2011-05-21 ENCOUNTER — Inpatient Hospital Stay (HOSPITAL_COMMUNITY): Payer: Medicare Other

## 2011-05-21 LAB — LIPID PANEL
HDL: 38 mg/dL — ABNORMAL LOW (ref >39)
LDL Cholesterol: 152 mg/dL — ABNORMAL HIGH (ref 0–99)
Total CHOL/HDL Ratio: 5.9 RATIO
Triglycerides: 182 mg/dL — ABNORMAL HIGH (ref <150)

## 2011-05-21 LAB — BASIC METABOLIC PANEL
BUN: 17 mg/dL (ref 6–23)
Creatinine, Ser: 1.08 mg/dL (ref 0.50–1.10)
GFR calc Af Amer: 57 mL/min — ABNORMAL LOW (ref >90)
GFR calc non Af Amer: 49 mL/min — ABNORMAL LOW (ref >90)
Potassium: 3.9 mEq/L (ref 3.5–5.1)

## 2011-05-21 LAB — PROTIME-INR
INR: 1.8 — ABNORMAL HIGH (ref 0.00–1.49)
Prothrombin Time: 21.2 seconds — ABNORMAL HIGH (ref 11.6–15.2)

## 2011-05-21 LAB — HEMOGLOBIN A1C: Mean Plasma Glucose: 151 mg/dL — ABNORMAL HIGH (ref <117)

## 2011-05-21 MED ORDER — PRAVASTATIN SODIUM 20 MG PO TABS
20.0000 mg | ORAL_TABLET | Freq: Every day | ORAL | Status: DC
  Administered 2011-05-21 – 2011-05-24 (×4): 20 mg via ORAL
  Filled 2011-05-21 (×5): qty 1

## 2011-05-21 MED ORDER — NON FORMULARY: 20.0000 mg/d | Freq: Every day | Status: DC

## 2011-05-21 MED ORDER — WARFARIN SODIUM 7.5 MG PO TABS
7.5000 mg | ORAL_TABLET | Freq: Once | ORAL | Status: AC
  Administered 2011-05-21: 7.5 mg via ORAL
  Filled 2011-05-21: qty 1

## 2011-05-21 MED ORDER — WARFARIN SODIUM 10 MG PO TABS
10.0000 mg | ORAL_TABLET | Freq: Once | ORAL | Status: DC
  Filled 2011-05-21: qty 1

## 2011-05-21 MED ORDER — WARFARIN - PHARMACIST DOSING INPATIENT: Freq: Every day | Status: DC

## 2011-05-21 MED ORDER — IOHEXOL 350 MG/ML SOLN
50.0000 mL | Freq: Once | INTRAVENOUS | Status: AC | PRN
  Administered 2011-05-21: 50 mL via INTRAVENOUS

## 2011-05-21 NOTE — Progress Notes (Signed)
Utilization Review Completed.Sincerity Cedar T3/01/2012   

## 2011-05-21 NOTE — Progress Notes (Signed)
*  PRELIMINARY RESULTS* Vascular Ultrasound Carotid Duplex (Doppler) has been completed.  Preliminary findings: Bilaterally no significant ICA stenosis with antegrade vertebral flow.  Farrel Demark RDMS 05/21/2011, 12:18 PM

## 2011-05-21 NOTE — Evaluation (Signed)
Physical Therapy Evaluation Patient Details Name: [name] MRN: [id number] DOB: 1935/02/16 Today's Date: 05/21/2011  Problem List:  Patient Active Problem List  Diagnoses  . Wears glasses  . Wears dentures  . Vomiting  . Dizziness  . Bradycardia, symptomatic  . DM (diabetes mellitus), IDDM  . Dehydration  . Hyponatremia  . History of breast cancer, right, T3, N1, mastectomy 02/27/2007.  Marland Kitchen LBBB (left bundle branch block)  . First degree AV block  . Fatigue, secondary to bradycardia  . Acute left-sided weakness  . Pacemaker  . Familial tremor    Past Medical History:  Past Medical History  Diagnosis Date  . Diabetes mellitus     Type 2  . Stroke     TIA per patient history form dated 02/15/10.  . Goiter   . Arthritis   . Dizziness - light-headed   . Family history of breast cancer     Did not specify what member of family per history form dated 02/15/10.  . Menopause   . Cancer     rt. breast ca  . Slow heart rate   . Hyperlipidemia   . Hypertension   . PONV (postoperative nausea and vomiting)    Past Surgical History:  Past Surgical History  Procedure Date  . Joint replacement     Knee - ask patient to clarify which knee or both.  . Breast surgery     lumpectomy  . Brain surgery     Stent placement  . Gallbladder surgery   . Mastectomy   . Thyroid surgery     due to Goiter  . Cholecystectomy   . Total knee arthroplasty 2008    left    PT Assessment/Plan/Recommendation PT Assessment Clinical Impression Statement: Patient is a 77 y.o. female s/p LUE and LLE weakness and admitted to r/o stroke vs. TIA.  Patient will benefit from acute physical therapy to improve strength and balance. PT Recommendation/Assessment: Patient will need skilled PT in the acute care venue PT Problem List: Decreased strength;Decreased activity tolerance;Decreased balance;Decreased mobility Barriers to Discharge: None PT Therapy Diagnosis : Difficulty walking;Abnormality of  gait;Generalized weakness PT Plan PT Frequency: Min 4X/week PT Treatment/Interventions: DME instruction;Gait training;Stair training;Functional mobility training;Therapeutic activities;Balance training;Neuromuscular re-education PT Recommendation Follow Up Recommendations: Home health PT Equipment Recommended: None recommended by PT PT Goals  Acute Rehab PT Goals PT Goal Formulation: With patient Time For Goal Achievement: 7 days Pt will go Supine/Side to Sit: Independently PT Goal: Supine/Side to Sit - Progress: Goal set today Pt will go Sit to Supine/Side: Independently PT Goal: Sit to Supine/Side - Progress: Goal set today Pt will go Sit to Stand: Independently;with upper extremity assist PT Goal: Sit to Stand - Progress: Goal set today Pt will go Stand to Sit: with supervision;with upper extremity assist PT Goal: Stand to Sit - Progress: Goal set today Pt will Ambulate: >150 feet;with supervision;with rolling walker PT Goal: Ambulate - Progress: Goal set today Additional Goals Additional Goal #1: Patient will increase Berg Balance Score by 5 points in order to demonstrate improved dynamic balance. PT Goal: Additional Goal #1 - Progress: Goal set today  PT Evaluation Precautions/Restrictions  Precautions Precautions: Fall Required Braces or Orthoses: No Restrictions Weight Bearing Restrictions: No Prior Functioning  Home Living Lives With: Alone (s.) Receives Help From: Friend(s) Type of Home: Mobile home Home Layout: One level Home Access: Ramped entrance Bathroom Toilet: Standard Home Adaptive Equipment: Grab bars around toilet;Straight cane;Walker - rolling Prior Function Level of Independence:  Independent with basic ADLs Driving: Yes Vocation: Retired Producer, television/film/video: Awake/alert Overall Cognitive Status: Appears within functional limits for tasks assessed Orientation Level: Oriented X4 Sensation/Coordination Sensation Light Touch:  Appears Intact Coordination Gross Motor Movements are Fluid and Coordinated: Yes Extremity Assessment RLE Assessment RLE Assessment: Exceptions to [facility] RLE Strength RLE Overall Strength: Deficits RLE Overall Strength Comments: 4/5 gross functional strength LLE Assessment LLE Assessment: Exceptions to WFL LLE Strength LLE Overall Strength: Deficits LLE Overall Strength Comments: 4/5 gross functional strength Mobility (including Balance) Bed Mobility Bed Mobility: Yes Supine to Sit: 6: Modified independent (Device/Increase time);HOB elevated (Comment degrees) Transfers Transfers: Yes Sit to Stand: 6: Modified independent (Device/Increase time);With upper extremity assist;From bed Stand to Sit: 5: Supervision;With upper extremity assist;To chair/3-in-1 Ambulation/Gait Ambulation/Gait: Yes Ambulation/Gait Assistance: Other (comment) (Min guard) Ambulation Distance (Feet): 200 Feet Assistive device: Rolling walker Gait Pattern: Decreased stride length;Decreased step length - left;Decreased hip/knee flexion - left (Pt veers to the left while walking.) Gait velocity: Decreased Stairs: No Wheelchair Mobility Wheelchair Mobility: No  Posture/Postural Control Posture/Postural Control: No significant limitations Balance Balance Assessed: Yes Static Sitting Balance Static Sitting - Balance Support: Bilateral upper extremity supported Static Sitting - Level of Assistance: 5: Stand by assistance Static Sitting - Comment/# of Minutes: 5 minutes Static Standing Balance Static Standing - Balance Support: During functional activity Static Standing - Level of Assistance: 5: Stand by assistance (Min guard) Static Standing - Comment/# of Minutes: 5 minutes Exercise    End of Session PT - End of Session Equipment Utilized During Treatment: Gait belt Activity Tolerance: Patient tolerated treatment well (Patient reported fatigue post treatment.) Patient left: in chair;with call bell in  reach;with family/visitor present General Behavior During Session: [facility] for tasks performed Cognition: [facility] for tasks performed  Ezzard Standing SPT 05/21/2011, 11:24 AM  Jake Shark, PT DPT [phone]

## 2011-05-21 NOTE — Progress Notes (Signed)
History: [name] is an 77 y.o. female with a history of previous stroke, right middle cerebral artery stent placement, hypertension, diabetes mellitus and recently diagnosed atrial fibrillation presenting with acute onset of left arm and leg weakness at 10 AM today. Patient has improved significantly since the onset of weakness. She has been on Coumadin for 10-11 days. INR today is 1.4. CT scan of her head showed right MCA stent with no acute intracranial abnormalities. NIH score was 2. Patient was not a candidate for IV thrombolytic therapy with TPA because of rapidly resolving deficits. Patient is also status post pacemaker placement for bradycardia.   LSN: [id number] today  tPA Given: No: Rapidly resolving deficits  MRankin: 0  Subjective: Patient feels she is doing better.  Attended by family member.  Aware her speech still slurred and slow. Less numbness in left extremities.  Objective: BP 121/75  Pulse 58  Temp(Src) 98.6 F (37 C) (Oral)  Resp 18  Ht 5\' 6"  (1.676 m)  Wt 99.338 kg (219 lb)  BMI 35.35 kg/m2  SpO2 95% Telemetry:  CBGs  Basename 05/21/11 0634 05/20/11 2144  GLUCAP 94 155*   Diet: CHO, thin  Activity: up with assistance  DVT Prophylaxis: on warfarin  Medications: Scheduled:   . calcium carbonate  1 tablet Oral Daily  . cholecalciferol  2,000 Units Oral Daily  . citalopram  10 mg Oral Daily  . vitamin B-12  2,500 mcg Oral Daily  . glipiZIDE  10 mg Oral BID AC  . letrozole  2.5 mg Oral Daily  . lisinopril  10 mg Oral QHS  . magnesium oxide  400 mg Oral Daily  . omega-3 acid ethyl esters  2 g Oral Daily  . propranolol  20 mg Oral BID  . warfarin  10 mg Oral Once  . warfarin  10 mg Oral ONCE-1800  . Warfarin - Pharmacist Dosing Inpatient   Does not apply q1800  . Warfarin - Pharmacist Dosing Inpatient   Does not apply q1800  . DISCONTD: Calcium   Oral Daily  . DISCONTD: fish oil-omega-3 fatty acids  2 g Oral Daily  . DISCONTD: Magnesium  2 tablet  Oral Daily  . DISCONTD: Vitamin B-12  1 tablet Sublingual Daily  . DISCONTD: Vitamin D  2,000 Units Oral Daily  . DISCONTD: warfarin  10 mg Oral Once   Neurologic Exam: Mental Status: Alert, oriented, thought content appropriate.  Speech fluent , but slowed in outout.  No evidence of aphasia. Mild dysarthria persists.  Able to follow 3 step commands without difficulty.  Attentive and conversant. Cranial Nerves: II- Visual fields grossly intact. III/IV/VI-Extraocular movements intact.  Pupils reactive bilaterally. V/VII-flattened L NLF VIII-hearing grossly intact IX/X-normal gag XI-bilateral shoulder shrug XII-midline tongue extension Motor: 5/5 bilaterally with normal tone and bulk, left grip slightly weaker than right.   Sensory: Light touch intact throughout, bilaterally.  Discrimination intact. Deep Tendon Reflexes: 1+ and symmetric throughout, except 0 @ achilles Plantars: Downgoing bilaterally Cerebellar: Minimal dysmetria of LUE/ LLE on finger to nose, heel to shin, respectively.  Orbits left hand.  Lab Results: Basic Metabolic Panel:  Lab 05/21/11 1610 05/20/11 1257 05/20/11 1247  NA 136 134* --  K 3.9 4.7 --  CL 101 99 --  CO2 26 -- 22  GLUCOSE 83 207* --  BUN 17 20 --  CREATININE 1.08 1.00 --  CALCIUM 9.4 -- 9.2  MG -- -- --  PHOS -- -- --   Liver Function Tests:  Lab  05/20/11 1247  AST 20  ALT 12  ALKPHOS 52  BILITOT 0.3  PROT 6.4  ALBUMIN 3.3*   CBC:  Lab 05/20/11 1257 05/20/11 1247  WBC -- 6.8  NEUTROABS -- 4.7  HGB 12.2 12.5  HCT 36.0 36.3  MCV -- 91.9  PLT -- 205   Cardiac Enzymes:  Lab 05/20/11 1247  CKTOTAL 85  CKMB 3.1  CKMBINDEX --  TROPONINI <0.30   CBG:  Lab 05/21/11 0634 05/20/11 2144  GLUCAP 94 155*   Fasting Lipid Panel:  Lab 05/21/11 0615  CHOL 226*  HDL 38*  LDLCALC 152*  TRIG 182*  CHOLHDL 5.9  LDLDIRECT --   Coagulation:  Lab 05/21/11 0615 05/20/11 1247  LABPROT 21.2* 17.4*  INR 1.80* 1.40   Study  Results:  05/21/2011  CT ANGIOGRAPHY NECK  Findings:  Poor contrast bolus leads to suboptimal opacification of the extracranial intracranial vasculature.  Tortuous but nonstenotic innominate and right common carotid artery.  Tortuous but nonstenotic left common carotid artery. Nonstenotic calcific plaque origin left subclavian.   Moderate stenosis right vertebral origin.  Nonstenotic calcific plaque left vertebral origin.  No significant plaque left carotid bifurcation.  Right internal carotid artery widely patent.  Moderate calcific plaque right external carotid origin.   Both vertebrals widely patent in their cervical segments.  No neck masses.  Airway midline.  Moderate spondylosis.  Mild scarring lung apices.  Pacemaker.   Review of the MIP images confirms the above findings.  IMPRESSION: No significant extracranial internal carotid vascular disease. Moderate stenosis right vertebral origin and right external carotid origin.    05/21/2011  CT ANGIOGRAPHY HEAD Findings:  Widely patent internal carotid arteries in the skull base and supraclinoid segments.  Right MCA stent does not show significant intrastent contrast accumulation.  Severe intimal hyperplasia not excluded.  Diminished intensity of the M2 and M3 vessel enhancement on the right as compared to the left.  No visible left MCA stenosis.  Anterior and posterior cerebral arteries are widely patent.  Basilar artery widely patent with vertebrals codominant.  Mild atrophy.  Chronic microvascular ischemic change.  No acute large vessel infarct.  Unremarkable orbits, sinuses, and mastoids. Calvarium intact.   Review of the MIP images confirms the above findings.  IMPRESSION: Concern raised for significant right M1 MCA intrastent stenosis, likely intimal hyperplasia.  Recommend formal catheter angiogram.  [name], M.D.   05/20/2011  CXR Findings: Cardiomegaly.  Left chest wall battery pack with dual leads, tips projecting over the right atrium and  ventricle. Prominent mediastinal contours, similar to prior.  Mild left lung base opacity and mild left hemidiaphragm elevation.  No pleural effusion or pneumothorax.  No acute osseous abnormality.  IMPRESSION: Stable prominent cardiomediastinal contours.  Mild left lung base scarring versus atelectasis.   Waneta Martins, M.D.   05/20/2011   CT HEAD WITHOUT CONTRAST   Findings: Stent within right middle cerebral artery (M1 segment) unchanged compared to the prior examination.  Mild cerebral and cerebellar atrophy with mild chronic microvascular ischemic changes in the periventricular white matter of the cerebral hemispheres bilaterally.  No definite acute intracranial abnormality. Specifically, no definite signs of acute/subacute ischemia, no focal mass, mass effect, hydrocephalus or abnormal intra or extra- axial fluid collections.  Visualized paranasal sinuses and mastoids are well pneumatized.  No displaced skull fractures are noted.  IMPRESSION: 1.  No acute intracranial abnormalities. 2.  Mild cerebral and cerebellar atrophy with mild chronic microvascular ischemic changes in the white matter, as  above.  Florencia Reasons, M.D.   2D ECHO and Carotid Dopplers: pending  Therapies: pending  Assessment/Plan: 77 y.o. female with resolving acute weakness of the left arm and leg, and to a lesser extent left lower face. Rt hemispheric  TIA is likely. However small subcortical right MCA territory stroke cannot be ruled out. Has chronic Afib and   2. Atrial fibrillation on subtherapeutic dose of warfarin (INR 1.4 on admit) 3. Status post right MCA stent placement 2011  4. History of previous right CVA.  5. Hyperlipidemia- not at goal <80, LDL 152 6. Diabetes- A1C pending 7. Hypertension- controlled 8. PTVPM- bradycardia 9. H/O breast CA s/p mastectomy 2008.  On Femara.  Stroke Risk Factors - atrial fibrillation, diabetes mellitus and hypertension   Plan:  1. HgbA1c 2. PT consult, OT consult,  Speech consult  3. Echocardiogram / Carotid dopplers pending 4. Prophylactic therapy-Anticoagulation: Coumadin ? Change to xeralto? 5. Risk factor modification  6. Telemetry monitoring 7. Is Femara increasing her stroke risk? 8. Add pravachol for lipids- intol. to other statins- cramping, zetia caused dizziness. 9..check TCD if elevated rt mca velocities will consider catheter angio  Patient has been seen and examined by Dr. Pearlean Brownie who agrees with above plan.   LOS: 1 day   Marya Fossa PA-C [facility] [phone] 05/21/2011  10:11 AM

## 2011-05-21 NOTE — Evaluation (Signed)
Speech Language Pathology Evaluation Patient Details Name: [name] MRN: [id number] DOB: 1934-11-09 Today's Date: 05/21/2011  Problem List:  Patient Active Problem List  Diagnoses  . Wears glasses  . Wears dentures  . Vomiting  . Dizziness  . Bradycardia, symptomatic  . DM (diabetes mellitus), IDDM  . Dehydration  . Hyponatremia  . History of breast cancer, right, T3, N1, mastectomy 02/27/2007.  Marland Kitchen LBBB (left bundle branch block)  . First degree AV block  . Fatigue, secondary to bradycardia  . Acute left-sided weakness  . Pacemaker  . Familial tremor   Past Medical History:  Past Medical History  Diagnosis Date  . Diabetes mellitus     Type 2  . Stroke     TIA per patient history form dated 02/15/10.  . Goiter   . Arthritis   . Dizziness - light-headed   . Family history of breast cancer     Did not specify what member of family per history form dated 02/15/10.  . Menopause   . Cancer     rt. breast ca  . Slow heart rate   . Hyperlipidemia   . Hypertension   . PONV (postoperative nausea and vomiting)    Past Surgical History:  Past Surgical History  Procedure Date  . Joint replacement     Knee - ask patient to clarify which knee or both.  . Breast surgery     lumpectomy  . Brain surgery     Stent placement  . Gallbladder surgery   . Mastectomy   . Thyroid surgery     due to Goiter  . Cholecystectomy   . Total knee arthroplasty 2008    left    SLP Assessment/Plan/Recommendation Clinical Impression:  77 yo female with PMH significant for CVA in past with longstanding dizziness/vertigo, balance/gait difficulties since then, recently diagnosed afib, DM, HTN, hyperlipidemia, h/o brady and s/p pacemaker(03/27/11) admitted with left-sided weakness.  CCT:  No acute intracranial abnormalities. 2.  Mild cerebral and cerebellar atrophy with mild chronic microvascular ischemic changes in the white matter.  Pt presents with mild dysarthria of speech, fully  intelligible but staccato-like, deliberate speech production which is different than baseline.  Pt would like to pursue speech therapy.    SLP Recommendation/Assessment: Patient will need skilled Speech Language Pathology Services in the acute care venue to address identified deficits Plan Speech Therapy Frequency: min 2x/week Duration: 1 week Treatment/Interventions: Functional tasks;SLP instruction and feedback;Compensatory strategies Potential to Achieve Goals: Good Individuals Consulted Consulted and Agree with Results and Recommendations: Patient  SLP Goals  SLP Goals Potential to Achieve Goals: Good Progress/Goals/Alternative treatment plan discussed with pt/caregiver and they: Agree SLP Goal #1: Pt will utilize strategies to improve fluidity of speech production with min assist.  Marchelle Folks L. Samson Frederic, [place] CCC/SLP Pager [phone]   Blenda Mounts Laurice 05/21/2011, 10:44 AM

## 2011-05-21 NOTE — Progress Notes (Signed)
ANTICOAGULATION CONSULT NOTE - Initial Consult  Pharmacy Consult for Coumadin Indication: atrial fibrillation and stroke  Allergies  Allergen Reactions  . Oxycontin Nausea And Vomiting  . Crestor (Rosuvastatin Calcium) Other (See Comments)    dizziness  . Statins     Cramping in legs  . Zetia (Ezetimibe) Other (See Comments)    dizziness    Patient Measurements: Height: 5\' 6"  (167.6 cm) Weight: 219 lb (99.338 kg) IBW/kg (Calculated) : 59.3   Vital Signs: Temp: 98.6 F (37 C) (03/11 0548) Temp src: Oral (03/11 0548) BP: 121/75 mmHg (03/11 0548) Pulse Rate: 58  (03/11 0548)  Labs:  Basename 05/21/11 0615 05/20/11 1257 05/20/11 1247  HGB -- 12.2 12.5  HCT -- 36.0 36.3  PLT -- -- 205  APTT -- -- 47*  LABPROT 21.2* -- 17.4*  INR 1.80* -- 1.40  HEPARINUNFRC -- -- --  CREATININE 1.08 1.00 0.88  CKTOTAL -- -- 85  CKMB -- -- 3.1  TROPONINI -- -- <0.30   Estimated Creatinine Clearance: 54.3 ml/min (by C-G formula based on Cr of 1.08).  Medical History: Past Medical History  Diagnosis Date  . Diabetes mellitus     Type 2  . Stroke     TIA per patient history form dated 02/15/10.  . Goiter   . Arthritis   . Dizziness - light-headed   . Family history of breast cancer     Did not specify what member of family per history form dated 02/15/10.  . Menopause   . Cancer     rt. breast ca  . Slow heart rate   . Hyperlipidemia   . Hypertension   . PONV (postoperative nausea and vomiting)     Medications:  Scheduled:     . calcium carbonate  1 tablet Oral Daily  . cholecalciferol  2,000 Units Oral Daily  . citalopram  10 mg Oral Daily  . vitamin B-12  2,500 mcg Oral Daily  . glipiZIDE  10 mg Oral BID AC  . letrozole  2.5 mg Oral Daily  . lisinopril  10 mg Oral QHS  . magnesium oxide  400 mg Oral Daily  . omega-3 acid ethyl esters  2 g Oral Daily  . propranolol  20 mg Oral BID  . warfarin  10 mg Oral Once  . warfarin  10 mg Oral ONCE-1800  . Warfarin -  Pharmacist Dosing Inpatient   Does not apply q1800  . Warfarin - Pharmacist Dosing Inpatient   Does not apply q1800  . DISCONTD: Calcium   Oral Daily  . DISCONTD: fish oil-omega-3 fatty acids  2 g Oral Daily  . DISCONTD: Magnesium  2 tablet Oral Daily  . DISCONTD: Vitamin B-12  1 tablet Sublingual Daily  . DISCONTD: Vitamin D  2,000 Units Oral Daily  . DISCONTD: warfarin  10 mg Oral Once    Assessment: Pt was started on warfarin approximately 10 days ago for new onset Afib.  She is currently taking warfarin 5mg  on MWF and 7.5mg  on TTSS.  She was admitted today with CVA.  Her INR is subtherapeutic.  No bleeding noted.   Goal of Therapy:  INR 2-3   Plan:  1) Coumadin 7.5mg  PO x1 2) Daily protimes   Ulyses Southward [place] 05/21/2011,10:36 AM

## 2011-05-21 NOTE — Progress Notes (Signed)
  Echocardiogram 2D Echocardiogram has been performed.  Mercy Moore 05/21/2011, 12:15 PM

## 2011-05-21 NOTE — Progress Notes (Signed)
Subjective: Pt states she feels better this am, denies any new c/o Objective: Vital signs in last 24 hours: Temp:  [97.4 F (36.3 C)-98.7 F (37.1 C)] 98.7 F (37.1 C) (03/11 1800) Pulse Rate:  [58-68] 62  (03/11 1800) Resp:  [18-19] 19  (03/11 1800) BP: (112-142)/(55-82) 142/82 mmHg (03/11 1800) SpO2:  [92 %-97 %] 95 % (03/11 1800) Last BM Date: 05/18/11 Intake/Output from previous day:   Intake/Output this shift:      General Appearance:    Alert, cooperative, no distress, appears stated age  Lungs:     Clear to auscultation bilaterally, respirations unlabored   Heart:    Regular, S1 and S2 normal, no murmur, rub   or gallop  Abdomen:     Soft, non-tender, bowel sounds active all four quadrants,    no masses, no organomegaly  Extremities:   Extremities normal, atraumatic, no cyanosis or edema  Neurologic:   CNII-XII intact, nonfocal     Weight change:  No intake or output data in the 24 hours ending 05/21/11 2027  Lab Results:   [facility] 05/21/11 0615 05/20/11 1257 05/20/11 1247  NA 136 134* --  K 3.9 4.7 --  CL 101 99 --  CO2 26 -- 22  GLUCOSE 83 207* --  BUN 17 20 --  CREATININE 1.08 1.00 --  CALCIUM 9.4 -- 9.2    Basename 05/20/11 1257 05/20/11 1247  WBC -- 6.8  HGB 12.2 12.5  HCT 36.0 36.3  PLT -- 205  MCV -- 91.9   PT/INR  Basename 05/21/11 0615 05/20/11 1247  LABPROT 21.2* 17.4*  INR 1.80* 1.40   ABG No results found for this basename: PHART:2,PCO2:2,PO2:2,HCO3:2 in the last 72 hours  Micro Results: No results found for this or any previous visit (from the past 240 hour(s)). Studies/Results: Ct Angio Head W/cm &/or Wo Cm  05/21/2011  *RADIOLOGY REPORT*  Clinical Data:  Left-sided weakness and slurred speech.  CT ANGIOGRAPHY HEAD AND NECK  Technique:  Multidetector CT imaging of the head and neck was performed using the standard protocol during bolus administration of intravenous contrast.  Multiplanar CT image reconstructions including MIPs  were obtained to evaluate the vascular anatomy. Carotid stenosis measurements (when applicable) are obtained utilizing NASCET criteria, using the distal internal carotid diameter as the denominator.  Contrast: 50mL OMNIPAQUE IOHEXOL 350 MG/ML IV SOLN,  Comparison:  Cerebral angiography 09/27/2009.  CTA NECK  Findings:  Poor contrast bolus leads to suboptimal opacification of the extracranial intracranial vasculature.  Tortuous but nonstenotic innominate and right common carotid artery.  Tortuous but nonstenotic left common carotid artery. Nonstenotic calcific plaque origin left subclavian.   Moderate stenosis right vertebral origin.  Nonstenotic calcific plaque left vertebral origin.  No significant plaque left carotid bifurcation.  Right internal carotid artery widely patent.  Moderate calcific plaque right external carotid origin.   Both vertebrals widely patent in their cervical segments.  No neck masses.  Airway midline.  Moderate spondylosis.  Mild scarring lung apices.  Pacemaker.   Review of the MIP images confirms the above findings.  IMPRESSION: No significant extracranial internal carotid vascular disease. Moderate stenosis right vertebral origin and right external carotid origin.  CTA HEAD  Findings:  Widely patent internal carotid arteries in the skull base and supraclinoid segments.  Right MCA stent does not show significant intrastent contrast accumulation.  Severe intimal hyperplasia not excluded.  Diminished intensity of the M2 and M3 vessel enhancement on the right as compared to the left.  No visible left MCA stenosis.  Anterior and posterior cerebral arteries are widely patent.  Basilar artery widely patent with vertebrals codominant.  Mild atrophy.  Chronic microvascular ischemic change.  No acute large vessel infarct.  Unremarkable orbits, sinuses, and mastoids. Calvarium intact.   Review of the MIP images confirms the above findings.  IMPRESSION: Concern raised for significant right M1 MCA  intrastent stenosis, likely intimal hyperplasia.  Recommend formal catheter angiogram.  Original Report Authenticated By: [name], M.D.   Dg Chest 2 View  05/20/2011  *RADIOLOGY REPORT*  Clinical Data: Hypertension, diabetes  CHEST - 2 VIEW  Comparison: 05/20/2011  Findings: Cardiomegaly.  Left chest wall battery pack with dual leads, tips projecting over the right atrium and ventricle. Prominent mediastinal contours, similar to prior.  Mild left lung base opacity and mild left hemidiaphragm elevation.  No pleural effusion or pneumothorax.  No acute osseous abnormality.  IMPRESSION: Stable prominent cardiomediastinal contours.  Mild left lung base scarring versus atelectasis.  Original Report Authenticated By: Waneta Martins, M.D.   Ct Head Wo Contrast  05/20/2011  *RADIOLOGY REPORT*  Clinical Data: Left arm weakness and heaviness today.  Code stroke.  CT HEAD WITHOUT CONTRAST  Technique:  Contiguous axial images were obtained from the base of the skull through the vertex without contrast.  Comparison: Head CT 03/01/2011.  Findings: Stent within right middle cerebral artery (M1 segment) unchanged compared to the prior examination.  Mild cerebral and cerebellar atrophy with mild chronic microvascular ischemic changes in the periventricular white matter of the cerebral hemispheres bilaterally.  No definite acute intracranial abnormality. Specifically, no definite signs of acute/subacute ischemia, no focal mass, mass effect, hydrocephalus or abnormal intra or extra- axial fluid collections.  Visualized paranasal sinuses and mastoids are well pneumatized.  No displaced skull fractures are noted.  IMPRESSION: 1.  No acute intracranial abnormalities. 2.  Mild cerebral and cerebellar atrophy with mild chronic microvascular ischemic changes in the white matter, as above.  These results were called by telephone on 05/20/2011  at  01:10 p.m. to  Dr. Fonnie Jarvis, who verbally acknowledged these results.  Original  Report Authenticated By: Florencia Reasons, M.D.   Ct Angio Neck W/cm &/or Wo/cm  05/21/2011  *RADIOLOGY REPORT*  Clinical Data:  Left-sided weakness and slurred speech.  CT ANGIOGRAPHY HEAD AND NECK  Technique:  Multidetector CT imaging of the head and neck was performed using the standard protocol during bolus administration of intravenous contrast.  Multiplanar CT image reconstructions including MIPs were obtained to evaluate the vascular anatomy. Carotid stenosis measurements (when applicable) are obtained utilizing NASCET criteria, using the distal internal carotid diameter as the denominator.  Contrast: 50mL OMNIPAQUE IOHEXOL 350 MG/ML IV SOLN,  Comparison:  Cerebral angiography 09/27/2009.  CTA NECK  Findings:  Poor contrast bolus leads to suboptimal opacification of the extracranial intracranial vasculature.  Tortuous but nonstenotic innominate and right common carotid artery.  Tortuous but nonstenotic left common carotid artery. Nonstenotic calcific plaque origin left subclavian.   Moderate stenosis right vertebral origin.  Nonstenotic calcific plaque left vertebral origin.  No significant plaque left carotid bifurcation.  Right internal carotid artery widely patent.  Moderate calcific plaque right external carotid origin.   Both vertebrals widely patent in their cervical segments.  No neck masses.  Airway midline.  Moderate spondylosis.  Mild scarring lung apices.  Pacemaker.   Review of the MIP images confirms the above findings.  IMPRESSION: No significant extracranial internal carotid vascular disease. Moderate stenosis right vertebral  origin and right external carotid origin.  CTA HEAD  Findings:  Widely patent internal carotid arteries in the skull base and supraclinoid segments.  Right MCA stent does not show significant intrastent contrast accumulation.  Severe intimal hyperplasia not excluded.  Diminished intensity of the M2 and M3 vessel enhancement on the right as compared to the left.  No  visible left MCA stenosis.  Anterior and posterior cerebral arteries are widely patent.  Basilar artery widely patent with vertebrals codominant.  Mild atrophy.  Chronic microvascular ischemic change.  No acute large vessel infarct.  Unremarkable orbits, sinuses, and mastoids. Calvarium intact.   Review of the MIP images confirms the above findings.  IMPRESSION: Concern raised for significant right M1 MCA intrastent stenosis, likely intimal hyperplasia.  Recommend formal catheter angiogram.  Original Report Authenticated By: [name], M.D.   Dg Chest Portable 1 View  05/20/2011  *RADIOLOGY REPORT*  Clinical Data: Left-sided weakness.  PORTABLE CHEST - 1 VIEW  Comparison: 03/28/2011.  Findings: The permanent pacer wires are stable.  The heart is enlarged but unchanged.  Low lung volumes with vascular crowding and bibasilar atelectasis but no infiltrates or effusions.  The bony thorax is intact.  IMPRESSION:  1.  Stable cardiac enlargement. 2.  Low lung volumes with vascular crowding and basilar atelectasis.  Original Report Authenticated By: P. Loralie Champagne, M.D.   Medications: Scheduled Meds:   . calcium carbonate  1 tablet Oral Daily  . cholecalciferol  2,000 Units Oral Daily  . citalopram  10 mg Oral Daily  . vitamin B-12  2,500 mcg Oral Daily  . glipiZIDE  10 mg Oral BID AC  . letrozole  2.5 mg Oral Daily  . lisinopril  10 mg Oral QHS  . magnesium oxide  400 mg Oral Daily  . omega-3 acid ethyl esters  2 g Oral Daily  . pravastatin  20 mg Oral q1800  . propranolol  20 mg Oral BID  . warfarin  10 mg Oral Once  . warfarin  7.5 mg Oral ONCE-1800  . Warfarin - Pharmacist Dosing Inpatient   Does not apply q1800  . DISCONTD: NON FORMULARY 20 mg/day  20 mg/day Oral QHS  . DISCONTD: warfarin  10 mg Oral Once  . DISCONTD: warfarin  10 mg Oral ONCE-1800  . DISCONTD: Warfarin - Pharmacist Dosing Inpatient   Does not apply q1800   Continuous Infusions:   . DISCONTD: sodium chloride 75  mL/hr at 05/20/11 2100   PRN Meds:.iohexol, ondansetron (ZOFRAN) IV, senna-docusate Assessment/Plan: .Acute left-sided weakness - TIA vs CVA  - CTA of head and neck with concern raised about intrastent stenosis and pt discussed with Dr Corliss Skains recommeding cerebral angiogram now vs coumadin+antiplatelet agent now and then angiogram only if pt agan symptomatic after> will await neuro input, pt was was seen by Dr Pearlean Brownie in the past and she wants his input as well before deciding  - PT OT consulted  - await Neuro attending input as above .DM (diabetes mellitus)  -continue glipizide, and SSI .Hyponatremia  - resolved with hydration and off HCTZ .Pacemaker .Familial tremor  - continue propanolol  H/o Bradycardia, symptomatic  -s/p pacemaker  .Afib  -rate controlled, continue coumadin  .History of breast cancer, right, T3, N1, mastectomy 02/27/2007.       LOS: 1 day   Rifky Lapre C 05/21/2011, 8:27 PM

## 2011-05-21 NOTE — Progress Notes (Signed)
Inpatient Diabetes Program Recommendations  AACE/ADA: New Consensus Statement on Inpatient Glycemic Control (2009)  Target Ranges:  Prepandial:   less than 140 mg/dL      Peak postprandial:   less than 180 mg/dL (1-2 hours)      Critically ill patients:  140 - 180 mg/dL   Reason for Visit: Hyperglycemia  Results for [name], [name] (MRN [id number]) as of 05/21/2011 09:39  Ref. Range 05/20/2011 21:44 05/21/2011 06:34  Glucose-Capillary Latest Range: 70-99 mg/dL 782 (H) 94  Results for [name], [name] (MRN [id number]) as of 05/21/2011 09:39  Ref. Range 05/20/2011 12:57 05/21/2011 06:15  Glucose Latest Range: 70-99 mg/dL 578 (H) 83  Inpatient Diabetes Program Recommendations  AACE/ADA: New Consensus Statement on Inpatient Glycemic Control (2009)  Target Ranges:  Prepandial:   less than 140 mg/dL      Peak postprandial:   less than 180 mg/dL (1-2 hours)      Critically ill patients:  140 - 180 mg/dL   Reason for Visit: Hyperglycemia  Results for [name], [name] (MRN [id number]) as of 05/21/2011 09:39  Ref. Range 05/21/2011 06:34  Glucose-Capillary Latest Range: 70-99 mg/dL 94  Results for [name], [name] (MRN [id number]) as of 05/21/2011 09:39  Ref. Range 05/20/2011 12:57 05/21/2011 06:15  Glucose Latest Range: 70-99 mg/dL 272 (H) 83    Inpatient Diabetes Program Recommendations Correction (SSI): Novolog sensitive tidwc HgbA1C: Check HgbA1C to assess glycemic control prior to hospitalization  Note:  Will follow.

## 2011-05-22 ENCOUNTER — Inpatient Hospital Stay (HOSPITAL_COMMUNITY): Payer: Medicare Other

## 2011-05-22 LAB — GLUCOSE, CAPILLARY: Glucose-Capillary: 112 mg/dL — ABNORMAL HIGH (ref 70–99)

## 2011-05-22 LAB — BASIC METABOLIC PANEL
BUN: 20 mg/dL (ref 6–23)
CO2: 28 mEq/L (ref 19–32)
Chloride: 96 mEq/L (ref 96–112)
GFR calc Af Amer: 58 mL/min — ABNORMAL LOW (ref >90)
Potassium: 4.4 mEq/L (ref 3.5–5.1)

## 2011-05-22 LAB — PROTIME-INR: INR: 2.02 — ABNORMAL HIGH (ref 0.00–1.49)

## 2011-05-22 MED ORDER — ASPIRIN EC 81 MG PO TBEC
81.0000 mg | DELAYED_RELEASE_TABLET | Freq: Every day | ORAL | Status: DC
  Administered 2011-05-22 – 2011-05-25 (×3): 81 mg via ORAL
  Filled 2011-05-22 (×5): qty 1

## 2011-05-22 MED ORDER — CLOPIDOGREL BISULFATE 75 MG PO TABS
75.0000 mg | ORAL_TABLET | Freq: Every day | ORAL | Status: DC
  Administered 2011-05-23 – 2011-05-25 (×3): 75 mg via ORAL
  Filled 2011-05-22 (×4): qty 1

## 2011-05-22 MED ORDER — SODIUM CHLORIDE 0.9 % IV SOLN
INTRAVENOUS | Status: DC
  Administered 2011-05-24 – 2011-05-25 (×2): via INTRAVENOUS

## 2011-05-22 MED ORDER — WARFARIN SODIUM 7.5 MG PO TABS
7.5000 mg | ORAL_TABLET | Freq: Once | ORAL | Status: DC
  Filled 2011-05-22: qty 1

## 2011-05-22 NOTE — Progress Notes (Signed)
ANTICOAGULATION CONSULT NOTE - Initial Consult  Pharmacy Consult for Coumadin Indication: atrial fibrillation and stroke  Allergies  Allergen Reactions  . Oxycontin Nausea And Vomiting  . Crestor (Rosuvastatin Calcium) Other (See Comments)    dizziness  . Statins     Cramping in legs  . Zetia (Ezetimibe) Other (See Comments)    dizziness    Patient Measurements: Height: 5\' 6"  (167.6 cm) Weight: 219 lb (99.338 kg) IBW/kg (Calculated) : 59.3   Vital Signs: Temp: 97.4 F (36.3 C) (03/12 0600) Temp src: Oral (03/12 0600) BP: 102/65 mmHg (03/12 0600) Pulse Rate: 65  (03/12 0600)  Labs:  Basename 05/22/11 0608 05/22/11 05/21/11 0615 05/20/11 1257 05/20/11 1247  HGB -- -- -- 12.2 12.5  HCT -- -- -- 36.0 36.3  PLT -- -- -- -- 205  APTT -- -- -- -- 47*  LABPROT 23.2* -- 21.2* -- 17.4*  INR 2.02* -- 1.80* -- 1.40  HEPARINUNFRC -- -- -- -- --  CREATININE -- 1.06 1.08 1.00 --  CKTOTAL -- -- -- -- 85  CKMB -- -- -- -- 3.1  TROPONINI -- -- -- -- <0.30   Estimated Creatinine Clearance: 55.4 ml/min (by C-G formula based on Cr of 1.06).  Medical History: Past Medical History  Diagnosis Date  . Diabetes mellitus     Type 2  . Stroke     TIA per patient history form dated 02/15/10.  . Goiter   . Arthritis   . Dizziness - light-headed   . Family history of breast cancer     Did not specify what member of family per history form dated 02/15/10.  . Menopause   . Cancer     rt. breast ca  . Slow heart rate   . Hyperlipidemia   . Hypertension   . PONV (postoperative nausea and vomiting)     Medications:  Scheduled:     . calcium carbonate  1 tablet Oral Daily  . cholecalciferol  2,000 Units Oral Daily  . citalopram  10 mg Oral Daily  . vitamin B-12  2,500 mcg Oral Daily  . glipiZIDE  10 mg Oral BID AC  . letrozole  2.5 mg Oral Daily  . lisinopril  10 mg Oral QHS  . magnesium oxide  400 mg Oral Daily  . omega-3 acid ethyl esters  2 g Oral Daily  . pravastatin  20  mg Oral q1800  . propranolol  20 mg Oral BID  . warfarin  7.5 mg Oral ONCE-1800  . Warfarin - Pharmacist Dosing Inpatient   Does not apply q1800  . DISCONTD: NON FORMULARY 20 mg/day  20 mg/day Oral QHS  . DISCONTD: warfarin  10 mg Oral ONCE-1800  . DISCONTD: Warfarin - Pharmacist Dosing Inpatient   Does not apply q1800    Assessment: Pt was started on warfarin approximately 10 days ago for new onset Afib.  She is currently taking warfarin 5mg  on MWF and 7.5mg  on TTSS.  She was admitted today with CVA. INR is therapeutic today at 2. No bleeding.  Goal of Therapy:  INR 2-3   Plan:  1) Coumadin 7.5mg  PO x1 2) Daily protimes   Ulyses Southward [place] 05/22/2011,8:36 AM

## 2011-05-22 NOTE — Progress Notes (Signed)
Physical Therapy Treatment Patient Details Name: [name] MRN: [id number] DOB: 1935/02/27 Today's Date: 05/22/2011  PT Assessment/Plan  PT - Assessment/Plan Comments on Treatment Session: Pt able to complete Berg and scored 37/56 giving pt a score of high fall risk.  Pt able to increase ambulation distance but continues to needs cues for safety. PT Plan: Discharge plan remains appropriate;Frequency remains appropriate PT Frequency: Min 4X/week Follow Up Recommendations: Home health PT Equipment Recommended: None recommended by PT PT Goals  Acute Rehab PT Goals PT Goal Formulation: With patient Time For Goal Achievement: 7 days Pt will go Supine/Side to Sit: Independently PT Goal: Supine/Side to Sit - Progress: Progressing toward goal Pt will go Sit to Stand: Independently;with upper extremity assist PT Goal: Sit to Stand - Progress: Progressing toward goal Pt will go Stand to Sit: with supervision;with upper extremity assist PT Goal: Stand to Sit - Progress: Progressing toward goal Pt will Ambulate: >150 feet;with supervision;with rolling walker PT Goal: Ambulate - Progress: Progressing toward goal Additional Goals Additional Goal #1: Patient will increase Berg Balance Score by 5 points in order to demonstrate improved dynamic balance. PT Goal: Additional Goal #1 - Progress: Progressing toward goal  PT Treatment Precautions/Restrictions  Precautions Precautions: Fall Required Braces or Orthoses: No Restrictions Weight Bearing Restrictions: No Mobility (including Balance) Bed Mobility Bed Mobility: No Transfers Transfers: Yes Sit to Stand: 5: Supervision;From chair/3-in-1;From toilet Sit to Stand Details (indicate cue type and reason): Supervision for safety.  Pt stood without therapist and RW in front.  Supervision for safety with cues for proper RW placement prior to standing. Stand to Sit: 5: Supervision Stand to Sit Details: Supervision for safety with max cues for  hand placement. Ambulation/Gait Ambulation/Gait: Yes Ambulation/Gait Assistance: 5: Supervision Ambulation/Gait Assistance Details (indicate cue type and reason): Supervision for safety with cues for RW placement. Ambulation Distance (Feet): 230 Feet Assistive device: Rolling walker Gait Pattern: Decreased stride length;Decreased step length - left;Decreased hip/knee flexion - left Gait velocity: Decreased Stairs: No Wheelchair Mobility Wheelchair Mobility: No  Posture/Postural Control Posture/Postural Control: No significant limitations Balance Balance Assessed: Yes Static Sitting Balance Static Sitting - Balance Support: Bilateral upper extremity supported Static Sitting - Level of Assistance: 6: Modified independent (Device/Increase time) Berg Balance Test Sit to Stand: Able to stand without using hands and stabilize independently Standing Unsupported: Able to stand safely 2 minutes Sitting with Back Unsupported but Feet Supported on Floor or Stool: Able to sit safely and securely 2 minutes Stand to Sit: Controls descent by using hands Transfers: Able to transfer safely, minor use of hands Standing Unsupported with Eyes Closed: Able to stand 10 seconds safely Standing Ubsupported with Feet Together: Able to place feet together independently and stand for 1 minute with supervision From Standing, Reach Forward with Outstretched Arm: Loses balance while trying/requires external support From Standing Position, Pick up Object from Floor: Able to pick up shoe, needs supervision From Standing Position, Turn to Look Behind Over each Shoulder: Looks behind from both sides and weight shifts well Turn 360 Degrees: Able to turn 360 degrees safely but slowly Standing Unsupported, Alternately Place Feet on Step/Stool: Needs assistance to keep from falling or unable to try Standing Unsupported, One Foot in Front: Needs help to step but can hold 15 seconds Standing on One Leg: Tries to lift  leg/unable to hold 3 seconds but remains standing independently Total Score: 37  Exercise    End of Session PT - End of Session Equipment Utilized During Treatment: Gait belt  Activity Tolerance: Patient tolerated treatment well Patient left: in chair;with call bell in reach;with family/visitor present Nurse Communication: Mobility status for transfers;Mobility status for ambulation General Behavior During Session: [facility] for tasks performed Cognition: [facility] for tasks performed  Claryce Friel 05/22/2011, 12:16 PM  Jake Shark, PT DPT [phone]

## 2011-05-22 NOTE — Interval H&P Note (Cosign Needed)
History and Physical Interval Note:  05/22/2011 1:45 PM  Shaneta Samul Dada  Is tentatively schedule for cerebral arteriogram to further evaluate for right MCA intrastent stenosis once INR <1.7.  The various methods of treatment have been discussed with the patient and family. After consideration of risks, benefits and other options for treatment, the patient has consented to the above procedure .  The patients' history has been reviewed, patient examined, no change in status, stable for the above procedure once INR parameters met.  I have reviewed the patients' chart and labs. PT currently 23 with INR 2.0. Will need to discontinue coumadin and place pt on aspirin/plavix in interim. Questions were answered to the patient's satisfaction.   Past Medical History  Diagnosis Date  . Diabetes mellitus     Type 2  . Stroke     TIA per patient history form dated 02/15/10.  . Goiter   . Arthritis   . Dizziness - light-headed   . Family history of breast cancer     Did not specify what member of family per history form dated 02/15/10.  . Menopause   . Cancer     rt. breast ca  . Slow heart rate   . Hyperlipidemia   . Hypertension   . PONV (postoperative nausea and vomiting)    Past Surgical History  Procedure Date  . Joint replacement     Knee - ask patient to clarify which knee or both.  . Breast surgery     lumpectomy  . Brain surgery     Stent placement  . Gallbladder surgery   . Mastectomy   . Thyroid surgery     due to Goiter  . Cholecystectomy   . Total knee arthroplasty 2008    left   Ct Angio Head W/cm &/or Wo Cm  05/21/2011  *RADIOLOGY REPORT*  Clinical Data:  Left-sided weakness and slurred speech.  CT ANGIOGRAPHY HEAD AND NECK  Technique:  Multidetector CT imaging of the head and neck was performed using the standard protocol during bolus administration of intravenous contrast.  Multiplanar CT image reconstructions including MIPs were obtained to evaluate the vascular anatomy.  Carotid stenosis measurements (when applicable) are obtained utilizing NASCET criteria, using the distal internal carotid diameter as the denominator.  Contrast: 50mL OMNIPAQUE IOHEXOL 350 MG/ML IV SOLN,  Comparison:  Cerebral angiography 09/27/2009.  CTA NECK  Findings:  Poor contrast bolus leads to suboptimal opacification of the extracranial intracranial vasculature.  Tortuous but nonstenotic innominate and right common carotid artery.  Tortuous but nonstenotic left common carotid artery. Nonstenotic calcific plaque origin left subclavian.   Moderate stenosis right vertebral origin.  Nonstenotic calcific plaque left vertebral origin.  No significant plaque left carotid bifurcation.  Right internal carotid artery widely patent.  Moderate calcific plaque right external carotid origin.   Both vertebrals widely patent in their cervical segments.  No neck masses.  Airway midline.  Moderate spondylosis.  Mild scarring lung apices.  Pacemaker.   Review of the MIP images confirms the above findings.  IMPRESSION: No significant extracranial internal carotid vascular disease. Moderate stenosis right vertebral origin and right external carotid origin.  CTA HEAD  Findings:  Widely patent internal carotid arteries in the skull base and supraclinoid segments.  Right MCA stent does not show significant intrastent contrast accumulation.  Severe intimal hyperplasia not excluded.  Diminished intensity of the M2 and M3 vessel enhancement on the right as compared to the left.  No visible left MCA stenosis.  Anterior and posterior cerebral arteries are widely patent.  Basilar artery widely patent with vertebrals codominant.  Mild atrophy.  Chronic microvascular ischemic change.  No acute large vessel infarct.  Unremarkable orbits, sinuses, and mastoids. Calvarium intact.   Review of the MIP images confirms the above findings.  IMPRESSION: Concern raised for significant right M1 MCA intrastent stenosis, likely intimal hyperplasia.   Recommend formal catheter angiogram.  Original Report Authenticated By: [name], M.D.   Dg Chest 2 View  05/20/2011  *RADIOLOGY REPORT*  Clinical Data: Hypertension, diabetes  CHEST - 2 VIEW  Comparison: 05/20/2011  Findings: Cardiomegaly.  Left chest wall battery pack with dual leads, tips projecting over the right atrium and ventricle. Prominent mediastinal contours, similar to prior.  Mild left lung base opacity and mild left hemidiaphragm elevation.  No pleural effusion or pneumothorax.  No acute osseous abnormality.  IMPRESSION: Stable prominent cardiomediastinal contours.  Mild left lung base scarring versus atelectasis.  Original Report Authenticated By: Waneta Martins, M.D.   Ct Head Wo Contrast  05/20/2011  *RADIOLOGY REPORT*  Clinical Data: Left arm weakness and heaviness today.  Code stroke.  CT HEAD WITHOUT CONTRAST  Technique:  Contiguous axial images were obtained from the base of the skull through the vertex without contrast.  Comparison: Head CT 03/01/2011.  Findings: Stent within right middle cerebral artery (M1 segment) unchanged compared to the prior examination.  Mild cerebral and cerebellar atrophy with mild chronic microvascular ischemic changes in the periventricular white matter of the cerebral hemispheres bilaterally.  No definite acute intracranial abnormality. Specifically, no definite signs of acute/subacute ischemia, no focal mass, mass effect, hydrocephalus or abnormal intra or extra- axial fluid collections.  Visualized paranasal sinuses and mastoids are well pneumatized.  No displaced skull fractures are noted.  IMPRESSION: 1.  No acute intracranial abnormalities. 2.  Mild cerebral and cerebellar atrophy with mild chronic microvascular ischemic changes in the white matter, as above.  These results were called by telephone on 05/20/2011  at  01:10 p.m. to  Dr. Fonnie Jarvis, who verbally acknowledged these results.  Original Report Authenticated By: Florencia Reasons, M.D.     Ct Angio Neck W/cm &/or Wo/cm  05/21/2011  *RADIOLOGY REPORT*  Clinical Data:  Left-sided weakness and slurred speech.  CT ANGIOGRAPHY HEAD AND NECK  Technique:  Multidetector CT imaging of the head and neck was performed using the standard protocol during bolus administration of intravenous contrast.  Multiplanar CT image reconstructions including MIPs were obtained to evaluate the vascular anatomy. Carotid stenosis measurements (when applicable) are obtained utilizing NASCET criteria, using the distal internal carotid diameter as the denominator.  Contrast: 50mL OMNIPAQUE IOHEXOL 350 MG/ML IV SOLN,  Comparison:  Cerebral angiography 09/27/2009.  CTA NECK  Findings:  Poor contrast bolus leads to suboptimal opacification of the extracranial intracranial vasculature.  Tortuous but nonstenotic innominate and right common carotid artery.  Tortuous but nonstenotic left common carotid artery. Nonstenotic calcific plaque origin left subclavian.   Moderate stenosis right vertebral origin.  Nonstenotic calcific plaque left vertebral origin.  No significant plaque left carotid bifurcation.  Right internal carotid artery widely patent.  Moderate calcific plaque right external carotid origin.   Both vertebrals widely patent in their cervical segments.  No neck masses.  Airway midline.  Moderate spondylosis.  Mild scarring lung apices.  Pacemaker.   Review of the MIP images confirms the above findings.  IMPRESSION: No significant extracranial internal carotid vascular disease. Moderate stenosis right vertebral origin and right external carotid  origin.  CTA HEAD  Findings:  Widely patent internal carotid arteries in the skull base and supraclinoid segments.  Right MCA stent does not show significant intrastent contrast accumulation.  Severe intimal hyperplasia not excluded.  Diminished intensity of the M2 and M3 vessel enhancement on the right as compared to the left.  No visible left MCA stenosis.  Anterior and posterior  cerebral arteries are widely patent.  Basilar artery widely patent with vertebrals codominant.  Mild atrophy.  Chronic microvascular ischemic change.  No acute large vessel infarct.  Unremarkable orbits, sinuses, and mastoids. Calvarium intact.   Review of the MIP images confirms the above findings.  IMPRESSION: Concern raised for significant right M1 MCA intrastent stenosis, likely intimal hyperplasia.  Recommend formal catheter angiogram.  Original Report Authenticated By: [name], M.D.   Dg Chest Portable 1 View  05/20/2011  *RADIOLOGY REPORT*  Clinical Data: Left-sided weakness.  PORTABLE CHEST - 1 VIEW  Comparison: 03/28/2011.  Findings: The permanent pacer wires are stable.  The heart is enlarged but unchanged.  Low lung volumes with vascular crowding and bibasilar atelectasis but no infiltrates or effusions.  The bony thorax is intact.  IMPRESSION:  1.  Stable cardiac enlargement. 2.  Low lung volumes with vascular crowding and basilar atelectasis.  Original Report Authenticated By: P. Loralie Champagne, M.D.  Results for orders placed during the hospital encounter of 05/20/11  PROTIME-INR      Component Value Range   Prothrombin Time 17.4 (*) 11.6 - 15.2 (seconds)   INR 1.40  0.00 - 1.49   APTT      Component Value Range   aPTT 47 (*) 24 - 37 (seconds)  CBC      Component Value Range   WBC 6.8  4.0 - 10.5 (K/uL)   RBC 3.95  3.87 - 5.11 (MIL/uL)   Hemoglobin 12.5  12.0 - 15.0 (g/dL)   HCT 82.9  56.2 - 13.0 (%)   MCV 91.9  78.0 - 100.0 (fL)   MCH 31.6  26.0 - 34.0 (pg)   MCHC 34.4  30.0 - 36.0 (g/dL)   RDW 86.5  78.4 - 69.6 (%)   Platelets 205  150 - 400 (K/uL)  DIFFERENTIAL      Component Value Range   Neutrophils Relative 69  43 - 77 (%)   Neutro Abs 4.7  1.7 - 7.7 (K/uL)   Lymphocytes Relative 18  12 - 46 (%)   Lymphs Abs 1.2  0.7 - 4.0 (K/uL)   Monocytes Relative 10  3 - 12 (%)   Monocytes Absolute 0.7  0.1 - 1.0 (K/uL)   Eosinophils Relative 3  0 - 5 (%)   Eosinophils  Absolute 0.2  0.0 - 0.7 (K/uL)   Basophils Relative 0  0 - 1 (%)   Basophils Absolute 0.0  0.0 - 0.1 (K/uL)  COMPREHENSIVE METABOLIC PANEL      Component Value Range   Sodium 129 (*) 135 - 145 (mEq/L)   Potassium 4.7  3.5 - 5.1 (mEq/L)   Chloride 95 (*) 96 - 112 (mEq/L)   CO2 22  19 - 32 (mEq/L)   Glucose, Bld 199 (*) 70 - 99 (mg/dL)   BUN 18  6 - 23 (mg/dL)   Creatinine, Ser 2.95  0.50 - 1.10 (mg/dL)   Calcium 9.2  8.4 - 28.4 (mg/dL)   Total Protein 6.4  6.0 - 8.3 (g/dL)   Albumin 3.3 (*) 3.5 - 5.2 (g/dL)   AST 20  0 - 37 (U/L)   ALT 12  0 - 35 (U/L)   Alkaline Phosphatase 52  39 - 117 (U/L)   Total Bilirubin 0.3  0.3 - 1.2 (mg/dL)   GFR calc non Af Amer 63 (*) >90 (mL/min)   GFR calc Af Amer 73 (*) >90 (mL/min)  CK TOTAL AND CKMB      Component Value Range   Total CK 85  7 - 177 (U/L)   CK, MB 3.1  0.3 - 4.0 (ng/mL)   Relative Index RELATIVE INDEX IS INVALID  0.0 - 2.5   TROPONIN I      Component Value Range   Troponin I <0.30  <0.30 (ng/mL)  POCT I-STAT, CHEM 8      Component Value Range   Sodium 134 (*) 135 - 145 (mEq/L)   Potassium 4.7  3.5 - 5.1 (mEq/L)   Chloride 99  96 - 112 (mEq/L)   BUN 20  6 - 23 (mg/dL)   Creatinine, Ser 4.13  0.50 - 1.10 (mg/dL)   Glucose, Bld 244 (*) 70 - 99 (mg/dL)   Calcium, Ion 0.10  2.72 - 1.32 (mmol/L)   TCO2 28  0 - 100 (mmol/L)   Hemoglobin 12.2  12.0 - 15.0 (g/dL)   HCT 53.6  64.4 - 03.4 (%)  HEMOGLOBIN A1C      Component Value Range   Hemoglobin A1C 6.9 (*) <5.7 (%)   Mean Plasma Glucose 151 (*) <117 (mg/dL)  PROTIME-INR      Component Value Range   Prothrombin Time 21.2 (*) 11.6 - 15.2 (seconds)   INR 1.80 (*) 0.00 - 1.49   BASIC METABOLIC PANEL      Component Value Range   Sodium 136  135 - 145 (mEq/L)   Potassium 3.9  3.5 - 5.1 (mEq/L)   Chloride 101  96 - 112 (mEq/L)   CO2 26  19 - 32 (mEq/L)   Glucose, Bld 83  70 - 99 (mg/dL)   BUN 17  6 - 23 (mg/dL)   Creatinine, Ser 7.42  0.50 - 1.10 (mg/dL)   Calcium 9.4  8.4 -  10.5 (mg/dL)   GFR calc non Af Amer 49 (*) >90 (mL/min)   GFR calc Af Amer 57 (*) >90 (mL/min)  GLUCOSE, CAPILLARY      Component Value Range   Glucose-Capillary 155 (*) 70 - 99 (mg/dL)  LIPID PANEL      Component Value Range   Cholesterol 226 (*) 0 - 200 (mg/dL)   Triglycerides 595 (*) <150 (mg/dL)   HDL 38 (*) >63 (mg/dL)   Total CHOL/HDL Ratio 5.9     VLDL 36  0 - 40 (mg/dL)   LDL Cholesterol 875 (*) 0 - 99 (mg/dL)  GLUCOSE, CAPILLARY      Component Value Range   Glucose-Capillary 94  70 - 99 (mg/dL)  GLUCOSE, CAPILLARY      Component Value Range   Glucose-Capillary 199 (*) 70 - 99 (mg/dL)  PROTIME-INR      Component Value Range   Prothrombin Time 23.2 (*) 11.6 - 15.2 (seconds)   INR 2.02 (*) 0.00 - 1.49   GLUCOSE, CAPILLARY      Component Value Range   Glucose-Capillary 170 (*) 70 - 99 (mg/dL)  BASIC METABOLIC PANEL      Component Value Range   Sodium 132 (*) 135 - 145 (mEq/L)   Potassium 4.4  3.5 - 5.1 (mEq/L)   Chloride 96  96 - 112 (mEq/L)  CO2 28  19 - 32 (mEq/L)   Glucose, Bld 111 (*) 70 - 99 (mg/dL)   BUN 20  6 - 23 (mg/dL)   Creatinine, Ser 4.09  0.50 - 1.10 (mg/dL)   Calcium 9.6  8.4 - 81.1 (mg/dL)   GFR calc non Af Amer 50 (*) >90 (mL/min)   GFR calc Af Amer 58 (*) >90 (mL/min)  GLUCOSE, CAPILLARY      Component Value Range   Glucose-Capillary 112 (*) 70 - 99 (mg/dL)   Comment 1 Notify RN     Comment 2 Documented in Chart    GLUCOSE, CAPILLARY      Component Value Range   Glucose-Capillary 122 (*) 70 - 99 (mg/dL)   Comment 1 Notify RN     Comment 2 Documented in Chart    GLUCOSE, CAPILLARY      Component Value Range   Glucose-Capillary 140 (*) 70 - 99 (mg/dL)     Ousman Dise,D Caryn Bee

## 2011-05-22 NOTE — Evaluation (Signed)
Occupational Therapy Evaluation Patient Details Name: [name] MRN: [id number] DOB: 24-Oct-1934 Today's Date: 05/22/2011  Problem List:  Patient Active Problem List  Diagnoses  . Wears glasses  . Wears dentures  . Vomiting  . Dizziness  . Bradycardia, symptomatic  . DM (diabetes mellitus), IDDM  . Dehydration  . Hyponatremia  . History of breast cancer, right, T3, N1, mastectomy 02/27/2007.  Marland Kitchen LBBB (left bundle branch block)  . First degree AV block  . Fatigue, secondary to bradycardia  . Acute left-sided weakness  . Pacemaker  . Familial tremor    Past Medical History:  Past Medical History  Diagnosis Date  . Diabetes mellitus     Type 2  . Stroke     TIA per patient history form dated 02/15/10.  . Goiter   . Arthritis   . Dizziness - light-headed   . Family history of breast cancer     Did not specify what member of family per history form dated 02/15/10.  . Menopause   . Cancer     rt. breast ca  . Slow heart rate   . Hyperlipidemia   . Hypertension   . PONV (postoperative nausea and vomiting)    Past Surgical History:  Past Surgical History  Procedure Date  . Joint replacement     Knee - ask patient to clarify which knee or both.  . Breast surgery     lumpectomy  . Brain surgery     Stent placement  . Gallbladder surgery   . Mastectomy   . Thyroid surgery     due to Goiter  . Cholecystectomy   . Total knee arthroplasty 2008    left    OT Assessment/Plan/Recommendation OT Assessment Clinical Impression Statement: 77 yo female s/p Lt UE and Lt LE weakness r/o TIA. Pt currently at or nearbasline of function. NO acute needs identified. OT Recommendation/Assessment: Patient does not need any further OT services OT Recommendation Follow Up Recommendations: No OT follow up Equipment Recommended: None recommended by OT OT Goals    OT Evaluation Precautions/Restrictions  Precautions Precautions: Fall Required Braces or Orthoses:  No Restrictions Weight Bearing Restrictions: No Prior Functioning Home Living Lives With: Alone Receives Help From: Friend(s) Type of Home: Mobile home Home Layout: One level Home Access: Ramped entrance Bathroom Shower/Tub: Walk-in shower;Door [employer] Toilet: Standard Home Adaptive Equipment: Grab bars around toilet;Straight cane;Walker - rolling;Shower chair with back;Bedside commode/3-in-1 Prior Function Level of Independence: Independent with basic ADLs Driving: Yes Vocation: Retired ADL ADL Eating/Feeding: Performed;Modified independent Where Assessed - Eating/Feeding: Chair Grooming: Performed;Wash/dry hands;Brushing hair;Modified independent;Teeth care;Denture care Where Assessed - Grooming: Standing at sink Upper Body Dressing: Performed;Modified independent Where Assessed - Upper Body Dressing: Standing Toilet Transfer: Performed;Modified independent Toilet Transfer Method: Proofreader: Regular height toilet Toileting - Clothing Manipulation: Performed;Modified independent Where Assessed - Toileting Clothing Manipulation: Sit to stand from 3-in-1 or toilet Toileting - Hygiene: Performed;Modified independent Where Assessed - Toileting Hygiene: Sit to stand from 3-in-1 or toilet Tub/Shower Transfer: Performed;Supervision/safety Tub/Shower Transfer Method: Science writer: Walk in shower Ambulation Related to ADLs: Pt ambulating Mod I within the room without deficits noted ADL Comments: Pt completed bed mobility, sink level grooming, shower transfer, toilet transfer and stand<>sit in chair Vision/Perception  Vision - History Baseline Vision: Wears glasses all the time Cognition Cognition Arousal/Alertness: Awake/alert Overall Cognitive Status: Appears within functional limits for tasks assessed Orientation Level: Oriented X4 Sensation/Coordination Sensation Light Touch: Appears Intact Coordination Gross Motor  Movements are Fluid and Coordinated: Yes Fine Motor Movements are Fluid and Coordinated: Yes Extremity Assessment RUE Assessment RUE Assessment: Within Functional Limits (grossly) LUE Assessment LUE Assessment: Within Functional Limits (grossly) Mobility  Bed Mobility Bed Mobility: Yes Supine to Sit: 6: Modified independent (Device/Increase time);HOB elevated (Comment degrees) Transfers Transfers: Yes Sit to Stand: 5: Supervision;From bed;With upper extremity assist Sit to Stand Details (indicate cue type and reason): Supervision for safety.  Pt stood without therapist and RW in front.  Supervision for safety with cues for proper RW placement prior to standing. Stand to Sit: 5: Supervision;To chair/3-in-1;With upper extremity assist;With armrests Stand to Sit Details: Supervision for safety with max cues for hand placement. Exercises   End of Session OT - End of Session Equipment Utilized During Treatment: Gait belt Activity Tolerance: Patient tolerated treatment well Patient left: in chair;with call bell in reach Nurse Communication: Mobility status for transfers;Mobility status for ambulation General Behavior During Session: [facility] for tasks performed Cognition: [facility] for tasks performed   Lucile Shutters 05/22/2011, 2:56 PM  Pager: [phone]

## 2011-05-22 NOTE — H&P (View-Only) (Signed)
Subjective: Pt states she feels better this am, denies any new c/o Objective: Vital signs in last 24 hours: Temp:  [97.4 F (36.3 C)-98.7 F (37.1 C)] 98.7 F (37.1 C) (03/11 1800) Pulse Rate:  [58-68] 62  (03/11 1800) Resp:  [18-19] 19  (03/11 1800) BP: (112-142)/(55-82) 142/82 mmHg (03/11 1800) SpO2:  [92 %-97 %] 95 % (03/11 1800) Last BM Date: 05/18/11 Intake/Output from previous day:   Intake/Output this shift:      General Appearance:    Alert, cooperative, no distress, appears stated age  Lungs:     Clear to auscultation bilaterally, respirations unlabored   Heart:    Regular, S1 and S2 normal, no murmur, rub   or gallop  Abdomen:     Soft, non-tender, bowel sounds active all four quadrants,    no masses, no organomegaly  Extremities:   Extremities normal, atraumatic, no cyanosis or edema  Neurologic:   CNII-XII intact, nonfocal     Weight change:  No intake or output data in the 24 hours ending 05/21/11 2027  Lab Results:   Basename 05/21/11 0615 05/20/11 1257 05/20/11 1247  NA 136 134* --  K 3.9 4.7 --  CL 101 99 --  CO2 26 -- 22  GLUCOSE 83 207* --  BUN 17 20 --  CREATININE 1.08 1.00 --  CALCIUM 9.4 -- 9.2    Basename 05/20/11 1257 05/20/11 1247  WBC -- 6.8  HGB 12.2 12.5  HCT 36.0 36.3  PLT -- 205  MCV -- 91.9   PT/INR  Basename 05/21/11 0615 05/20/11 1247  LABPROT 21.2* 17.4*  INR 1.80* 1.40   ABG No results found for this basename: PHART:2,PCO2:2,PO2:2,HCO3:2 in the last 72 hours  Micro Results: No results found for this or any previous visit (from the past 240 hour(s)). Studies/Results: Ct Angio Head W/cm &/or Wo Cm  05/21/2011  *RADIOLOGY REPORT*  Clinical Data:  Left-sided weakness and slurred speech.  CT ANGIOGRAPHY HEAD AND NECK  Technique:  Multidetector CT imaging of the head and neck was performed using the standard protocol during bolus administration of intravenous contrast.  Multiplanar CT image reconstructions including MIPs  were obtained to evaluate the vascular anatomy. Carotid stenosis measurements (when applicable) are obtained utilizing NASCET criteria, using the distal internal carotid diameter as the denominator.  Contrast: 50mL OMNIPAQUE IOHEXOL 350 MG/ML IV SOLN,  Comparison:  Cerebral angiography 09/27/2009.  CTA NECK  Findings:  Poor contrast bolus leads to suboptimal opacification of the extracranial intracranial vasculature.  Tortuous but nonstenotic innominate and right common carotid artery.  Tortuous but nonstenotic left common carotid artery. Nonstenotic calcific plaque origin left subclavian.   Moderate stenosis right vertebral origin.  Nonstenotic calcific plaque left vertebral origin.  No significant plaque left carotid bifurcation.  Right internal carotid artery widely patent.  Moderate calcific plaque right external carotid origin.   Both vertebrals widely patent in their cervical segments.  No neck masses.  Airway midline.  Moderate spondylosis.  Mild scarring lung apices.  Pacemaker.   Review of the MIP images confirms the above findings.  IMPRESSION: No significant extracranial internal carotid vascular disease. Moderate stenosis right vertebral origin and right external carotid origin.  CTA HEAD  Findings:  Widely patent internal carotid arteries in the skull base and supraclinoid segments.  Right MCA stent does not show significant intrastent contrast accumulation.  Severe intimal hyperplasia not excluded.  Diminished intensity of the M2 and M3 vessel enhancement on the right as compared to the left.    No visible left MCA stenosis.  Anterior and posterior cerebral arteries are widely patent.  Basilar artery widely patent with vertebrals codominant.  Mild atrophy.  Chronic microvascular ischemic change.  No acute large vessel infarct.  Unremarkable orbits, sinuses, and mastoids. Calvarium intact.   Review of the MIP images confirms the above findings.  IMPRESSION: Concern raised for significant right M1 MCA  intrastent stenosis, likely intimal hyperplasia.  Recommend formal catheter angiogram.  Original Report Authenticated By: JOHN T. CURNES, M.D.   Dg Chest 2 View  05/20/2011  *RADIOLOGY REPORT*  Clinical Data: Hypertension, diabetes  CHEST - 2 VIEW  Comparison: 05/20/2011  Findings: Cardiomegaly.  Left chest wall battery pack with dual leads, tips projecting over the right atrium and ventricle. Prominent mediastinal contours, similar to prior.  Mild left lung base opacity and mild left hemidiaphragm elevation.  No pleural effusion or pneumothorax.  No acute osseous abnormality.  IMPRESSION: Stable prominent cardiomediastinal contours.  Mild left lung base scarring versus atelectasis.  Original Report Authenticated By: ANDREW J. DELGAIZO, M.D.   Ct Head Wo Contrast  05/20/2011  *RADIOLOGY REPORT*  Clinical Data: Left arm weakness and heaviness today.  Code stroke.  CT HEAD WITHOUT CONTRAST  Technique:  Contiguous axial images were obtained from the base of the skull through the vertex without contrast.  Comparison: Head CT 03/01/2011.  Findings: Stent within right middle cerebral artery (M1 segment) unchanged compared to the prior examination.  Mild cerebral and cerebellar atrophy with mild chronic microvascular ischemic changes in the periventricular white matter of the cerebral hemispheres bilaterally.  No definite acute intracranial abnormality. Specifically, no definite signs of acute/subacute ischemia, no focal mass, mass effect, hydrocephalus or abnormal intra or extra- axial fluid collections.  Visualized paranasal sinuses and mastoids are well pneumatized.  No displaced skull fractures are noted.  IMPRESSION: 1.  No acute intracranial abnormalities. 2.  Mild cerebral and cerebellar atrophy with mild chronic microvascular ischemic changes in the white matter, as above.  These results were called by telephone on 05/20/2011  at  01:10 p.m. to  Dr. Bednar, who verbally acknowledged these results.  Original  Report Authenticated By: DANIEL W. ENTRIKIN, M.D.   Ct Angio Neck W/cm &/or Wo/cm  05/21/2011  *RADIOLOGY REPORT*  Clinical Data:  Left-sided weakness and slurred speech.  CT ANGIOGRAPHY HEAD AND NECK  Technique:  Multidetector CT imaging of the head and neck was performed using the standard protocol during bolus administration of intravenous contrast.  Multiplanar CT image reconstructions including MIPs were obtained to evaluate the vascular anatomy. Carotid stenosis measurements (when applicable) are obtained utilizing NASCET criteria, using the distal internal carotid diameter as the denominator.  Contrast: 50mL OMNIPAQUE IOHEXOL 350 MG/ML IV SOLN,  Comparison:  Cerebral angiography 09/27/2009.  CTA NECK  Findings:  Poor contrast bolus leads to suboptimal opacification of the extracranial intracranial vasculature.  Tortuous but nonstenotic innominate and right common carotid artery.  Tortuous but nonstenotic left common carotid artery. Nonstenotic calcific plaque origin left subclavian.   Moderate stenosis right vertebral origin.  Nonstenotic calcific plaque left vertebral origin.  No significant plaque left carotid bifurcation.  Right internal carotid artery widely patent.  Moderate calcific plaque right external carotid origin.   Both vertebrals widely patent in their cervical segments.  No neck masses.  Airway midline.  Moderate spondylosis.  Mild scarring lung apices.  Pacemaker.   Review of the MIP images confirms the above findings.  IMPRESSION: No significant extracranial internal carotid vascular disease. Moderate stenosis right vertebral   origin and right external carotid origin.  CTA HEAD  Findings:  Widely patent internal carotid arteries in the skull base and supraclinoid segments.  Right MCA stent does not show significant intrastent contrast accumulation.  Severe intimal hyperplasia not excluded.  Diminished intensity of the M2 and M3 vessel enhancement on the right as compared to the left.  No  visible left MCA stenosis.  Anterior and posterior cerebral arteries are widely patent.  Basilar artery widely patent with vertebrals codominant.  Mild atrophy.  Chronic microvascular ischemic change.  No acute large vessel infarct.  Unremarkable orbits, sinuses, and mastoids. Calvarium intact.   Review of the MIP images confirms the above findings.  IMPRESSION: Concern raised for significant right M1 MCA intrastent stenosis, likely intimal hyperplasia.  Recommend formal catheter angiogram.  Original Report Authenticated By: JOHN T. CURNES, M.D.   Dg Chest Portable 1 View  05/20/2011  *RADIOLOGY REPORT*  Clinical Data: Left-sided weakness.  PORTABLE CHEST - 1 VIEW  Comparison: 03/28/2011.  Findings: The permanent pacer wires are stable.  The heart is enlarged but unchanged.  Low lung volumes with vascular crowding and bibasilar atelectasis but no infiltrates or effusions.  The bony thorax is intact.  IMPRESSION:  1.  Stable cardiac enlargement. 2.  Low lung volumes with vascular crowding and basilar atelectasis.  Original Report Authenticated By: P. MARK GALLERANI, M.D.   Medications: Scheduled Meds:   . calcium carbonate  1 tablet Oral Daily  . cholecalciferol  2,000 Units Oral Daily  . citalopram  10 mg Oral Daily  . vitamin B-12  2,500 mcg Oral Daily  . glipiZIDE  10 mg Oral BID AC  . letrozole  2.5 mg Oral Daily  . lisinopril  10 mg Oral QHS  . magnesium oxide  400 mg Oral Daily  . omega-3 acid ethyl esters  2 g Oral Daily  . pravastatin  20 mg Oral q1800  . propranolol  20 mg Oral BID  . warfarin  10 mg Oral Once  . warfarin  7.5 mg Oral ONCE-1800  . Warfarin - Pharmacist Dosing Inpatient   Does not apply q1800  . DISCONTD: NON FORMULARY 20 mg/day  20 mg/day Oral QHS  . DISCONTD: warfarin  10 mg Oral Once  . DISCONTD: warfarin  10 mg Oral ONCE-1800  . DISCONTD: Warfarin - Pharmacist Dosing Inpatient   Does not apply q1800   Continuous Infusions:   . DISCONTD: sodium chloride 75  mL/hr at 05/20/11 2100   PRN Meds:.iohexol, ondansetron (ZOFRAN) IV, senna-docusate Assessment/Plan: .Acute left-sided weakness - TIA vs CVA  - CTA of head and neck with concern raised about intrastent stenosis and pt discussed with Dr Deveshwar recommeding cerebral angiogram now vs coumadin+antiplatelet agent now and then angiogram only if pt agan symptomatic after> will await neuro input, pt was was seen by Dr Sethi in the past and she wants his input as well before deciding  - PT OT consulted  - await Neuro attending input as above .DM (diabetes mellitus)  -continue glipizide, and SSI .Hyponatremia  - resolved with hydration and off HCTZ .Pacemaker .Familial tremor  - continue propanolol  H/o Bradycardia, symptomatic  -s/p pacemaker  .Afib  -rate controlled, continue coumadin  .History of breast cancer, right, T3, N1, mastectomy 02/27/2007.       LOS: 1 day   Layni Kreamer C 05/21/2011, 8:27 PM      

## 2011-05-22 NOTE — Progress Notes (Signed)
Subjective: Pt states she feels better this am, denies any new c/o Objective: Vital signs in last 24 hours: Temp:  [97.4 F (36.3 C)-98.7 F (37.1 C)] 98.5 F (36.9 C) (03/12 1400) Pulse Rate:  [60-67] 60  (03/12 1400) Resp:  [18-19] 19  (03/12 1400) BP: (102-152)/(64-82) 109/65 mmHg (03/12 1400) SpO2:  [94 %-96 %] 95 % (03/12 1400) Last BM Date: 05/18/11 Intake/Output from previous day: 03/11 0701 - 03/12 0700 In: 120 [P.O.:120] Out: -  Intake/Output this shift:      General Appearance:    Alert, cooperative, no distress, appears stated age  Lungs:     Clear to auscultation bilaterally, respirations unlabored   Heart:    Regular, S1 and S2 normal, no murmur, rub   or gallop  Abdomen:     Soft, non-tender, bowel sounds active all four quadrants,    no masses, no organomegaly  Extremities:   Extremities normal, atraumatic, no cyanosis or edema  Neurologic:   CNII-XII intact, nonfocal     Weight change:   Intake/Output Summary (Last 24 hours) at 05/22/11 1706 Last data filed at 05/22/11 0600  Gross per 24 hour  Intake    120 ml  Output      0 ml  Net    120 ml    Lab Results:   [facility] 05/22/11 05/21/11 0615  NA 132* 136  K 4.4 3.9  CL 96 101  CO2 28 26  GLUCOSE 111* 83  BUN 20 17  CREATININE 1.06 1.08  CALCIUM 9.6 9.4    Basename 05/20/11 1257 05/20/11 1247  WBC -- 6.8  HGB 12.2 12.5  HCT 36.0 36.3  PLT -- 205  MCV -- 91.9   PT/INR  Basename 05/22/11 0608 05/21/11 0615  LABPROT 23.2* 21.2*  INR 2.02* 1.80*   ABG No results found for this basename: PHART:2,PCO2:2,PO2:2,HCO3:2 in the last 72 hours  Micro Results: No results found for this or any previous visit (from the past 240 hour(s)). Studies/Results: Ct Angio Head W/cm &/or Wo Cm  05/21/2011  *RADIOLOGY REPORT*  Clinical Data:  Left-sided weakness and slurred speech.  CT ANGIOGRAPHY HEAD AND NECK  Technique:  Multidetector CT imaging of the head and neck was performed using the standard  protocol during bolus administration of intravenous contrast.  Multiplanar CT image reconstructions including MIPs were obtained to evaluate the vascular anatomy. Carotid stenosis measurements (when applicable) are obtained utilizing NASCET criteria, using the distal internal carotid diameter as the denominator.  Contrast: 50mL OMNIPAQUE IOHEXOL 350 MG/ML IV SOLN,  Comparison:  Cerebral angiography 09/27/2009.  CTA NECK  Findings:  Poor contrast bolus leads to suboptimal opacification of the extracranial intracranial vasculature.  Tortuous but nonstenotic innominate and right common carotid artery.  Tortuous but nonstenotic left common carotid artery. Nonstenotic calcific plaque origin left subclavian.   Moderate stenosis right vertebral origin.  Nonstenotic calcific plaque left vertebral origin.  No significant plaque left carotid bifurcation.  Right internal carotid artery widely patent.  Moderate calcific plaque right external carotid origin.   Both vertebrals widely patent in their cervical segments.  No neck masses.  Airway midline.  Moderate spondylosis.  Mild scarring lung apices.  Pacemaker.   Review of the MIP images confirms the above findings.  IMPRESSION: No significant extracranial internal carotid vascular disease. Moderate stenosis right vertebral origin and right external carotid origin.  CTA HEAD  Findings:  Widely patent internal carotid arteries in the skull base and supraclinoid segments.  Right MCA stent does  not show significant intrastent contrast accumulation.  Severe intimal hyperplasia not excluded.  Diminished intensity of the M2 and M3 vessel enhancement on the right as compared to the left.  No visible left MCA stenosis.  Anterior and posterior cerebral arteries are widely patent.  Basilar artery widely patent with vertebrals codominant.  Mild atrophy.  Chronic microvascular ischemic change.  No acute large vessel infarct.  Unremarkable orbits, sinuses, and mastoids. Calvarium intact.    Review of the MIP images confirms the above findings.  IMPRESSION: Concern raised for significant right M1 MCA intrastent stenosis, likely intimal hyperplasia.  Recommend formal catheter angiogram.  Original Report Authenticated By: [name], M.D.   Dg Chest 2 View  05/20/2011  *RADIOLOGY REPORT*  Clinical Data: Hypertension, diabetes  CHEST - 2 VIEW  Comparison: 05/20/2011  Findings: Cardiomegaly.  Left chest wall battery pack with dual leads, tips projecting over the right atrium and ventricle. Prominent mediastinal contours, similar to prior.  Mild left lung base opacity and mild left hemidiaphragm elevation.  No pleural effusion or pneumothorax.  No acute osseous abnormality.  IMPRESSION: Stable prominent cardiomediastinal contours.  Mild left lung base scarring versus atelectasis.  Original Report Authenticated By: Waneta Martins, M.D.   Ct Angio Neck W/cm &/or Wo/cm  05/21/2011  *RADIOLOGY REPORT*  Clinical Data:  Left-sided weakness and slurred speech.  CT ANGIOGRAPHY HEAD AND NECK  Technique:  Multidetector CT imaging of the head and neck was performed using the standard protocol during bolus administration of intravenous contrast.  Multiplanar CT image reconstructions including MIPs were obtained to evaluate the vascular anatomy. Carotid stenosis measurements (when applicable) are obtained utilizing NASCET criteria, using the distal internal carotid diameter as the denominator.  Contrast: 50mL OMNIPAQUE IOHEXOL 350 MG/ML IV SOLN,  Comparison:  Cerebral angiography 09/27/2009.  CTA NECK  Findings:  Poor contrast bolus leads to suboptimal opacification of the extracranial intracranial vasculature.  Tortuous but nonstenotic innominate and right common carotid artery.  Tortuous but nonstenotic left common carotid artery. Nonstenotic calcific plaque origin left subclavian.   Moderate stenosis right vertebral origin.  Nonstenotic calcific plaque left vertebral origin.  No significant plaque left  carotid bifurcation.  Right internal carotid artery widely patent.  Moderate calcific plaque right external carotid origin.   Both vertebrals widely patent in their cervical segments.  No neck masses.  Airway midline.  Moderate spondylosis.  Mild scarring lung apices.  Pacemaker.   Review of the MIP images confirms the above findings.  IMPRESSION: No significant extracranial internal carotid vascular disease. Moderate stenosis right vertebral origin and right external carotid origin.  CTA HEAD  Findings:  Widely patent internal carotid arteries in the skull base and supraclinoid segments.  Right MCA stent does not show significant intrastent contrast accumulation.  Severe intimal hyperplasia not excluded.  Diminished intensity of the M2 and M3 vessel enhancement on the right as compared to the left.  No visible left MCA stenosis.  Anterior and posterior cerebral arteries are widely patent.  Basilar artery widely patent with vertebrals codominant.  Mild atrophy.  Chronic microvascular ischemic change.  No acute large vessel infarct.  Unremarkable orbits, sinuses, and mastoids. Calvarium intact.   Review of the MIP images confirms the above findings.  IMPRESSION: Concern raised for significant right M1 MCA intrastent stenosis, likely intimal hyperplasia.  Recommend formal catheter angiogram.  Original Report Authenticated By: [name], M.D.   Medications: Scheduled Meds:    . calcium carbonate  1 tablet Oral Daily  .  cholecalciferol  2,000 Units Oral Daily  . citalopram  10 mg Oral Daily  . vitamin B-12  2,500 mcg Oral Daily  . glipiZIDE  10 mg Oral BID AC  . letrozole  2.5 mg Oral Daily  . lisinopril  10 mg Oral QHS  . magnesium oxide  400 mg Oral Daily  . omega-3 acid ethyl esters  2 g Oral Daily  . pravastatin  20 mg Oral q1800  . propranolol  20 mg Oral BID  . warfarin  7.5 mg Oral ONCE-1800  . warfarin  7.5 mg Oral ONCE-1800  . Warfarin - Pharmacist Dosing Inpatient   Does not apply q1800    Continuous Infusions:    . DISCONTD: sodium chloride 75 mL/hr at 05/20/11 2100   PRN Meds:.ondansetron (ZOFRAN) IV, senna-docusate Assessment/Plan: .Acute left-sided weakness - TIA vs CVA  - CTA of head and neck with concern raised about intrastent stenosis and pt discussed with Dr Corliss Skains recommeding cerebral arteriogarm now vs coumadin+antiplatelet agent now and then angiogram only if pt again symptomatic after -dicussed with Neuro and pt has decided to proceed with cerebral arteriogram. -discussed with Dr Loni Beckwith and he wants coumadin stopped for now and pt placed on plavix and ASA till after the arteriogram - PT OT following  .DM (diabetes mellitus)  -continue glipizide, and SSI .Hyponatremia  - resolved with hydration and off HCTZ,, but now trending down after IVF saline locked- will resume ivf and follow .Pacemaker .Familial tremor  - continue propanolol  H/o Bradycardia, symptomatic  -s/p pacemaker  .Afib  -rate controlled, continue coumadin  .History of breast cancer, right, T3, N1, mastectomy 02/27/2007.       LOS: 2 days   Idalia Allbritton C 05/22/2011, 5:06 PM

## 2011-05-23 LAB — PROTIME-INR: INR: 1.96 — ABNORMAL HIGH (ref 0.00–1.49)

## 2011-05-23 LAB — CBC
MCV: 90.4 fL (ref 78.0–100.0)
Platelets: 188 10*3/uL (ref 150–400)
RBC: 3.75 MIL/uL — ABNORMAL LOW (ref 3.87–5.11)
WBC: 4.4 10*3/uL (ref 4.0–10.5)

## 2011-05-23 LAB — BASIC METABOLIC PANEL
BUN: 20 mg/dL (ref 6–23)
Chloride: 100 mEq/L (ref 96–112)
GFR calc Af Amer: 76 mL/min — ABNORMAL LOW (ref >90)
GFR calc non Af Amer: 66 mL/min — ABNORMAL LOW (ref >90)
Potassium: 3.9 mEq/L (ref 3.5–5.1)
Sodium: 135 mEq/L (ref 135–145)

## 2011-05-23 LAB — GLUCOSE, CAPILLARY
Glucose-Capillary: 77 mg/dL (ref 70–99)
Glucose-Capillary: 92 mg/dL (ref 70–99)
Glucose-Capillary: 211 mg/dL — ABNORMAL HIGH (ref 70–99)

## 2011-05-23 LAB — APTT: aPTT: 56 seconds — ABNORMAL HIGH (ref 24–37)

## 2011-05-23 MED ORDER — HEPARIN (PORCINE) IN NACL 100-0.45 UNIT/ML-% IJ SOLN
1000.0000 [IU]/h | INTRAMUSCULAR | Status: DC
  Filled 2011-05-23: qty 250

## 2011-05-23 MED ORDER — HEPARIN (PORCINE) IN NACL 100-0.45 UNIT/ML-% IJ SOLN
12.0000 [IU]/kg/h | INTRAMUSCULAR | Status: DC
  Administered 2011-05-23: 12 [IU]/kg/h via INTRAVENOUS
  Filled 2011-05-23: qty 250

## 2011-05-23 NOTE — Discharge Instructions (Signed)
STROKE/TIA DISCHARGE INSTRUCTIONS SMOKING Cigarette smoking nearly doubles your risk of having a stroke & is the single most alterable risk factor  If you smoke or have smoked in the last 12 months, you are advised to quit smoking for your health.  Most of the excess cardiovascular risk related to smoking disappears within a year of stopping.  Ask you doctor about anti-smoking medications  [place] Quit Line: 1-800-QUIT NOW  Free Smoking Cessation Classes [phone]  CHOLESTEROL Know your levels; limit fat & cholesterol in your diet  Lipid Panel     Component Value Date/Time   CHOL 226* 05/21/2011 0615   TRIG 182* 05/21/2011 0615   HDL 38* 05/21/2011 0615   CHOLHDL 5.9 05/21/2011 0615   VLDL 36 05/21/2011 0615   LDLCALC 152* 05/21/2011 0615      Many patients benefit from treatment even if their cholesterol is at goal.  Goal: Total Cholesterol (CHOL) less than 160  Goal:  Triglycerides (TRIG) less than 150  Goal:  HDL greater than 40  Goal:  LDL (LDLCALC) less than 100   BLOOD PRESSURE American Stroke Association blood pressure target is less that 120/80 mm/Hg  Your discharge blood pressure is:  BP: 118/75 mmHg  Monitor your blood pressure  Limit your salt and alcohol intake  Many individuals will require more than one medication for high blood pressure  DIABETES (A1c is a blood sugar average for last 3 months) Goal HGBA1c is under 7% (HBGA1c is blood sugar average for last 3 months)  Diabetes: {STROKE DC DIABETES:22357}    Lab Results  Component Value Date   HGBA1C 6.9* 05/21/2011     Your HGBA1c can be lowered with medications, healthy diet, and exercise.  Check your blood sugar as directed by your physician  Call your physician if you experience unexplained or low blood sugars.  PHYSICAL ACTIVITY/REHABILITATION Goal is 30 minutes at least 4 days per week    {STROKE DC ACTIVITY/REHAB:22359}  Activity decreases your risk of heart attack and stroke and makes your heart  stronger.  It helps control your weight and blood pressure; helps you relax and can improve your mood.  Participate in a regular exercise program.  Talk with your doctor about the best form of exercise for you (dancing, walking, swimming, cycling).  DIET/WEIGHT Goal is to maintain a healthy weight  Your discharge diet is:   *** liquids Your height is:  Height: 5\' 6"  (167.6 cm) Your current weight is: Weight: 99.338 kg (219 lb) Your Body Mass Index (BMI) is:  BMI (Calculated): 35.4   Following the type of diet specifically designed for you will help prevent another stroke.  Your goal weight range is:  ***  Your goal Body Mass Index (BMI) is 19-24.  Healthy food habits can help reduce 3 risk factors for stroke:  High cholesterol, hypertension, and excess weight.  RESOURCES Stroke/Support Group:  Call [phone]  they meet the 3rd Sunday of the month on the Rehab Unit at [facility], [place] ( no meetings June, July & Aug).  STROKE EDUCATION PROVIDED/REVIEWED AND GIVEN TO PATIENT Stroke warning signs and symptoms How to activate emergency medical system (call 911). Medications prescribed at discharge. Need for follow-up after discharge. Personal risk factors for stroke. Pneumonia vaccine given:   {STROKE DC YES/NO/DATE:22363} Flu vaccine given:   {STROKE DC YES/NO/DATE:22363} My questions have been answered, the writing is legible, and I understand these instructions.  I will adhere to these goals & educational materials that have been  provided to me after my discharge from the hospital.

## 2011-05-23 NOTE — Progress Notes (Signed)
VASCULAR LAB PRELIMINARY  PRELIMINARY  PRELIMINARY  PRELIMINARY  Transcranial Doppler  completed.    Preliminary report: TCD completed  Chauncey Sciulli D, RVS 05/23/2011, 1:37 PM

## 2011-05-23 NOTE — Progress Notes (Signed)
ANTICOAGULATION CONSULT NOTE - Initial Consult  Pharmacy Consult for Heparin Indication: atrial fibrillation and acute stroke  Allergies  Allergen Reactions  . Oxycontin Nausea And Vomiting  . Crestor (Rosuvastatin Calcium) Other (See Comments)    dizziness  . Statins     Cramping in legs  . Zetia (Ezetimibe) Other (See Comments)    dizziness    Patient Measurements: Height: 5\' 6"  (167.6 cm) Weight: 219 lb (99.338 kg) IBW/kg (Calculated) : 59.3   Vital Signs: Temp: 97.6 F (36.4 C) (03/13 1000) Temp src: Oral (03/13 1000) BP: 114/67 mmHg (03/13 1000) Pulse Rate: 62  (03/13 1000)  Labs:  Basename 05/23/11 0512 05/22/11 0608 05/22/11 05/21/11 0615  HGB 11.6* -- -- --  HCT 33.9* -- -- --  PLT 188 -- -- --  APTT 56* -- -- --  LABPROT 22.7* 23.2* -- 21.2*  INR 1.96* 2.02* -- 1.80*  HEPARINUNFRC -- -- -- --  CREATININE 0.85 -- 1.06 1.08  CKTOTAL -- -- -- --  CKMB -- -- -- --  TROPONINI -- -- -- --   Estimated Creatinine Clearance: 69 ml/min (by C-G formula based on Cr of 0.85).  Medical History: Past Medical History  Diagnosis Date  . Diabetes mellitus     Type 2  . Stroke     TIA per patient history form dated 02/15/10.  . Goiter   . Arthritis   . Dizziness - light-headed   . Family history of breast cancer     Did not specify what member of family per history form dated 02/15/10.  . Menopause   . Cancer     rt. breast ca  . Slow heart rate   . Hyperlipidemia   . Hypertension   . PONV (postoperative nausea and vomiting)     Medications:  Prescriptions prior to admission  Medication Sig Dispense Refill  . CALCIUM PO Take 630 mg by mouth daily.       . Cholecalciferol (VITAMIN D) 2000 UNITS tablet Take 2,000 Units by mouth daily.        . citalopram (CELEXA) 20 MG tablet Take 10 mg by mouth daily.        . Cyanocobalamin (VITAMIN B-12) 2500 MCG SUBL Place 1 tablet under the tongue daily.        . fish oil-omega-3 fatty acids 1000 MG capsule Take 2 g by  mouth daily.        Marland Kitchen glipiZIDE (GLUCOTROL XL) 10 MG 24 hr tablet Take 10 mg by mouth 2 (two) times daily.        . hydrochlorothiazide (HYDRODIURIL) 25 MG tablet Take 12.5 mg by mouth daily.      Marland Kitchen letrozole (FEMARA) 2.5 MG tablet Take 1 tablet (2.5 mg total) by mouth daily.  30 tablet  11  . lisinopril (PRINIVIL,ZESTRIL) 10 MG tablet Take 10 mg by mouth every evening.        . Magnesium 250 MG TABS Take 2 tablets by mouth daily.        . metFORMIN (GLUCOPHAGE) 500 MG tablet Take 500 mg by mouth 2 (two) times daily.       . propranolol (INDERAL) 40 MG tablet Take 20 mg by mouth 2 (two) times daily.       Marland Kitchen warfarin (COUMADIN) 5 MG tablet Take 5 mg by mouth daily. 5mg  mwf and 7.5mg  all other days       Scheduled:    . aspirin EC  81 mg Oral QPC breakfast  .  calcium carbonate  1 tablet Oral Daily  . cholecalciferol  2,000 Units Oral Daily  . citalopram  10 mg Oral Daily  . clopidogrel  75 mg Oral Q breakfast  . vitamin B-12  2,500 mcg Oral Daily  . glipiZIDE  10 mg Oral BID AC  . letrozole  2.5 mg Oral Daily  . lisinopril  10 mg Oral QHS  . magnesium oxide  400 mg Oral Daily  . omega-3 acid ethyl esters  2 g Oral Daily  . pravastatin  20 mg Oral q1800  . propranolol  20 mg Oral BID  . Warfarin - Pharmacist Dosing Inpatient   Does not apply q1800  . DISCONTD: warfarin  7.5 mg Oral ONCE-1800    Assessment: 77 y/o female patient admitted with likely TIA, has been on coumadin but now on hold d/t plan for angiogram. INR subtherapeutic, will start heparin gtt in setting afib and d/c 1 hour before angiogram.   Goal of Therapy:  Xa=0.3-0.5   Plan:  Begin heparin gtt at 900 units/hr and check 8 hour heparin level. Daily heparin level with cbc.  Verlene Mayer, PharmD, BCPS 3/13/20131:12 PM

## 2011-05-23 NOTE — Progress Notes (Signed)
Physical Therapy Treatment Patient Details Name: DELAILA NAND MRN: [id number] DOB: 15-Jan-1935 Today's Date: 05/23/2011  PT Assessment/Plan  PT - Assessment/Plan Comments on Treatment Session: Patient able to ambulate 200 feet and ascend and descend 10 steps with supervision.  Patient demonstrates improved balance.  Patient states that LLE weakness is back to her baseline. PT Plan: Discharge plan needs to be updated PT Frequency: Min 4X/week Follow Up Recommendations: Outpatient PT Equipment Recommended: None recommended by PT PT Goals  Acute Rehab PT Goals PT Goal Formulation: With patient Time For Goal Achievement: 7 days Pt will go Sit to Stand: Independently;with upper extremity assist PT Goal: Sit to Stand - Progress: Met Pt will go Stand to Sit: with supervision;with upper extremity assist PT Goal: Stand to Sit - Progress: Met Pt will Ambulate: >150 feet;with supervision;with rolling walker PT Goal: Ambulate - Progress: Met Pt will Go Up / Down Stairs: Flight;with modified independence;with rail(s) PT Goal: Up/Down Stairs - Progress: Goal set today  PT Treatment Precautions/Restrictions  Precautions Precautions: Fall Required Braces or Orthoses: No Restrictions Weight Bearing Restrictions: No Mobility (including Balance) Bed Mobility Bed Mobility: No Transfers Transfers: Yes Sit to Stand: 7: Independent;With upper extremity assist;With armrests;From chair/3-in-1 Sit to Stand Details (indicate cue type and reason): Patient able to describe and demonstrate appropriate hand placement with RW. Stand to Sit: 7: Independent;With upper extremity assist;To chair/3-in-1 Stand to Sit Details: Patient able to describe and demonstrate appropriate hand placement with RW. Ambulation/Gait Ambulation/Gait: Yes Ambulation/Gait Assistance: 5: Supervision Ambulation/Gait Assistance Details (indicate cue type and reason): Pt requires supervision for safety. Ambulation Distance (Feet):  200 Feet Assistive device: Rolling walker Gait Pattern: Decreased stride length;Decreased step length - left;Decreased hip/knee flexion - left Gait velocity: Decreased Stairs: Yes Stairs Assistance: 5: Supervision Stairs Assistance Details (indicate cue type and reason): Pt required VC for which foot to lead with while ascending and descending Stair Management Technique: Two rails;Step to pattern;Forwards Number of Stairs: 10  (Ascended and descended 10 steps) Wheelchair Mobility Wheelchair Mobility: No  Posture/Postural Control Posture/Postural Control: No significant limitations Balance Balance Assessed: Yes Static Standing Balance Static Standing - Balance Support: No upper extremity supported Static Standing - Level of Assistance: 5: Stand by assistance Static Standing - Comment/# of Minutes: 10 minutes while talking to physician Exercise    End of Session PT - End of Session Equipment Utilized During Treatment: Gait belt Activity Tolerance: Patient tolerated treatment well Patient left: in chair;with call bell in reach;with family/visitor present General Behavior During Session: [facility] for tasks performed Cognition: [facility] for tasks performed  Ezzard Standing SPT 05/23/2011, 12:38 PM

## 2011-05-23 NOTE — Progress Notes (Signed)
INR today 1.96 - must be < 1.7 to perform angiogram - diet has been resumed for today, then NPO after MN in anticipation of INR being within this range for angio to be performed 05/24/11.

## 2011-05-23 NOTE — Progress Notes (Signed)
. History: [name] is an 77 y.o. female with a history of previous stroke, right middle cerebral artery stent placement, hypertension, diabetes mellitus and recently diagnosed atrial fibrillation presenting with acute onset of left arm and leg weakness at 10 AM today. Patient has improved significantly since the onset of weakness. She has been on Coumadin for 10-11 days. INR today is 1.4. CT scan of her head showed right MCA stent with no acute intracranial abnormalities. NIH score was 2. Patient was not a candidate for IV thrombolytic therapy with TPA because of rapidly resolving deficits. Patient is also status post pacemaker placement for bradycardia.   LSN: 1000 today  tPA Given: No: Rapidly resolving deficits  MRankin: 0  Subjective: Stable no complaints. Angio on hold as INR 1.9 today.family wants cerebral angiogram and rt mca pta if necessary.  Objective: BP 118/75  Pulse 64  Temp(Src) 97.9 F (36.6 C) (Oral)  Resp 18  Ht 5\' 6"  (1.676 m)  Wt 99.338 kg (219 lb)  BMI 35.35 kg/m2  SpO2 94% Telemetry:  CBGs  Basename 05/23/11 0643 05/22/11 2116 05/22/11 1638 05/22/11 1129 05/22/11 0700 05/21/11 2152 05/21/11 1656 05/21/11 0634 05/20/11 2144 05/20/11 1152  GLUCAP 92 77 171* 140* 122* 112* 170* 94 155* 199*   Diet: CHO, thin Activity: up with assistance DVT Prophylaxis: warfarin on hold for angio  Medications: Scheduled:   . aspirin EC  81 mg Oral QPC breakfast  . calcium carbonate  1 tablet Oral Daily  . cholecalciferol  2,000 Units Oral Daily  . citalopram  10 mg Oral Daily  . clopidogrel  75 mg Oral Q breakfast  . vitamin B-12  2,500 mcg Oral Daily  . glipiZIDE  10 mg Oral BID AC  . letrozole  2.5 mg Oral Daily  . lisinopril  10 mg Oral QHS  . magnesium oxide  400 mg Oral Daily  . omega-3 acid ethyl esters  2 g Oral Daily  . pravastatin  20 mg Oral q1800  . propranolol  20 mg Oral BID  . Warfarin - Pharmacist Dosing Inpatient   Does not apply q1800  .  DISCONTD: warfarin  7.5 mg Oral ONCE-1800   Neurologic Exam: Mental Status: Alert, oriented, thought content appropriate.  Speech fluent , but slowed in outout.  No evidence of aphasia. Mild dysarthria persists.  Able to follow 3 step commands without difficulty.  Attentive and conversant. Cranial Nerves: II- Visual fields grossly intact. III/IV/VI-Extraocular movements intact.  Pupils reactive bilaterally. V/VII-flattened L NLF VIII-hearing grossly intact IX/X-normal gag XI-bilateral shoulder shrug XII-midline tongue extension Motor: 5/5 bilaterally with normal tone and bulk, left grip slightly weaker than right.   Sensory: Light touch intact throughout, bilaterally.  Discrimination intact. Deep Tendon Reflexes: 1+ and symmetric throughout, except 0 @ achilles Plantars: Downgoing bilaterally Cerebellar: Minimal dysmetria of LUE/ LLE on finger to nose, heel to shin, respectively.  Orbits left hand.  Lab Results: Basic Metabolic Panel:  Lab 05/23/11 4098 05/22/11  NA 135 132*  K 3.9 4.4  CL 100 96  CO2 28 28  GLUCOSE 94 111*  BUN 20 20  CREATININE 0.85 1.06  CALCIUM 9.4 9.6  MG -- --  PHOS -- --   Liver Function Tests:  Lab 05/20/11 1247  AST 20  ALT 12  ALKPHOS 52  BILITOT 0.3  PROT 6.4  ALBUMIN 3.3*   CBC:  Lab 05/23/11 0512 05/20/11 1257 05/20/11 1247  WBC 4.4 -- 6.8  NEUTROABS -- -- 4.7  HGB  11.6* 12.2 --  HCT 33.9* 36.0 --  MCV 90.4 -- 91.9  PLT 188 -- 205   Cardiac Enzymes:  Lab 05/20/11 1247  CKTOTAL 85  CKMB 3.1  CKMBINDEX --  TROPONINI <0.30   CBG:  Lab 05/23/11 0643 05/22/11 2116 05/22/11 1638 05/22/11 1129 05/22/11 0700 05/21/11 2152  GLUCAP 92 77 171* 140* 122* 112*   Fasting Lipid Panel:  Lab 05/21/11 0615  CHOL 226*  HDL 38*  LDLCALC 152*  TRIG 182*  CHOLHDL 5.9  LDLDIRECT --   Coagulation:  Lab 05/23/11 0512 05/22/11 0608 05/21/11 0615 05/20/11 1247  LABPROT 22.7* 23.2* 21.2* 17.4*  INR 1.96* 2.02* 1.80* 1.40   Study  Results:  05/21/2011  CT ANGIOGRAPHY NECK  Findings:  Poor contrast bolus leads to suboptimal opacification of the extracranial intracranial vasculature.  Tortuous but nonstenotic innominate and right common carotid artery.  Tortuous but nonstenotic left common carotid artery. Nonstenotic calcific plaque origin left subclavian.   Moderate stenosis right vertebral origin.  Nonstenotic calcific plaque left vertebral origin.  No significant plaque left carotid bifurcation.  Right internal carotid artery widely patent.  Moderate calcific plaque right external carotid origin.   Both vertebrals widely patent in their cervical segments.  No neck masses.  Airway midline.  Moderate spondylosis.  Mild scarring lung apices.  Pacemaker.   Review of the MIP images confirms the above findings.  IMPRESSION: No significant extracranial internal carotid vascular disease. Moderate stenosis right vertebral origin and right external carotid origin.    05/21/2011  CT ANGIOGRAPHY HEAD Findings:  Widely patent internal carotid arteries in the skull base and supraclinoid segments.  Right MCA stent does not show significant intrastent contrast accumulation.  Severe intimal hyperplasia not excluded.  Diminished intensity of the M2 and M3 vessel enhancement on the right as compared to the left.  No visible left MCA stenosis.  Anterior and posterior cerebral arteries are widely patent.  Basilar artery widely patent with vertebrals codominant.  Mild atrophy.  Chronic microvascular ischemic change.  No acute large vessel infarct.  Unremarkable orbits, sinuses, and mastoids. Calvarium intact.   Review of the MIP images confirms the above findings.  IMPRESSION: Concern raised for significant right M1 MCA intrastent stenosis, likely intimal hyperplasia.  Recommend formal catheter angiogram.  [name], M.D.   05/20/2011  CXR Findings: Cardiomegaly.  Left chest wall battery pack with dual leads, tips projecting over the right atrium and  ventricle. Prominent mediastinal contours, similar to prior.  Mild left lung base opacity and mild left hemidiaphragm elevation.  No pleural effusion or pneumothorax.  No acute osseous abnormality.  IMPRESSION: Stable prominent cardiomediastinal contours.  Mild left lung base scarring versus atelectasis.   Waneta Martins, M.D.   05/20/2011   CT HEAD WITHOUT CONTRAST   Findings: Stent within right middle cerebral artery (M1 segment) unchanged compared to the prior examination.  Mild cerebral and cerebellar atrophy with mild chronic microvascular ischemic changes in the periventricular white matter of the cerebral hemispheres bilaterally.  No definite acute intracranial abnormality. Specifically, no definite signs of acute/subacute ischemia, no focal mass, mass effect, hydrocephalus or abnormal intra or extra- axial fluid collections.  Visualized paranasal sinuses and mastoids are well pneumatized.  No displaced skull fractures are noted.  IMPRESSION: 1.  No acute intracranial abnormalities. 2.  Mild cerebral and cerebellar atrophy with mild chronic microvascular ischemic changes in the white matter, as above.  Florencia Reasons, M.D.   2D ECHO EF 55-60% with no source  of embolus.   Carotid Dopplers: No internal carotid artery stenosis bilaterally. Vertebrals with antegrade flow bilaterally.   Therapies: home health PT recommended  Assessment/Plan: 77 y.o. female with resolving acute weakness of the left arm and leg, and to a lesser extent left lower face. Rt hemispheric  TIA is likely. However small subcortical right MCA territory stroke cannot be ruled out. Has chronic Afib  2. Atrial fibrillation subtherapeutic warfarin on admission  (INR 1.4)start heparin drip as warafrin on hold for angiogram .hold heparin 1 hr prior to angiogram. 3. Status post right MCA stent placement 2011, ? Intra stent stenosis 4. History of previous right CVA.  5. Hyperlipidemia- not at goal <80, LDL 152 6. Diabetes-  A1C 6.9 7. Hypertension- controlled 8. PTVPM- bradycardia 9. H/O breast CA s/p mastectomy 2008.  On Femara.  Stroke Risk Factors - atrial fibrillation, diabetes mellitus and hypertension   -angio planned by Deveshwar. INR to high to proceed today. Coumadin on hold. Pt on ASA and plavix  Plan Cerebral angio once INR < 1.7    LOS: 3 days   SHARON BIBY, AVNP, ANP-BC, GNP-BC [facility] Pager: [phone] 05/23/2011 8:54 AM  Dr. Delia Heady, Stroke Center Medical Director, has personally reviewed chart, pertinent data, examined the patient and developed the plan of care.

## 2011-05-23 NOTE — Progress Notes (Signed)
Subjective: No fever; no CP or SOB. Reports increase strength on her right side.    Objective: Vital signs in last 24 hours: Temp:  [97.6 F (36.4 C)-98.5 F (36.9 C)] 97.6 F (36.4 C) (03/13 1000) Pulse Rate:  [60-64] 62  (03/13 1000) Resp:  [17-20] 20  (03/13 1000) BP: (109-161)/(65-88) 114/67 mmHg (03/13 1000) SpO2:  [92 %-95 %] 95 % (03/13 1000) Weight change:  Last BM Date: 05/18/11  Intake/Output from previous day:       Physical Exam: General: Alert, awake, oriented x3, in no acute distress. HEENT: No bruits, no goiter. Heart: S1 and S2;  No murmurs, no rubs, no gallops. Lungs: Clear to auscultation bilaterally. Abdomen: Soft, nontender, nondistended, positive bowel sounds. Extremities: No clubbing, cyanosis or edema with positive pedal pulses. Neuro: nonfocal.    Lab Results: Basic Metabolic Panel:  Basename 05/23/11 0512 05/22/11  NA 135 132*  K 3.9 4.4  CL 100 96  CO2 28 28  GLUCOSE 94 111*  BUN 20 20  CREATININE 0.85 1.06  CALCIUM 9.4 9.6  MG -- --  PHOS -- --   Liver Function Tests:  Basename 05/20/11 1247  AST 20  ALT 12  ALKPHOS 52  BILITOT 0.3  PROT 6.4  ALBUMIN 3.3*   CBC:  Basename 05/23/11 0512 05/20/11 1257 05/20/11 1247  WBC 4.4 -- 6.8  NEUTROABS -- -- 4.7  HGB 11.6* 12.2 --  HCT 33.9* 36.0 --  MCV 90.4 -- 91.9  PLT 188 -- 205   Cardiac Enzymes:  Basename 05/20/11 1247  CKTOTAL 85  CKMB 3.1  CKMBINDEX --  TROPONINI <0.30   CBG:  Basename 05/23/11 0643 05/22/11 2116 05/22/11 1638 05/22/11 1129 05/22/11 0700 05/21/11 2152  GLUCAP 92 77 171* 140* 122* 112*   Hemoglobin A1C:  Basename 05/21/11 0615  HGBA1C 6.9*   Fasting Lipid Panel:  Basename 05/21/11 0615  CHOL 226*  HDL 38*  LDLCALC 152*  TRIG 182*  CHOLHDL 5.9  LDLDIRECT --   Coagulation:  Basename 05/23/11 0512 05/22/11 0608  LABPROT 22.7* 23.2*  INR 1.96* 2.02*    Studies/Results: No results found.   Medications: Scheduled Meds:   .  aspirin EC  81 mg Oral QPC breakfast  . calcium carbonate  1 tablet Oral Daily  . cholecalciferol  2,000 Units Oral Daily  . citalopram  10 mg Oral Daily  . clopidogrel  75 mg Oral Q breakfast  . vitamin B-12  2,500 mcg Oral Daily  . glipiZIDE  10 mg Oral BID AC  . letrozole  2.5 mg Oral Daily  . lisinopril  10 mg Oral QHS  . magnesium oxide  400 mg Oral Daily  . omega-3 acid ethyl esters  2 g Oral Daily  . pravastatin  20 mg Oral q1800  . propranolol  20 mg Oral BID  . Warfarin - Pharmacist Dosing Inpatient   Does not apply q1800  . DISCONTD: warfarin  7.5 mg Oral ONCE-1800   Continuous Infusions:   . sodium chloride     PRN Meds:.ondansetron (ZOFRAN) IV, senna-docusate  Assessment/Plan: 1-TIA: right side weakness improving. Per neurology recommendations; will perform angiogram to evaluate MCA stent patency. Will hold coumadin; start heparin drip and follow results and recommendations after study completed.  2. Atrial fibrillation on warfarin: on admission subtherapeutic; will now use heparin drip as warafrin on hold for angiogram. Will hold heparin 1 hr prior to angiogram. Plan is to resume coumadin once study is completed.  3. Status post  right MCA stent placement 2011: ? Intra stent stenosis; will follow angiogram.  4. History of previous right CVA.: no significant deficit; will continue risk factors modifications.  5. Hyperlipidemia- Continue statins.    6. Diabetes- A1C 6.9; continue current therapy.  7. Hypertension- controlled; no changes to her medications needed at this point.  8. PTVPM- bradycardia; well controlled now.   9. H/O breast CA s/p mastectomy 2008. On Femara.      LOS: 3 days   [name] [facility] [phone]  05/23/2011, 11:26 AM

## 2011-05-23 NOTE — Progress Notes (Signed)
Heparin per Pharmacy  8 hr heparin level =0.19 on 900 units/hr Goal heparin level=0.3-0.5  Heparin level is subtherapeutic. Will increase rate to 1000 units/hr and f/u heparin level and CBC in AM.

## 2011-05-23 NOTE — Progress Notes (Signed)
Agree with SPT note.  Jerell Demery, PT DPT [phone]  

## 2011-05-24 ENCOUNTER — Encounter (HOSPITAL_COMMUNITY): Payer: Self-pay | Admitting: Neurology

## 2011-05-24 ENCOUNTER — Inpatient Hospital Stay (HOSPITAL_COMMUNITY): Payer: Medicare Other

## 2011-05-24 LAB — GLUCOSE, CAPILLARY: Glucose-Capillary: 130 mg/dL — ABNORMAL HIGH (ref 70–99)

## 2011-05-24 LAB — CBC
HCT: 34.1 % — ABNORMAL LOW (ref 36.0–46.0)
Hemoglobin: 12 g/dL (ref 12.0–15.0)
MCH: 31.6 pg (ref 26.0–34.0)
MCHC: 35.2 g/dL (ref 30.0–36.0)
MCV: 89.7 fL (ref 78.0–100.0)

## 2011-05-24 MED ORDER — HEPARIN SOD (PORK) LOCK FLUSH 100 UNIT/ML IV SOLN
500.0000 [IU] | Freq: Once | INTRAVENOUS | Status: AC
  Administered 2011-05-24: 500 [IU] via INTRAVENOUS

## 2011-05-24 MED ORDER — MIDAZOLAM HCL 5 MG/5ML IJ SOLN
INTRAMUSCULAR | Status: DC | PRN
  Administered 2011-05-24 (×2): 1 mg via INTRAVENOUS

## 2011-05-24 MED ORDER — WARFARIN SODIUM 7.5 MG PO TABS
7.5000 mg | ORAL_TABLET | ORAL | Status: AC
  Administered 2011-05-24: 7.5 mg via ORAL
  Filled 2011-05-24: qty 1

## 2011-05-24 MED ORDER — SODIUM CHLORIDE 0.9 % IV SOLN
INTRAVENOUS | Status: DC | PRN
  Administered 2011-05-24: 75 mL/h via INTRAVENOUS

## 2011-05-24 MED ORDER — SODIUM CHLORIDE 0.9 % IV SOLN
INTRAVENOUS | Status: AC
  Administered 2011-05-24: 22:00:00 via INTRAVENOUS

## 2011-05-24 MED ORDER — HEPARIN (PORCINE) IN NACL 100-0.45 UNIT/ML-% IJ SOLN
1200.0000 [IU]/h | INTRAMUSCULAR | Status: DC
  Administered 2011-05-24: 1100 [IU]/h via INTRAVENOUS
  Filled 2011-05-24 (×3): qty 250

## 2011-05-24 MED ORDER — FENTANYL CITRATE 0.05 MG/ML IJ SOLN
INTRAMUSCULAR | Status: DC | PRN
  Administered 2011-05-24: 12.5 ug via INTRAVENOUS
  Administered 2011-05-24: 25 ug via INTRAVENOUS

## 2011-05-24 MED ORDER — IOHEXOL 300 MG/ML  SOLN
150.0000 mL | Freq: Once | INTRAMUSCULAR | Status: AC | PRN
  Administered 2011-05-24: 72 mL via INTRA_ARTERIAL

## 2011-05-24 NOTE — Progress Notes (Signed)
History: [name] is an 77 y.o. female with a history of previous stroke, right middle cerebral artery stent placement, hypertension, diabetes mellitus and recently diagnosed atrial fibrillation presenting with acute onset of left arm and leg weakness at 10 AM 05/20/11. Patient has improved significantly since the onset of weakness. She has been on Coumadin for 10-11 days. INR today is 1.4. CT scan of her head showed right MCA stent with no acute intracranial abnormalities. NIH score was 2. Patient was not a candidate for IV thrombolytic therapy with TPA because of rapidly resolving deficits. Patient is also status post pacemaker placement for bradycardia.   Subjective: She remains stable. Cerebral catheter angio pending today to decide on RMCA stent patency. Objective: BP 119/73  Pulse 60  Temp(Src) 97.5 F (36.4 C) (Oral)  Resp 18  Ht 5\' 6"  (1.676 m)  Wt 99.338 kg (219 lb)  BMI 35.35 kg/m2  SpO2 94% Telemetry:  CBGs  Basename 05/24/11 0652 05/23/11 2203 05/23/11 1635 05/23/11 1140 05/23/11 0643 05/22/11 2116 05/22/11 1638 05/22/11 1129 05/22/11 0700 05/21/11 2152  GLUCAP 86 130* 84 211* 92 77 171* 140* 122* 112*   Diet: CHO, thin Activity: up with assistance DVT Prophylaxis: warfarin on hold for angio  Medications: Scheduled:   . aspirin EC  81 mg Oral QPC breakfast  . calcium carbonate  1 tablet Oral Daily  . cholecalciferol  2,000 Units Oral Daily  . citalopram  10 mg Oral Daily  . clopidogrel  75 mg Oral Q breakfast  . vitamin B-12  2,500 mcg Oral Daily  . glipiZIDE  10 mg Oral BID AC  . letrozole  2.5 mg Oral Daily  . lisinopril  10 mg Oral QHS  . magnesium oxide  400 mg Oral Daily  . omega-3 acid ethyl esters  2 g Oral Daily  . pravastatin  20 mg Oral q1800  . propranolol  20 mg Oral BID  . Warfarin - Pharmacist Dosing Inpatient   Does not apply q1800   Neurologic Exam: Mental Status: Alert, oriented, thought content appropriate.  Speech fluent , but slowed in  outout.  No evidence of aphasia. Mild dysarthria persists.  Able to follow 3 step commands without difficulty.  Attentive and conversant. Cranial Nerves: II- Visual fields grossly intact. III/IV/VI-Extraocular movements intact.  Pupils reactive bilaterally. V/VII-flattened L NLF VIII-hearing grossly intact IX/X-normal gag XI-bilateral shoulder shrug XII-midline tongue extension Motor: 5/5 bilaterally with normal tone and bulk, left grip slightly weaker than right.   Sensory: Light touch intact throughout, bilaterally.  Discrimination intact. Deep Tendon Reflexes: 1+ and symmetric throughout, except 0 @ achilles Plantars: Downgoing bilaterally Cerebellar: Minimal dysmetria of LUE/ LLE on finger to nose, heel to shin, respectively.  Orbits left hand.  Lab Results: Basic Metabolic Panel:  Lab 05/23/11 1610 05/22/11  NA 135 132*  K 3.9 4.4  CL 100 96  CO2 28 28  GLUCOSE 94 111*  BUN 20 20  CREATININE 0.85 1.06  CALCIUM 9.4 9.6  MG -- --  PHOS -- --   Liver Function Tests:  Lab 05/20/11 1247  AST 20  ALT 12  ALKPHOS 52  BILITOT 0.3  PROT 6.4  ALBUMIN 3.3*   CBC:  Lab 05/24/11 0749 05/23/11 0512 05/20/11 1247  WBC 4.1 4.4 --  NEUTROABS -- -- 4.7  HGB 12.0 11.6* --  HCT 34.1* 33.9* --  MCV 89.7 90.4 --  PLT 199 188 --   Cardiac Enzymes:  Lab 05/20/11 1247  CKTOTAL 85  CKMB 3.1  CKMBINDEX --  TROPONINI <0.30   CBG:  Lab 05/24/11 0652 05/23/11 2203 05/23/11 1635 05/23/11 1140 05/23/11 0643 05/22/11 2116  GLUCAP 86 130* 84 211* 92 77   Fasting Lipid Panel:  Lab 05/21/11 0615  CHOL 226*  HDL 38*  LDLCALC 152*  TRIG 182*  CHOLHDL 5.9  LDLDIRECT --   Coagulation:  Lab 05/23/11 0512 05/22/11 0608 05/21/11 0615 05/20/11 1247  LABPROT 22.7* 23.2* 21.2* 17.4*  INR 1.96* 2.02* 1.80* 1.40   Study Results:  05/21/2011  CT ANGIOGRAPHY NECK  Findings:  Poor contrast bolus leads to suboptimal opacification of the extracranial intracranial vasculature.   Tortuous but nonstenotic innominate and right common carotid artery.  Tortuous but nonstenotic left common carotid artery. Nonstenotic calcific plaque origin left subclavian.   Moderate stenosis right vertebral origin.  Nonstenotic calcific plaque left vertebral origin.  No significant plaque left carotid bifurcation.  Right internal carotid artery widely patent.  Moderate calcific plaque right external carotid origin.   Both vertebrals widely patent in their cervical segments.  No neck masses.  Airway midline.  Moderate spondylosis.  Mild scarring lung apices.  Pacemaker.   Review of the MIP images confirms the above findings.  IMPRESSION: No significant extracranial internal carotid vascular disease. Moderate stenosis right vertebral origin and right external carotid origin.    05/21/2011  CT ANGIOGRAPHY HEAD Findings:  Widely patent internal carotid arteries in the skull base and supraclinoid segments.  Right MCA stent does not show significant intrastent contrast accumulation.  Severe intimal hyperplasia not excluded.  Diminished intensity of the M2 and M3 vessel enhancement on the right as compared to the left.  No visible left MCA stenosis.  Anterior and posterior cerebral arteries are widely patent.  Basilar artery widely patent with vertebrals codominant.  Mild atrophy.  Chronic microvascular ischemic change.  No acute large vessel infarct.  Unremarkable orbits, sinuses, and mastoids. Calvarium intact.   Review of the MIP images confirms the above findings.  IMPRESSION: Concern raised for significant right M1 MCA intrastent stenosis, likely intimal hyperplasia.  Recommend formal catheter angiogram.  [name], M.D.   05/20/2011  CXR Findings: Cardiomegaly.  Left chest wall battery pack with dual leads, tips projecting over the right atrium and ventricle. Prominent mediastinal contours, similar to prior.  Mild left lung base opacity and mild left hemidiaphragm elevation.  No pleural effusion or  pneumothorax.  No acute osseous abnormality.  IMPRESSION: Stable prominent cardiomediastinal contours.  Mild left lung base scarring versus atelectasis.   Waneta Martins, M.D.   05/20/2011   CT HEAD WITHOUT CONTRAST   Findings: Stent within right middle cerebral artery (M1 segment) unchanged compared to the prior examination.  Mild cerebral and cerebellar atrophy with mild chronic microvascular ischemic changes in the periventricular white matter of the cerebral hemispheres bilaterally.  No definite acute intracranial abnormality. Specifically, no definite signs of acute/subacute ischemia, no focal mass, mass effect, hydrocephalus or abnormal intra or extra- axial fluid collections.  Visualized paranasal sinuses and mastoids are well pneumatized.  No displaced skull fractures are noted.  IMPRESSION: 1.  No acute intracranial abnormalities. 2.  Mild cerebral and cerebellar atrophy with mild chronic microvascular ischemic changes in the white matter, as above.  Florencia Reasons, M.D.   2D ECHO EF 55-60% with no source of embolus.   Carotid Dopplers: No internal carotid artery stenosis bilaterally. Vertebrals with antegrade flow bilaterally.   Therapies: home health PT recommended  Assessment/Plan: 77 y.o. female with  resolving acute weakness of the left arm and leg, and to a lesser extent left lower face. Rt hemispheric  TIA is likely. However small subcortical right MCA territory stroke cannot be ruled out. Has chronic Afib. Pt currently on ASA and Plavix, IV heparin  2. Atrial fibrillation subtherapeutic warfarin on admission  (INR 1.4) start heparin drip as warafrin on hold for angiogram .hold heparin 1 hr prior to angiogram. 3. Status post right MCA stent placement 2011, ? Intra stent stenosis. Angio to further eval 4. History of previous right CVA.  5. Hyperlipidemia- not at goal <80, LDL 152 6. Diabetes- A1C 6.9 7. Hypertension- controlled 8. PTVPM- bradycardia 9. H/O breast CA s/p  mastectomy 2008.  On Femara.  Stroke Risk Factors - atrial fibrillation, diabetes mellitus and hypertension   Plan todays INR pending. Cerebral angio once INR < 1.7. Pt is NPO in anticipation of procedure.D/W patient and daughter.    LOS: 4 days   05/24/2011 8:44 AM

## 2011-05-24 NOTE — Progress Notes (Signed)
Subjective: No fever; no CP or SOB. Feeling better. Improved articulation. INR 1.96  Objective: Vital signs in last 24 hours: Temp:  [96.9 F (36.1 C)-98.3 F (36.8 C)] 97.9 F (36.6 C) (03/14 2145) Pulse Rate:  [60-76] 74  (03/14 2145) Resp:  [12-20] 20  (03/14 2145) BP: (115-159)/(50-85) 146/83 mmHg (03/14 2145) SpO2:  [91 %-100 %] 95 % (03/14 2145) Weight change:  Last BM Date: 05/23/11  Intake/Output from previous day: 03/13 0701 - 03/14 0700 In: 840 [P.O.:240; I.V.:600] Out: -      Physical Exam: General: Alert, awake, oriented x3, in no acute distress. HEENT: No bruits, no goiter. Heart: S1 and S2;  No murmurs, no rubs, no gallops. Lungs: Clear to auscultation bilaterally. Abdomen: Soft, nontender, nondistended, positive bowel sounds. Extremities: No clubbing, cyanosis or edema with positive pedal pulses. Neuro: no new deficit; patient speech improved; increased strength and balance.   Lab Results: Basic Metabolic Panel:  Basename 05/23/11 0512 05/22/11  NA 135 132*  K 3.9 4.4  CL 100 96  CO2 28 28  GLUCOSE 94 111*  BUN 20 20  CREATININE 0.85 1.06  CALCIUM 9.4 9.6  MG -- --  PHOS -- --   CBC:  Basename 05/24/11 0749 05/23/11 0512  WBC 4.1 4.4  NEUTROABS -- --  HGB 12.0 11.6*  HCT 34.1* 33.9*  MCV 89.7 90.4  PLT 199 188   CBG:  Basename 05/24/11 1652 05/24/11 1118 05/24/11 0652 05/23/11 2203 05/23/11 1635 05/23/11 1140  GLUCAP 101* 102* 86 130* 84 211*   Coagulation:  Basename 05/24/11 0749 05/23/11 0512  LABPROT 17.8* 22.7*  INR 1.44 1.96*    Studies/Results: No results found.   Medications: Scheduled Meds:    . aspirin EC  81 mg Oral QPC breakfast  . calcium carbonate  1 tablet Oral Daily  . cholecalciferol  2,000 Units Oral Daily  . citalopram  10 mg Oral Daily  . clopidogrel  75 mg Oral Q breakfast  . vitamin B-12  2,500 mcg Oral Daily  . glipiZIDE  10 mg Oral BID AC  . heparin lock flush  500 Units Intravenous Once  .  heparin lock flush  500 Units Intravenous Once  . letrozole  2.5 mg Oral Daily  . lisinopril  10 mg Oral QHS  . magnesium oxide  400 mg Oral Daily  . omega-3 acid ethyl esters  2 g Oral Daily  . pravastatin  20 mg Oral q1800  . propranolol  20 mg Oral BID  . warfarin  7.5 mg Oral NOW  . Warfarin - Pharmacist Dosing Inpatient   Does not apply q1800   Continuous Infusions:    . sodium chloride 50 mL/hr at 05/24/11 0701  . sodium chloride 75 mL/hr (05/24/11 1241)  . sodium chloride 75 mL/hr at 05/24/11 2144  . heparin 1,100 Units/hr (05/24/11 2237)  . DISCONTD: heparin 12 Units/kg/hr (05/23/11 1415)  . DISCONTD: heparin 1,000 Units/hr (05/23/11 2258)   PRN Meds:.sodium chloride, fentaNYL, iohexol, midazolam, ondansetron (ZOFRAN) IV, senna-docusate  Assessment/Plan: 1-TIA: right side weakness improving. Per neurology recommendations; will perform angiogram to evaluate MCA stent patency. Will hold coumadin; start heparin drip and follow results and recommendations after study completed. INR 1.96  2. Atrial fibrillation on warfarin: on admission subtherapeutic; will now use heparin drip as warafrin on hold for angiogram. Will hold heparin 1 hr prior to angiogram. Plan is to resume coumadin once study is completed. Meanwhile patient also on ASA and plavix.  3. Status post  right MCA stent placement 2011: ? Intra stent stenosis; will follow angiogram. Schedule to be done today.  4. History of previous right CVA.: no significant deficit; will continue risk factors modifications.  5. Hyperlipidemia- Continue statins.    6. Diabetes- A1C 6.9; continue current therapy.  7. Hypertension- controlled; no changes to her medications needed at this point.  8. PTVPM- bradycardia; well controlled now.   9. H/O breast CA s/p mastectomy 2008. On Femara.      LOS: 4 days   [name] [facility] [phone]  05/24/2011, 10:50 PM

## 2011-05-24 NOTE — Procedures (Signed)
S/p bilateral common carotid arteriograms Rt cfa approach  Preliminary findings  1. 80- 85 % rt MCA intrastent stenosis

## 2011-05-24 NOTE — Progress Notes (Signed)
ANTICOAGULATION CONSULT NOTE - Follow Up Consult  Pharmacy Consult for Heparin and Coumadin Indication: atrial fibrillation and acute stroke  Allergies  Allergen Reactions  . Oxycontin Nausea And Vomiting  . Crestor (Rosuvastatin Calcium) Other (See Comments)    dizziness  . Statins     Cramping in legs  . Zetia (Ezetimibe) Other (See Comments)    dizziness    Patient Measurements: Height: 5\' 6"  (167.6 cm) Weight: 219 lb (99.338 kg) IBW/kg (Calculated) : 59.3  Heparin Dosing Weight: 81 kg  Vital Signs: Temp: 97.9 F (36.6 C) (03/14 2145) Temp src: Oral (03/14 2145) BP: 146/83 mmHg (03/14 2145) Pulse Rate: 74  (03/14 2145)  Labs:  Alvira Philips 05/24/11 0749 05/23/11 2136 05/23/11 0512 05/22/11 0608 05/22/11  HGB 12.0 -- 11.6* -- --  HCT 34.1* -- 33.9* -- --  PLT 199 -- 188 -- --  APTT -- -- 56* -- --  LABPROT 17.8* -- 22.7* 23.2* --  INR 1.44 -- 1.96* 2.02* --  HEPARINUNFRC 0.27* 0.19* -- -- --  CREATININE -- -- 0.85 -- 1.06  CKTOTAL -- -- -- -- --  CKMB -- -- -- -- --  TROPONINI -- -- -- -- --   Estimated Creatinine Clearance: 69 ml/min (by C-G formula based on Cr of 0.85).   Medications:  Scheduled:    . aspirin EC  81 mg Oral QPC breakfast  . calcium carbonate  1 tablet Oral Daily  . cholecalciferol  2,000 Units Oral Daily  . citalopram  10 mg Oral Daily  . clopidogrel  75 mg Oral Q breakfast  . vitamin B-12  2,500 mcg Oral Daily  . glipiZIDE  10 mg Oral BID AC  . heparin lock flush  500 Units Intravenous Once  . heparin lock flush  500 Units Intravenous Once  . letrozole  2.5 mg Oral Daily  . lisinopril  10 mg Oral QHS  . magnesium oxide  400 mg Oral Daily  . omega-3 acid ethyl esters  2 g Oral Daily  . pravastatin  20 mg Oral q1800  . propranolol  20 mg Oral BID  . warfarin  7.5 mg Oral NOW  . Warfarin - Pharmacist Dosing Inpatient   Does not apply q1800   Infusions:    . sodium chloride 50 mL/hr at 05/24/11 0701  . sodium chloride 75 mL/hr  (05/24/11 1241)  . sodium chloride 75 mL/hr at 05/24/11 2144  . heparin 1,100 Units/hr (05/24/11 2237)  . DISCONTD: heparin 12 Units/kg/hr (05/23/11 1415)  . DISCONTD: heparin 1,000 Units/hr (05/23/11 2258)    Assessment: 77 yo F on Coumadin PTA for Afib, admitted with acute TIA symptoms.  Coumadin has been held allowing INR to trend down so that patient could have an angiogram performed.  Angiogram completed today.  To restart Heparin and Coumadin post-procedure.  Goal of Therapy:  INR 2-3 Heparin level 0.3-0.5   Plan:  Restart heparin at previous rate of 1100 units/hr. Give Coumadin 7.5mg  PO x 1 tonight. Follow-up with AM heparin level, CBC, and INR.  [employer], Pharm.D., BCPS Clinical Pharmacist Pager [phone]  05/24/2011,10:38 PM

## 2011-05-24 NOTE — Progress Notes (Signed)
Physical Therapy Treatment Patient Details Name: [name] MRN: [id number] DOB: [birth year] Today's Date: 05/24/2011  PT Assessment/Plan  PT - Assessment/Plan Comments on Treatment Session: Pt demonstrates significantly improved balance today, scoring a 44/56 on the Berg Balance Test.  This makes her a minimal fall risk patient.  Patient able to describe and demonstrate sequencing for hand placement with RW for sit to stand.  Possibly evaluate patient's ability to use a cane on next PT visit.  Pt has met most goals but needs to perform stair negotiation. PT Plan: Discharge plan remains appropriate PT Frequency: Min 4X/week Follow Up Recommendations: Outpatient PT Equipment Recommended: None recommended by PT PT Goals  Acute Rehab PT Goals PT Goal Formulation: With patient Time For Goal Achievement: 7 days Pt will go Supine/Side to Sit: Independently PT Goal: Supine/Side to Sit - Progress: Met Additional Goals Additional Goal #1: Patient will increase Berg Balance Score by 5 points in order to demonstrate improved dynamic balance. PT Goal: Additional Goal #1 - Progress: Met  PT Treatment Precautions/Restrictions  Precautions Precautions: Fall Required Braces or Orthoses: No Restrictions Weight Bearing Restrictions: No Mobility (including Balance) Bed Mobility Bed Mobility: Yes Supine to Sit: 7: Independent;HOB flat Transfers Transfers: Yes Sit to Stand: 7: Independent;From bed;From chair/3-in-1;From toilet Sit to Stand Details (indicate cue type and reason): Patient able to perform sit to stand transfer without UE assist from bed, chair, and toilet. Stand to Sit: 7: Independent;To bed;To chair/3-in-1;To toilet Stand to Sit Details: Patient able to describe and demonstrate appropriate hand placement with RW.  Patient able to tranfer from stand to sit without UE assist to bed, chair, and toilet. Ambulation/Gait Ambulation/Gait: Yes Ambulation/Gait Assistance: Other (comment)  (Min guard) Ambulation/Gait Assistance Details (indicate cue type and reason): Patient requires min guard for safety. Ambulation Distance (Feet): 40 Feet Assistive device: Rolling walker Gait Pattern: Decreased stride length;Decreased step length - left Gait velocity: Decreased Stairs: No Wheelchair Mobility Wheelchair Mobility: No  Posture/Postural Control Posture/Postural Control: Postural limitations Postural Limitations: Patient has thoracic kyphosis. Balance Balance Assessed: Yes Static Standing Balance Static Standing - Balance Support: No upper extremity supported;During functional activity Static Standing - Level of Assistance: 5: Stand by assistance Static Standing - Comment/# of Minutes: 5 minutes Berg Balance Test Sit to Stand: Able to stand without using hands and stabilize independently Standing Unsupported: Able to stand safely 2 minutes Sitting with Back Unsupported but Feet Supported on Floor or Stool: Able to sit safely and securely 2 minutes Stand to Sit: Sits safely with minimal use of hands Transfers: Able to transfer safely, minor use of hands Standing Unsupported with Eyes Closed: Able to stand 10 seconds safely Standing Ubsupported with Feet Together: Able to place feet together independently and stand 1 minute safely From Standing, Reach Forward with Outstretched Arm: Can reach forward >12 cm safely (5") From Standing Position, Pick up Object from Floor: Able to pick up shoe, needs supervision From Standing Position, Turn to Look Behind Over each Shoulder: Looks behind from both sides and weight shifts well Turn 360 Degrees: Able to turn 360 degrees safely one side only in 4 seconds or less Standing Unsupported, Alternately Place Feet on Step/Stool: Able to complete >2 steps/needs minimal assist Standing Unsupported, One Foot in Front: Needs help to step but can hold 15 seconds Standing on One Leg: Tries to lift leg/unable to hold 3 seconds but remains standing  independently Total Score: 44  Exercise    End of Session PT - End of Session Equipment  Utilized During Treatment: Gait belt Activity Tolerance: Patient tolerated treatment well Patient left: Other (comment) (Patient in wheelchair to be taken down to procedure.) General Behavior During Session: [facility] for tasks performed Cognition: [facility] for tasks performed  Ezzard Standing SPT 05/24/2011, 2:25 PM  [place], PT DPT [phone]

## 2011-05-24 NOTE — Progress Notes (Signed)
ANTICOAGULATION CONSULT NOTE - Initial Consult  Pharmacy Consult for Heparin Indication: atrial fibrillation and acute stroke  Allergies  Allergen Reactions  . Oxycontin Nausea And Vomiting  . Crestor (Rosuvastatin Calcium) Other (See Comments)    dizziness  . Statins     Cramping in legs  . Zetia (Ezetimibe) Other (See Comments)    dizziness    Patient Measurements: Height: 5\' 6"  (167.6 cm) Weight: 219 lb (99.338 kg) IBW/kg (Calculated) : 59.3   Vital Signs: Temp: 97.5 F (36.4 C) (03/14 0600) BP: 119/73 mmHg (03/14 0600) Pulse Rate: 60  (03/14 0600)  Labs:  Basename 05/24/11 0749 05/23/11 2136 05/23/11 0512 05/22/11 0608 05/22/11  HGB 12.0 -- 11.6* -- --  HCT 34.1* -- 33.9* -- --  PLT 199 -- 188 -- --  APTT -- -- 56* -- --  LABPROT 17.8* -- 22.7* 23.2* --  INR 1.44 -- 1.96* 2.02* --  HEPARINUNFRC 0.27* 0.19* -- -- --  CREATININE -- -- 0.85 -- 1.06  CKTOTAL -- -- -- -- --  CKMB -- -- -- -- --  TROPONINI -- -- -- -- --   Estimated Creatinine Clearance: 69 ml/min (by C-G formula based on Cr of 0.85).  Medical History: Past Medical History  Diagnosis Date  . Diabetes mellitus     Type 2  . Stroke     TIA per patient history form dated 02/15/10.  . Goiter   . Arthritis   . Dizziness - light-headed   . Family history of breast cancer     Did not specify what member of family per history form dated 02/15/10.  . Menopause   . Cancer     rt. breast ca  . Slow heart rate   . Hyperlipidemia   . Hypertension   . PONV (postoperative nausea and vomiting)     Medications:  Prescriptions prior to admission  Medication Sig Dispense Refill  . CALCIUM PO Take 630 mg by mouth daily.       . Cholecalciferol (VITAMIN D) 2000 UNITS tablet Take 2,000 Units by mouth daily.        . citalopram (CELEXA) 20 MG tablet Take 10 mg by mouth daily.        . Cyanocobalamin (VITAMIN B-12) 2500 MCG SUBL Place 1 tablet under the tongue daily.        . fish oil-omega-3 fatty acids  1000 MG capsule Take 2 g by mouth daily.        Marland Kitchen glipiZIDE (GLUCOTROL XL) 10 MG 24 hr tablet Take 10 mg by mouth 2 (two) times daily.        . hydrochlorothiazide (HYDRODIURIL) 25 MG tablet Take 12.5 mg by mouth daily.      Marland Kitchen letrozole (FEMARA) 2.5 MG tablet Take 1 tablet (2.5 mg total) by mouth daily.  30 tablet  11  . lisinopril (PRINIVIL,ZESTRIL) 10 MG tablet Take 10 mg by mouth every evening.        . Magnesium 250 MG TABS Take 2 tablets by mouth daily.        . metFORMIN (GLUCOPHAGE) 500 MG tablet Take 500 mg by mouth 2 (two) times daily.       . propranolol (INDERAL) 40 MG tablet Take 20 mg by mouth 2 (two) times daily.       Marland Kitchen warfarin (COUMADIN) 5 MG tablet Take 5 mg by mouth daily. 5mg  mwf and 7.5mg  all other days       Scheduled:     . aspirin  EC  81 mg Oral QPC breakfast  . calcium carbonate  1 tablet Oral Daily  . cholecalciferol  2,000 Units Oral Daily  . citalopram  10 mg Oral Daily  . clopidogrel  75 mg Oral Q breakfast  . vitamin B-12  2,500 mcg Oral Daily  . glipiZIDE  10 mg Oral BID AC  . letrozole  2.5 mg Oral Daily  . lisinopril  10 mg Oral QHS  . magnesium oxide  400 mg Oral Daily  . omega-3 acid ethyl esters  2 g Oral Daily  . pravastatin  20 mg Oral q1800  . propranolol  20 mg Oral BID  . Warfarin - Pharmacist Dosing Inpatient   Does not apply q1800    Assessment: 77 y/o female patient admitted with likely TIA, has been on coumadin but now on hold d/t plan for angiogram. INR 1.44 this AM. Awaiting angiogram   Goal of Therapy:  Xa=0.3-0.5   Plan:  Increase heparin to 1100 units/hr F/u after angiogram

## 2011-05-24 NOTE — Progress Notes (Signed)
Speech Language/Pathology Speech Pathology:  Treatment Note  Subjective:  Pt. In bed, alert, playing Meredeth Ide on her smartphone   Objective:  Treatment focused on increasing speech fluidity, intonation and natural prosody in conversational speech.  Pt. needed minimal verbal cues to identify and verbalize speech strategies in conversation of current medical status and explanations (directions to play internet game)  Speech was 98% intelligible with mildly decreased prosody and occasional distortions of fricative sounds ( /z/,/j/ etc).  Pt. Reports her speech is not as deliberate as on admission.  Education provided to increase intelligibility on the phone.   Assessment:   Pt. demonstrating progress in speech pattern, intelligibility and prosody   Recommendations: See once more for ST session.  Will downgrade frequency to 1 x week  Pain:   none Intervention Required:   No   Goals: Goals Partially Met  Breck Coons [place] M.Ed [employer] [phone]  05/24/2011

## 2011-05-25 ENCOUNTER — Telehealth (HOSPITAL_COMMUNITY): Payer: Self-pay

## 2011-05-25 ENCOUNTER — Telehealth (HOSPITAL_COMMUNITY): Payer: Self-pay | Admitting: Radiology

## 2011-05-25 DIAGNOSIS — I639 Cerebral infarction, unspecified: Secondary | ICD-10-CM | POA: Diagnosis present

## 2011-05-25 DIAGNOSIS — R739 Hyperglycemia, unspecified: Secondary | ICD-10-CM | POA: Diagnosis present

## 2011-05-25 LAB — CBC
HCT: 34.8 % — ABNORMAL LOW (ref 36.0–46.0)
MCHC: 34.8 g/dL (ref 30.0–36.0)
RDW: 12.4 % (ref 11.5–15.5)

## 2011-05-25 LAB — PROTIME-INR: INR: 1.21 (ref 0.00–1.49)

## 2011-05-25 LAB — GLUCOSE, CAPILLARY: Glucose-Capillary: 71 mg/dL (ref 70–99)

## 2011-05-25 MED ORDER — PRAVASTATIN SODIUM 20 MG PO TABS: 20.0000 mg | ORAL_TABLET | Freq: Every day | ORAL | Status: DC

## 2011-05-25 MED ORDER — ASPIRIN 81 MG PO TBEC: 162.0000 mg | DELAYED_RELEASE_TABLET | Freq: Every day | ORAL | Status: DC

## 2011-05-25 MED ORDER — CLOPIDOGREL BISULFATE 75 MG PO TABS: 75.0000 mg | ORAL_TABLET | Freq: Every day | ORAL | Status: DC

## 2011-05-25 NOTE — Progress Notes (Signed)
ANTICOAGULATION CONSULT NOTE - Follow Up Consult  Pharmacy Consult for heparin Indication: Afib/CVA  Labs:  Basename 05/25/11 0500 05/24/11 0749 05/23/11 2136 05/23/11 0512  HGB 12.1 12.0 -- --  HCT 34.8* 34.1* -- 33.9*  PLT 184 199 -- 188  APTT -- -- -- 56*  LABPROT 15.6* 17.8* -- 22.7*  INR 1.21 1.44 -- 1.96*  HEPARINUNFRC 0.23* 0.27* 0.19* --  CREATININE -- -- -- 0.85  CKTOTAL -- -- -- --  CKMB -- -- -- --  TROPONINI -- -- -- --    Assessment: 76yo female subtherapeutic on heparin after resumed post angiogram.  Goal of Therapy:  Heparin level 0.3-0.5   Plan:  Will increase heparin gtt by ~1 unit/kg/hr to 1200 units/hr and check level in 6-8hr.  Colleen Can PharmD BCPS 05/25/2011,6:18 AM

## 2011-05-25 NOTE — Progress Notes (Signed)
History: [name] is an 77 y.o. female with a history of previous stroke, right middle cerebral artery stent placement, hypertension, diabetes mellitus and recently diagnosed atrial fibrillation presenting with acute onset of left arm and leg weakness at 10 AM 05/20/11. Patient has improved significantly since the onset of weakness. She has been on Coumadin for 10-11 days. INR today is 1.4. CT scan of her head showed right MCA stent with no acute intracranial abnormalities. NIH score was 2. Patient was not a candidate for IV thrombolytic therapy with TPA because of rapidly resolving deficits. Patient is also status post pacemaker placement for bradycardia.   Subjective:  Stable. No complaints.Cerebral angio yesterday showed 85% intrastent stenosis.Plan to do PTA next week by Dr Corliss Skains.Patient wants to go home rather than stay in hospital over the weekend. Objective: BP 134/77  Pulse 61  Temp(Src) 97.8 F (36.6 C) (Oral)  Resp 18  Ht 5\' 6"  (1.676 m)  Wt 99.338 kg (219 lb)  BMI 35.35 kg/m2  SpO2 97% Telemetry:  CBGs Basename 05/25/11 0651 05/24/11 1652 05/24/11 1118 05/24/11 0652 05/23/11 2203 05/23/11 1635 05/23/11 1140 05/23/11 0643 05/22/11 2116 05/22/11 1638  GLUCAP 71 101* 102* 86 130* 84 211* 92 77 171*   Diet: CHO, thin Activity: up with assistance DVT Prophylaxis: warfarin, IV heparin  Medications: Scheduled:   . aspirin EC  81 mg Oral QPC breakfast  . calcium carbonate  1 tablet Oral Daily  . cholecalciferol  2,000 Units Oral Daily  . citalopram  10 mg Oral Daily  . clopidogrel  75 mg Oral Q breakfast  . vitamin B-12  2,500 mcg Oral Daily  . glipiZIDE  10 mg Oral BID AC  . heparin lock flush  500 Units Intravenous Once  . heparin lock flush  500 Units Intravenous Once  . letrozole  2.5 mg Oral Daily  . lisinopril  10 mg Oral QHS  . magnesium oxide  400 mg Oral Daily  . omega-3 acid ethyl esters  2 g Oral Daily  . pravastatin  20 mg Oral q1800  . propranolol   20 mg Oral BID  . warfarin  7.5 mg Oral NOW  . Warfarin - Pharmacist Dosing Inpatient   Does not apply q1800   Neurologic Exam: Mental Status: Alert, oriented, thought content appropriate.  Speech fluent , but slowed in outout.  No evidence of aphasia. Mild dysarthria persists.  Able to follow 3 step commands without difficulty.  Attentive and conversant. Cranial Nerves: II- Visual fields grossly intact. III/IV/VI-Extraocular movements intact.  Pupils reactive bilaterally. V/VII-flattened L NLF VIII-hearing grossly intact IX/X-normal gag XI-bilateral shoulder shrug XII-midline tongue extension Motor: 5/5 bilaterally with normal tone and bulk, left grip slightly weaker than right.   Sensory: Light touch intact throughout, bilaterally.  Discrimination intact. Deep Tendon Reflexes: 1+ and symmetric throughout, except 0 @ achilles Plantars: Downgoing bilaterally Cerebellar: Minimal dysmetria of LUE/ LLE on finger to nose, heel to shin, respectively.  Orbits left hand.  Lab Results: Basic Metabolic Panel:  Lab 05/23/11 4132 05/22/11  NA 135 132*  K 3.9 4.4  CL 100 96  CO2 28 28  GLUCOSE 94 111*  BUN 20 20  CREATININE 0.85 1.06  CALCIUM 9.4 9.6  MG -- --  PHOS -- --   Liver Function Tests:  Lab 05/20/11 1247  AST 20  ALT 12  ALKPHOS 52  BILITOT 0.3  PROT 6.4  ALBUMIN 3.3*   CBC:  Lab 05/25/11 0500 05/24/11 0749 05/20/11 1247  WBC 4.8 4.1 --  NEUTROABS -- -- 4.7  HGB 12.1 12.0 --  HCT 34.8* 34.1* --  MCV 90.2 89.7 --  PLT 184 199 --   Cardiac Enzymes:  Lab 05/20/11 1247  CKTOTAL 85  CKMB 3.1  CKMBINDEX --  TROPONINI <0.30   CBG:  Lab 05/25/11 0651 05/24/11 1652 05/24/11 1118 05/24/11 0652 05/23/11 2203 05/23/11 1635  GLUCAP 71 101* 102* 86 130* 84   Fasting Lipid Panel:  Lab 05/21/11 0615  CHOL 226*  HDL 38*  LDLCALC 152*  TRIG 182*  CHOLHDL 5.9  LDLDIRECT --   Coagulation:  Lab 05/25/11 0500 05/24/11 0749 05/23/11 0512 05/22/11 0608  LABPROT  15.6* 17.8* 22.7* 23.2*  INR 1.21 1.44 1.96* 2.02*   Study Results:  05/21/2011  CT ANGIOGRAPHY NECK  Findings:  Poor contrast bolus leads to suboptimal opacification of the extracranial intracranial vasculature.  Tortuous but nonstenotic innominate and right common carotid artery.  Tortuous but nonstenotic left common carotid artery. Nonstenotic calcific plaque origin left subclavian.   Moderate stenosis right vertebral origin.  Nonstenotic calcific plaque left vertebral origin.  No significant plaque left carotid bifurcation.  Right internal carotid artery widely patent.  Moderate calcific plaque right external carotid origin.   Both vertebrals widely patent in their cervical segments.  No neck masses.  Airway midline.  Moderate spondylosis.  Mild scarring lung apices.  Pacemaker.   Review of the MIP images confirms the above findings.  IMPRESSION: No significant extracranial internal carotid vascular disease. Moderate stenosis right vertebral origin and right external carotid origin.    05/21/2011  CT ANGIOGRAPHY HEAD Findings:  Widely patent internal carotid arteries in the skull base and supraclinoid segments.  Right MCA stent does not show significant intrastent contrast accumulation.  Severe intimal hyperplasia not excluded.  Diminished intensity of the M2 and M3 vessel enhancement on the right as compared to the left.  No visible left MCA stenosis.  Anterior and posterior cerebral arteries are widely patent.  Basilar artery widely patent with vertebrals codominant.  Mild atrophy.  Chronic microvascular ischemic change.  No acute large vessel infarct.  Unremarkable orbits, sinuses, and mastoids. Calvarium intact.   Review of the MIP images confirms the above findings.  IMPRESSION: Concern raised for significant right M1 MCA intrastent stenosis, likely intimal hyperplasia.  Recommend formal catheter angiogram.  [name], M.D.   05/20/2011  CXR Findings: Cardiomegaly.  Left chest wall battery pack  with dual leads, tips projecting over the right atrium and ventricle. Prominent mediastinal contours, similar to prior.  Mild left lung base opacity and mild left hemidiaphragm elevation.  No pleural effusion or pneumothorax.  No acute osseous abnormality.  IMPRESSION: Stable prominent cardiomediastinal contours.  Mild left lung base scarring versus atelectasis.   Waneta Martins, M.D.   05/20/2011   CT HEAD WITHOUT CONTRAST   Findings: Stent within right middle cerebral artery (M1 segment) unchanged compared to the prior examination.  Mild cerebral and cerebellar atrophy with mild chronic microvascular ischemic changes in the periventricular white matter of the cerebral hemispheres bilaterally.  No definite acute intracranial abnormality. Specifically, no definite signs of acute/subacute ischemia, no focal mass, mass effect, hydrocephalus or abnormal intra or extra- axial fluid collections.  Visualized paranasal sinuses and mastoids are well pneumatized.  No displaced skull fractures are noted.  IMPRESSION: 1.  No acute intracranial abnormalities. 2.  Mild cerebral and cerebellar atrophy with mild chronic microvascular ischemic changes in the white matter, as above.  DANIEL W.  Llana Aliment, M.D.   05/24/2011 CEREBRAL ANGIOGRAM S/p bilateral common carotid arteriograms, Rt cfa approach, Preliminary findings:  80- 85 % rt MCA intrastent stenosis   2D ECHO EF 55-60% with no source of embolus.   Carotid Dopplers: No internal carotid artery stenosis bilaterally. Vertebrals with antegrade flow bilaterally.   Therapies: home health PT recommended  Assessment/Plan: 77 y.o. female with resolving acute weakness of the left arm and leg, and to a lesser extent left lower face. Rt hemispheric  TIA is likely. However small subcortical right MCA territory stroke cannot be ruled out. Has chronic Afib. Pt currently on ASA, Plavix, IV heparin and coumadin  2. Atrial fibrillation subtherapeutic warfarin on admission  (INR  1.4) back on IV heparin and coumadin 3. Status post right MCA stent placement 2011, 80-85% Intra stent stenosis. 4. History of previous right CVA.  5. Hyperlipidemia- not at goal <80, LDL 152 6. Diabetes- A1C 6.9 7. Hypertension- controlled 8. PTVPM- bradycardia 9. H/O breast CA s/p mastectomy 2008.  On Femara.  Stroke Risk Factors - atrial fibrillation, diabetes mellitus and hypertension   Plan -DC heparin and dc home. Return next week for Rt MCA PTA by Dr Corliss Skains. Resume warfarin after PTA next week. - Continue ASA and plavix -OP PT recommended at d/c -D/w patient and family. F/u with me in 2 months.    LOS: 5 days   05/25/2011 7:45 AM

## 2011-05-25 NOTE — Telephone Encounter (Signed)
Spoke w/ Dr. Gwenlyn Perking  He wanted the pt's appt to be set so that she could be discharged// appt is 05-30-11// pt is to arrive [facility]. Case [phone]

## 2011-05-25 NOTE — Discharge Summary (Signed)
Physician Discharge Summary  Patient ID: [name] MRN: [id number] DOB/AGE: 1934-10-02 77 y.o.  Admit date: 05/20/2011 Discharge date: 05/25/2011  Primary Care Physician:  [name], MD, MD   Discharge Diagnoses:   .Acute left-sided weakness .DM (diabetes mellitus), IDDM .Hypertension .Pacemaker .Familial tremor .Stroke .Hyperglycemia *Hyperlipidemia   Medication List  As of 05/25/2011  4:20 PM   STOP taking these medications         warfarin 5 MG tablet         TAKE these medications         aspirin 81 MG EC tablet   Take 2 tablets (162 mg total) by mouth daily after breakfast.      CALCIUM PO   Take 630 mg by mouth daily.      citalopram 20 MG tablet   Commonly known as: CELEXA   Take 10 mg by mouth daily.      clopidogrel 75 MG tablet   Commonly known as: PLAVIX   Take 1 tablet (75 mg total) by mouth daily with breakfast.      fish oil-omega-3 fatty acids 1000 MG capsule   Take 2 g by mouth daily.      glipiZIDE 10 MG 24 hr tablet   Commonly known as: GLUCOTROL XL   Take 10 mg by mouth 2 (two) times daily.      hydrochlorothiazide 25 MG tablet   Commonly known as: HYDRODIURIL   Take 12.5 mg by mouth daily.      letrozole 2.5 MG tablet   Commonly known as: FEMARA   Take 1 tablet (2.5 mg total) by mouth daily.      lisinopril 10 MG tablet   Commonly known as: PRINIVIL,ZESTRIL   Take 10 mg by mouth every evening.      Magnesium 250 MG Tabs   Take 2 tablets by mouth daily.      metFORMIN 500 MG tablet   Commonly known as: GLUCOPHAGE   Take 500 mg by mouth 2 (two) times daily.      pravastatin 20 MG tablet   Commonly known as: PRAVACHOL   Take 1 tablet (20 mg total) by mouth daily at 6 PM.      propranolol 40 MG tablet   Commonly known as: INDERAL   Take 20 mg by mouth 2 (two) times daily.      Vitamin B-12 2500 MCG Subl   Place 1 tablet under the tongue daily.      Vitamin D 2000 UNITS tablet   Take 2,000 Units by mouth daily.             Disposition and Follow-up:  Patient discharged in a stable and improved condition. At this point plan is to continue holding Coumadin until 3 stent in of her MCA stent placed in 2011, this is happen on Wednesday 30/20/13 per Dr. Bedelia Person and as recommended by neurology resume coumadin after procedure. Patient will go home on ASA and plavix. She will also continue risk factors modification using statins, glipizide and metformin for her diabetes and also continue lisinopril and HCTZ for her hypertension.  Consults:   Neurology IR   Significant Diagnostic Studies:  Ct Angio Head W/cm &/or Wo Cm  05/21/2011  *RADIOLOGY REPORT*  Clinical Data:  Left-sided weakness and slurred speech.  CT ANGIOGRAPHY HEAD AND NECK  Technique:  Multidetector CT imaging of the head and neck was performed using the standard protocol during bolus administration of intravenous contrast.  Multiplanar CT image  reconstructions including MIPs were obtained to evaluate the vascular anatomy. Carotid stenosis measurements (when applicable) are obtained utilizing NASCET criteria, using the distal internal carotid diameter as the denominator.  Contrast: 50mL OMNIPAQUE IOHEXOL 350 MG/ML IV SOLN,  Comparison:  Cerebral angiography 09/27/2009.  CTA NECK  Findings:  Poor contrast bolus leads to suboptimal opacification of the extracranial intracranial vasculature.  Tortuous but nonstenotic innominate and right common carotid artery.  Tortuous but nonstenotic left common carotid artery. Nonstenotic calcific plaque origin left subclavian.   Moderate stenosis right vertebral origin.  Nonstenotic calcific plaque left vertebral origin.  No significant plaque left carotid bifurcation.  Right internal carotid artery widely patent.  Moderate calcific plaque right external carotid origin.   Both vertebrals widely patent in their cervical segments.  No neck masses.  Airway midline.  Moderate spondylosis.  Mild scarring lung apices.   Pacemaker.   Review of the MIP images confirms the above findings.  IMPRESSION: No significant extracranial internal carotid vascular disease. Moderate stenosis right vertebral origin and right external carotid origin.  CTA HEAD  Findings:  Widely patent internal carotid arteries in the skull base and supraclinoid segments.  Right MCA stent does not show significant intrastent contrast accumulation.  Severe intimal hyperplasia not excluded.  Diminished intensity of the M2 and M3 vessel enhancement on the right as compared to the left.  No visible left MCA stenosis.  Anterior and posterior cerebral arteries are widely patent.  Basilar artery widely patent with vertebrals codominant.  Mild atrophy.  Chronic microvascular ischemic change.  No acute large vessel infarct.  Unremarkable orbits, sinuses, and mastoids. Calvarium intact.   Review of the MIP images confirms the above findings.  IMPRESSION: Concern raised for significant right M1 MCA intrastent stenosis, likely intimal hyperplasia.  Recommend formal catheter angiogram.  Original Report Authenticated By: [name], M.D.   Dg Chest 2 View  05/20/2011  *RADIOLOGY REPORT*  Clinical Data: Hypertension, diabetes  CHEST - 2 VIEW  Comparison: 05/20/2011  Findings: Cardiomegaly.  Left chest wall battery pack with dual leads, tips projecting over the right atrium and ventricle. Prominent mediastinal contours, similar to prior.  Mild left lung base opacity and mild left hemidiaphragm elevation.  No pleural effusion or pneumothorax.  No acute osseous abnormality.  IMPRESSION: Stable prominent cardiomediastinal contours.  Mild left lung base scarring versus atelectasis.  Original Report Authenticated By: Waneta Martins, M.D.   Ct Head Wo Contrast  05/20/2011  *RADIOLOGY REPORT*  Clinical Data: Left arm weakness and heaviness today.  Code stroke.  CT HEAD WITHOUT CONTRAST  Technique:  Contiguous axial images were obtained from the base of the skull through  the vertex without contrast.  Comparison: Head CT 03/01/2011.  Findings: Stent within right middle cerebral artery (M1 segment) unchanged compared to the prior examination.  Mild cerebral and cerebellar atrophy with mild chronic microvascular ischemic changes in the periventricular white matter of the cerebral hemispheres bilaterally.  No definite acute intracranial abnormality. Specifically, no definite signs of acute/subacute ischemia, no focal mass, mass effect, hydrocephalus or abnormal intra or extra- axial fluid collections.  Visualized paranasal sinuses and mastoids are well pneumatized.  No displaced skull fractures are noted.  IMPRESSION: 1.  No acute intracranial abnormalities. 2.  Mild cerebral and cerebellar atrophy with mild chronic microvascular ischemic changes in the white matter, as above.  These results were called by telephone on 05/20/2011  at  01:10 p.m. to  Dr. Fonnie Jarvis, who verbally acknowledged these results.  Original Report Authenticated  By: Florencia Reasons, M.D.   Ct Angio Neck W/cm &/or Wo/cm  05/21/2011  *RADIOLOGY REPORT*  Clinical Data:  Left-sided weakness and slurred speech.  CT ANGIOGRAPHY HEAD AND NECK  Technique:  Multidetector CT imaging of the head and neck was performed using the standard protocol during bolus administration of intravenous contrast.  Multiplanar CT image reconstructions including MIPs were obtained to evaluate the vascular anatomy. Carotid stenosis measurements (when applicable) are obtained utilizing NASCET criteria, using the distal internal carotid diameter as the denominator.  Contrast: 50mL OMNIPAQUE IOHEXOL 350 MG/ML IV SOLN,  Comparison:  Cerebral angiography 09/27/2009.  CTA NECK  Findings:  Poor contrast bolus leads to suboptimal opacification of the extracranial intracranial vasculature.  Tortuous but nonstenotic innominate and right common carotid artery.  Tortuous but nonstenotic left common carotid artery. Nonstenotic calcific plaque origin  left subclavian.   Moderate stenosis right vertebral origin.  Nonstenotic calcific plaque left vertebral origin.  No significant plaque left carotid bifurcation.  Right internal carotid artery widely patent.  Moderate calcific plaque right external carotid origin.   Both vertebrals widely patent in their cervical segments.  No neck masses.  Airway midline.  Moderate spondylosis.  Mild scarring lung apices.  Pacemaker.   Review of the MIP images confirms the above findings.  IMPRESSION: No significant extracranial internal carotid vascular disease. Moderate stenosis right vertebral origin and right external carotid origin.  CTA HEAD  Findings:  Widely patent internal carotid arteries in the skull base and supraclinoid segments.  Right MCA stent does not show significant intrastent contrast accumulation.  Severe intimal hyperplasia not excluded.  Diminished intensity of the M2 and M3 vessel enhancement on the right as compared to the left.  No visible left MCA stenosis.  Anterior and posterior cerebral arteries are widely patent.  Basilar artery widely patent with vertebrals codominant.  Mild atrophy.  Chronic microvascular ischemic change.  No acute large vessel infarct.  Unremarkable orbits, sinuses, and mastoids. Calvarium intact.   Review of the MIP images confirms the above findings.  IMPRESSION: Concern raised for significant right M1 MCA intrastent stenosis, likely intimal hyperplasia.  Recommend formal catheter angiogram.  Original Report Authenticated By: [name], M.D.   05/24/2011 CEREBRAL ANGIOGRAM S/p bilateral common carotid arteriograms, Rt cfa approach, Preliminary findings: 80- 85 % rt MCA intrastent stenosis  2D ECHO EF 55-60% with no source of embolus.  Carotid Dopplers: No internal carotid artery stenosis bilaterally. Vertebrals with antegrade flow bilaterally.    Brief H and P: 77 y.o. female with a history of previous stroke, right middle cerebral artery stent placement,  hypertension, diabetes mellitus and recently diagnosed atrial fibrillation presenting with acute onset of left arm and leg weakness at 10 AM 05/20/11. Patient has improved significantly since the onset of weakness. She has been on Coumadin for 10-11 days. INR today is 1.4. CT scan of her head showed right MCA stent with no acute intracranial abnormalities. NIH score was 2. Patient was not a candidate for IV thrombolytic therapy with TPA because of rapidly resolving deficits. Patient is also status post pacemaker placement for bradycardia.     Hospital Course:  1-TIA: right side weakness improving. With symptomatic restenosis of her MCA stent placed in 2011. At this point plan is to hold coumadin; d/c home on ASA and plavix and to return for restenting per IR on 05/30/11. After restenting patient will resume coumadin. Continue risk factors modifications.  2. Atrial fibrillation on warfarin: Currently rate control (s/p pacemaker); home on  ASA and plavix as mentioned above for anticipated procedure next Wednesday and then resume coumadin.  3. Status post right MCA stent placement 2011: with Intra stent stenosis; plan for restenting on 05/30/11.  4. History of previous right CVA.: no significant deficit; continue outpatient PT/OT; will continue risk factors modifications.   5. Hyperlipidemia- Continue statins.   6. Diabetes- A1C 6.9; continue current therapy.   7. Hypertension- controlled; no changes to her medications needed at this point.   8. PTVPM- bradycardia; well controlled now.   9. H/O breast CA s/p mastectomy 2008. On Femara.   Time spent on Discharge: 45 minutes  Signed: Delvin Hedeen 05/25/2011, 4:20 PM

## 2011-05-25 NOTE — Progress Notes (Signed)
Patient has been scheduled for intervention with Dr. Corliss Skains as an OP for next week, 05/30/11.  We are aware of patient to be discharged on ASA and plavix daily until then.  Coumadin to be resumed after intervention per Dr. Pearlean Brownie.

## 2011-05-28 ENCOUNTER — Encounter (HOSPITAL_COMMUNITY): Payer: Self-pay | Admitting: Pharmacy Technician

## 2011-05-28 ENCOUNTER — Other Ambulatory Visit: Payer: Self-pay | Admitting: Radiology

## 2011-05-28 ENCOUNTER — Other Ambulatory Visit (HOSPITAL_COMMUNITY): Payer: Self-pay | Admitting: *Deleted

## 2011-05-28 NOTE — Pre-Procedure Instructions (Signed)
20 [name]  05/28/2011   Your procedure is scheduled on:  March 20th  Report to [facility] at 0630 AM.  Call this number if you have problems the morning of surgery: [phone]   Remember:   Do not eat food:After Midnight.  May have clear liquids: up to 4 Hours before arrival until 0230 am.  Clear liquids include soda, tea, black coffee, apple or grape juice, broth.  Take these medicines the morning of surgery with A SIP OF WATER: Celexa, Propranolol   Do not wear jewelry, make-up or nail polish.  Do not wear lotions, powders, or perfumes. You may wear deodorant.  Do not shave 48 hours prior to surgery.  Do not bring valuables to the hospital.  Contacts, dentures or bridgework may not be worn into surgery.  Leave suitcase in the car. After surgery it may be brought to your room.  For patients admitted to the hospital, checkout time is 11:00 AM the day of discharge.   Patients discharged the day of surgery will not be allowed to drive home.  Name and phone number of your driver:   Special Instructions: CHG Shower Use Special Wash: 1/2 bottle night before surgery and 1/2 bottle morning of surgery.   Please read over the following fact sheets that you were given: Pain Booklet, Coughing and Deep Breathing, MRSA Information and Surgical Site Infection Prevention

## 2011-05-29 ENCOUNTER — Encounter (HOSPITAL_COMMUNITY): Payer: Self-pay

## 2011-05-29 ENCOUNTER — Encounter (HOSPITAL_COMMUNITY)
Admission: RE | Admit: 2011-05-29 | Discharge: 2011-05-29 | Disposition: A | Discharge status: Home / Self Care | Payer: Medicare Other | Source: Ambulatory Visit | Attending: Interventional Radiology | Admitting: Interventional Radiology

## 2011-05-29 HISTORY — DX: Presence of cardiac pacemaker: Z95.0

## 2011-05-29 LAB — COMPREHENSIVE METABOLIC PANEL
Albumin: 3.5 g/dL (ref 3.5–5.2)
Alkaline Phosphatase: 54 U/L (ref 39–117)
BUN: 18 mg/dL (ref 6–23)
CO2: 25 mEq/L (ref 19–32)
Chloride: 92 mEq/L — ABNORMAL LOW (ref 96–112)
Creatinine, Ser: 0.89 mg/dL (ref 0.50–1.10)
GFR calc Af Amer: 72 mL/min — ABNORMAL LOW (ref >90)
GFR calc non Af Amer: 62 mL/min — ABNORMAL LOW (ref >90)
Glucose, Bld: 205 mg/dL — ABNORMAL HIGH (ref 70–99)
Potassium: 5.1 mEq/L (ref 3.5–5.1)
Total Bilirubin: 0.7 mg/dL (ref 0.3–1.2)

## 2011-05-29 LAB — DIFFERENTIAL
Basophils Absolute: 0 10*3/uL (ref 0.0–0.1)
Basophils Relative: 1 % (ref 0–1)
Eosinophils Absolute: 0.2 10*3/uL (ref 0.0–0.7)
Monocytes Relative: 10 % (ref 3–12)
Neutro Abs: 4.4 10*3/uL (ref 1.7–7.7)
Neutrophils Relative %: 68 % (ref 43–77)

## 2011-05-29 LAB — CBC
HCT: 36.7 % (ref 36.0–46.0)
Hemoglobin: 12.8 g/dL (ref 12.0–15.0)
MCV: 89.3 fL (ref 78.0–100.0)
RBC: 4.11 MIL/uL (ref 3.87–5.11)
RDW: 12.1 % (ref 11.5–15.5)
WBC: 6.4 10*3/uL (ref 4.0–10.5)

## 2011-05-29 LAB — SURGICAL PCR SCREEN: Staphylococcus aureus: NEGATIVE

## 2011-05-29 LAB — GLUCOSE, CAPILLARY: Glucose-Capillary: 171 mg/dL — ABNORMAL HIGH (ref 70–99)

## 2011-05-29 LAB — APTT: aPTT: 35 seconds (ref 24–37)

## 2011-05-29 MED ORDER — SODIUM CHLORIDE 0.9 % IV SOLN: Freq: Once | INTRAVENOUS | Status: DC

## 2011-05-29 NOTE — Progress Notes (Signed)
Pacemaker form faxed to [facility]. Echo and cath in epic

## 2011-05-30 ENCOUNTER — Inpatient Hospital Stay (HOSPITAL_COMMUNITY)
Admission: RE | Admit: 2011-05-30 | Discharge: 2011-05-31 | DRG: 253 | Disposition: A | Discharge status: Home / Self Care | Payer: Medicare Other | Source: Ambulatory Visit | Attending: Interventional Radiology | Admitting: Interventional Radiology

## 2011-05-30 ENCOUNTER — Ambulatory Visit (HOSPITAL_COMMUNITY)
Admit: 2011-05-30 | Discharge: 2011-05-30 | Disposition: A | Discharge status: Home / Self Care | Payer: Medicare Other | Attending: Interventional Radiology | Admitting: Interventional Radiology

## 2011-05-30 ENCOUNTER — Encounter (HOSPITAL_COMMUNITY): Payer: Self-pay | Admitting: Anesthesiology

## 2011-05-30 ENCOUNTER — Encounter (HOSPITAL_COMMUNITY): Payer: Self-pay

## 2011-05-30 ENCOUNTER — Encounter (HOSPITAL_COMMUNITY)
Admission: RE | Disposition: A | Discharge status: Home / Self Care | Payer: Self-pay | Source: Ambulatory Visit | Attending: Interventional Radiology

## 2011-05-30 ENCOUNTER — Ambulatory Visit (HOSPITAL_COMMUNITY): Payer: Medicare Other | Admitting: Anesthesiology

## 2011-05-30 VITALS — BP 138/76 | HR 62 | Temp 97.8°F | Resp 18

## 2011-05-30 DIAGNOSIS — Y849 Medical procedure, unspecified as the cause of abnormal reaction of the patient, or of later complication, without mention of misadventure at the time of the procedure: Secondary | ICD-10-CM | POA: Diagnosis present

## 2011-05-30 DIAGNOSIS — C50919 Malignant neoplasm of unspecified site of unspecified female breast: Secondary | ICD-10-CM | POA: Diagnosis present

## 2011-05-30 DIAGNOSIS — E871 Hypo-osmolality and hyponatremia: Secondary | ICD-10-CM

## 2011-05-30 DIAGNOSIS — K219 Gastro-esophageal reflux disease without esophagitis: Secondary | ICD-10-CM | POA: Diagnosis present

## 2011-05-30 DIAGNOSIS — E119 Type 2 diabetes mellitus without complications: Secondary | ICD-10-CM | POA: Diagnosis present

## 2011-05-30 DIAGNOSIS — I771 Stricture of artery: Secondary | ICD-10-CM

## 2011-05-30 DIAGNOSIS — R42 Dizziness and giddiness: Secondary | ICD-10-CM

## 2011-05-30 DIAGNOSIS — I10 Essential (primary) hypertension: Secondary | ICD-10-CM | POA: Diagnosis present

## 2011-05-30 DIAGNOSIS — Z7982 Long term (current) use of aspirin: Secondary | ICD-10-CM

## 2011-05-30 DIAGNOSIS — I639 Cerebral infarction, unspecified: Secondary | ICD-10-CM

## 2011-05-30 DIAGNOSIS — I6609 Occlusion and stenosis of unspecified middle cerebral artery: Secondary | ICD-10-CM | POA: Diagnosis present

## 2011-05-30 DIAGNOSIS — Z972 Presence of dental prosthetic device (complete) (partial): Secondary | ICD-10-CM

## 2011-05-30 DIAGNOSIS — Z853 Personal history of malignant neoplasm of breast: Secondary | ICD-10-CM

## 2011-05-30 DIAGNOSIS — R5383 Other fatigue: Secondary | ICD-10-CM

## 2011-05-30 DIAGNOSIS — R739 Hyperglycemia, unspecified: Secondary | ICD-10-CM

## 2011-05-30 DIAGNOSIS — R001 Bradycardia, unspecified: Secondary | ICD-10-CM

## 2011-05-30 DIAGNOSIS — Z8673 Personal history of transient ischemic attack (TIA), and cerebral infarction without residual deficits: Secondary | ICD-10-CM

## 2011-05-30 DIAGNOSIS — E860 Dehydration: Secondary | ICD-10-CM

## 2011-05-30 DIAGNOSIS — E785 Hyperlipidemia, unspecified: Secondary | ICD-10-CM | POA: Diagnosis present

## 2011-05-30 DIAGNOSIS — I447 Left bundle-branch block, unspecified: Secondary | ICD-10-CM

## 2011-05-30 DIAGNOSIS — Z01812 Encounter for preprocedural laboratory examination: Secondary | ICD-10-CM

## 2011-05-30 DIAGNOSIS — Z950 Presence of cardiac pacemaker: Secondary | ICD-10-CM

## 2011-05-30 DIAGNOSIS — I672 Cerebral atherosclerosis: Secondary | ICD-10-CM | POA: Diagnosis present

## 2011-05-30 DIAGNOSIS — Z79899 Other long term (current) drug therapy: Secondary | ICD-10-CM

## 2011-05-30 DIAGNOSIS — M129 Arthropathy, unspecified: Secondary | ICD-10-CM | POA: Diagnosis present

## 2011-05-30 DIAGNOSIS — T82898A Other specified complication of vascular prosthetic devices, implants and grafts, initial encounter: Principal | ICD-10-CM | POA: Diagnosis present

## 2011-05-30 DIAGNOSIS — R1110 Vomiting, unspecified: Secondary | ICD-10-CM

## 2011-05-30 DIAGNOSIS — I440 Atrioventricular block, first degree: Secondary | ICD-10-CM

## 2011-05-30 DIAGNOSIS — E049 Nontoxic goiter, unspecified: Secondary | ICD-10-CM | POA: Diagnosis present

## 2011-05-30 DIAGNOSIS — G250 Essential tremor: Secondary | ICD-10-CM

## 2011-05-30 DIAGNOSIS — R531 Weakness: Secondary | ICD-10-CM

## 2011-05-30 DIAGNOSIS — Z973 Presence of spectacles and contact lenses: Secondary | ICD-10-CM

## 2011-05-30 LAB — POCT ACTIVATED CLOTTING TIME
Activated Clotting Time: 166 seconds
Activated Clotting Time: 177 seconds

## 2011-05-30 LAB — GLUCOSE, CAPILLARY
Glucose-Capillary: 96 mg/dL (ref 70–99)
Glucose-Capillary: 186 mg/dL — ABNORMAL HIGH (ref 70–99)

## 2011-05-30 LAB — HEPARIN LEVEL (UNFRACTIONATED): Heparin Unfractionated: 0.17 IU/mL — ABNORMAL LOW (ref 0.30–0.70)

## 2011-05-30 LAB — PROTIME-INR: Prothrombin Time: 13.3 seconds (ref 11.6–15.2)

## 2011-05-30 SURGERY — RADIOLOGY WITH ANESTHESIA
Anesthesia: Monitor Anesthesia Care | Laterality: Right

## 2011-05-30 MED ORDER — ACETAMINOPHEN 500 MG PO TABS
1000.0000 mg | ORAL_TABLET | Freq: Four times a day (QID) | ORAL | Status: DC | PRN
  Administered 2011-05-30: 1000 mg via ORAL
  Filled 2011-05-30: qty 2

## 2011-05-30 MED ORDER — HEPARIN (PORCINE) IN NACL 100-0.45 UNIT/ML-% IJ SOLN
750.0000 [IU]/h | INTRAMUSCULAR | Status: AC
  Administered 2011-05-30: 750 [IU]/h via INTRAVENOUS
  Filled 2011-05-30: qty 250

## 2011-05-30 MED ORDER — CLOPIDOGREL BISULFATE 75 MG PO TABS
75.0000 mg | ORAL_TABLET | Freq: Every day | ORAL | Status: DC
  Administered 2011-05-31: 75 mg via ORAL
  Filled 2011-05-30 (×2): qty 1

## 2011-05-30 MED ORDER — INSULIN ASPART 100 UNIT/ML ~~LOC~~ SOLN: 0.0000 [IU] | Freq: Every day | SUBCUTANEOUS | Status: DC

## 2011-05-30 MED ORDER — SODIUM CHLORIDE 0.9 % IJ SOLN
1.5000 mg | INTRAVENOUS | Status: DC
  Filled 2011-05-30: qty 0.3

## 2011-05-30 MED ORDER — EPHEDRINE SULFATE 50 MG/ML IJ SOLN
INTRAMUSCULAR | Status: DC | PRN
  Administered 2011-05-30 (×3): 5 mg via INTRAVENOUS
  Administered 2011-05-30: 10 mg via INTRAVENOUS
  Administered 2011-05-30 (×3): 5 mg via INTRAVENOUS

## 2011-05-30 MED ORDER — ASPIRIN 325 MG PO TABS
325.0000 mg | ORAL_TABLET | Freq: Every day | ORAL | Status: DC
  Administered 2011-05-31: 325 mg via ORAL
  Filled 2011-05-30 (×2): qty 1

## 2011-05-30 MED ORDER — PHENYLEPHRINE HCL 10 MG/ML IJ SOLN
INTRAMUSCULAR | Status: DC | PRN
  Administered 2011-05-30 (×6): 40 ug via INTRAVENOUS
  Administered 2011-05-30: 160 ug via INTRAVENOUS

## 2011-05-30 MED ORDER — CEFAZOLIN SODIUM 1-5 GM-% IV SOLN
1.0000 g | Freq: Once | INTRAVENOUS | Status: AC
  Administered 2011-05-30: 1 g via INTRAVENOUS

## 2011-05-30 MED ORDER — HEPARIN SODIUM (PORCINE) 1000 UNIT/ML IJ SOLN
INTRAMUSCULAR | Status: DC | PRN
  Administered 2011-05-30 (×2): 500 [IU] via INTRAVENOUS
  Administered 2011-05-30: 3000 [IU] via INTRAVENOUS
  Administered 2011-05-30 (×2): 500 [IU] via INTRAVENOUS

## 2011-05-30 MED ORDER — LACTATED RINGERS IV SOLN
INTRAVENOUS | Status: DC | PRN
  Administered 2011-05-30 (×2): via INTRAVENOUS

## 2011-05-30 MED ORDER — NIMODIPINE 30 MG PO CAPS
60.0000 mg | ORAL_CAPSULE | Freq: Once | ORAL | Status: AC
  Administered 2011-05-30: 60 mg via ORAL

## 2011-05-30 MED ORDER — NICARDIPINE HCL IN NACL 20-0.86 MG/200ML-% IV SOLN
5.0000 mg/h | INTRAVENOUS | Status: DC
  Filled 2011-05-30: qty 200

## 2011-05-30 MED ORDER — INSULIN ASPART 100 UNIT/ML ~~LOC~~ SOLN: 0.0000 [IU] | Freq: Three times a day (TID) | SUBCUTANEOUS | Status: DC

## 2011-05-30 MED ORDER — FENTANYL CITRATE 0.05 MG/ML IJ SOLN: 25.0000 ug | INTRAMUSCULAR | Status: DC | PRN

## 2011-05-30 MED ORDER — NICARDIPINE HCL IN NACL 20-0.86 MG/200ML-% IV SOLN
5.0000 mg/h | INTRAVENOUS | Status: DC
  Administered 2011-05-30: 5 mg/h via INTRAVENOUS
  Filled 2011-05-30 (×2): qty 200

## 2011-05-30 MED ORDER — PROPOFOL 10 MG/ML IV EMUL
INTRAVENOUS | Status: DC | PRN
  Administered 2011-05-30: 100 mg via INTRAVENOUS
  Administered 2011-05-30 (×2): 20 mg via INTRAVENOUS

## 2011-05-30 MED ORDER — NICARDIPINE HCL IN NACL 20-0.86 MG/200ML-% IV SOLN
INTRAVENOUS | Status: DC | PRN
  Administered 2011-05-30: 2.5 mg/h via INTRAVENOUS

## 2011-05-30 MED ORDER — INSULIN ASPART 100 UNIT/ML ~~LOC~~ SOLN
0.0000 [IU] | Freq: Every day | SUBCUTANEOUS | Status: DC
  Filled 2011-05-30: qty 3

## 2011-05-30 MED ORDER — CEFAZOLIN SODIUM 1-5 GM-% IV SOLN
INTRAVENOUS | Status: AC
  Filled 2011-05-30: qty 50

## 2011-05-30 MED ORDER — SODIUM CHLORIDE 0.9 % IV SOLN
INTRAVENOUS | Status: AC
  Administered 2011-05-30: 15:00:00 via INTRAVENOUS

## 2011-05-30 MED ORDER — ONDANSETRON HCL 4 MG/2ML IJ SOLN: 4.0000 mg | Freq: Four times a day (QID) | INTRAMUSCULAR | Status: DC | PRN

## 2011-05-30 MED ORDER — CLOPIDOGREL BISULFATE 75 MG PO TABS
75.0000 mg | ORAL_TABLET | Freq: Once | ORAL | Status: AC
  Administered 2011-05-30: 75 mg via ORAL

## 2011-05-30 MED ORDER — ONDANSETRON HCL 4 MG/2ML IJ SOLN
INTRAMUSCULAR | Status: DC | PRN
  Administered 2011-05-30: 4 mg via INTRAVENOUS

## 2011-05-30 MED ORDER — NIMODIPINE 30 MG PO CAPS
ORAL_CAPSULE | ORAL | Status: AC
  Filled 2011-05-30: qty 2

## 2011-05-30 MED ORDER — IOHEXOL 300 MG/ML  SOLN
300.0000 mL | Freq: Once | INTRAMUSCULAR | Status: AC | PRN
  Administered 2011-05-30: 90 mL via INTRA_ARTERIAL

## 2011-05-30 MED ORDER — INSULIN ASPART 100 UNIT/ML ~~LOC~~ SOLN
0.0000 [IU] | Freq: Three times a day (TID) | SUBCUTANEOUS | Status: DC
  Filled 2011-05-30: qty 3

## 2011-05-30 MED ORDER — LACTATED RINGERS IV SOLN: INTRAVENOUS | Status: DC

## 2011-05-30 MED ORDER — HEPARIN (PORCINE) IN NACL 100-0.45 UNIT/ML-% IJ SOLN: 500.0000 [IU]/h | INTRAMUSCULAR | Status: DC

## 2011-05-30 MED ORDER — INSULIN ASPART 100 UNIT/ML ~~LOC~~ SOLN
0.0000 [IU] | Freq: Three times a day (TID) | SUBCUTANEOUS | Status: DC
  Administered 2011-05-30: 2 [IU] via SUBCUTANEOUS

## 2011-05-30 MED ORDER — ROCURONIUM BROMIDE 100 MG/10ML IV SOLN
INTRAVENOUS | Status: DC | PRN
  Administered 2011-05-30: 50 mg via INTRAVENOUS

## 2011-05-30 MED ORDER — ASPIRIN 325 MG PO TABS
325.0000 mg | ORAL_TABLET | Freq: Once | ORAL | Status: AC
  Administered 2011-05-30: 325 mg via ORAL
  Filled 2011-05-30: qty 1

## 2011-05-30 MED ORDER — DROPERIDOL 2.5 MG/ML IJ SOLN
INTRAMUSCULAR | Status: DC | PRN
  Administered 2011-05-30: 0.625 mg via INTRAVENOUS

## 2011-05-30 MED ORDER — FENTANYL CITRATE 0.05 MG/ML IJ SOLN
INTRAMUSCULAR | Status: DC | PRN
  Administered 2011-05-30 (×3): 50 ug via INTRAVENOUS

## 2011-05-30 MED ORDER — PHENYLEPHRINE HCL 10 MG/ML IJ SOLN
10.0000 mg | INTRAVENOUS | Status: DC | PRN
  Administered 2011-05-30: 5 ug/min via INTRAVENOUS

## 2011-05-30 MED ORDER — HEPARIN (PORCINE) IN NACL 100-0.45 UNIT/ML-% IJ SOLN: 750.0000 [IU]/h | INTRAMUSCULAR | Status: DC

## 2011-05-30 MED ORDER — HYDROCHLOROTHIAZIDE 12.5 MG PO CAPS
12.5000 mg | ORAL_CAPSULE | Freq: Every day | ORAL | Status: DC
  Administered 2011-05-30: 12.5 mg via ORAL
  Filled 2011-05-30 (×2): qty 1

## 2011-05-30 MED ORDER — ACETAMINOPHEN 650 MG RE SUPP: 650.0000 mg | Freq: Four times a day (QID) | RECTAL | Status: DC | PRN

## 2011-05-30 MED ORDER — CLOPIDOGREL BISULFATE 75 MG PO TABS
ORAL_TABLET | ORAL | Status: AC
  Filled 2011-05-30: qty 1

## 2011-05-30 MED ORDER — HEPARIN (PORCINE) IN NACL 100-0.45 UNIT/ML-% IJ SOLN
750.0000 [IU]/h | INTRAMUSCULAR | Status: DC
  Administered 2011-05-30: 750 [IU]/h via INTRAVENOUS
  Filled 2011-05-30 (×2): qty 250

## 2011-05-30 MED ORDER — ASPIRIN EC 325 MG PO TBEC
DELAYED_RELEASE_TABLET | ORAL | Status: AC
  Filled 2011-05-30: qty 1

## 2011-05-30 NOTE — Progress Notes (Signed)
ANTICOAGULATION CONSULT NOTE - Initial Consult  Pharmacy Consult for Heparin Indication: R MCA intrastent angioplasty/stent  Allergies  Allergen Reactions  . Oxycontin Nausea And Vomiting  . Crestor (Rosuvastatin Calcium) Other (See Comments)    dizziness  . Statins     Cramping in legs  . Zetia (Ezetimibe) Other (See Comments)    dizziness    Patient Measurements:   Heparin Dosing Weight: 74  Vital Signs: Temp: 97.8 F (36.6 C) (03/20 0742) Temp src: Oral (03/20 0742) BP: 138/76 mmHg (03/20 0742) Pulse Rate: 62  (03/20 0742)  Labs:  Basename 05/30/11 0723 05/29/11 0937  HGB -- 12.8  HCT -- 36.7  PLT -- 227  APTT -- 35  LABPROT 13.3 --  INR 0.99 --  HEPARINUNFRC -- --  CREATININE -- 0.89  CKTOTAL -- --  CKMB -- --  TROPONINI -- --   The CrCl is unknown because both a height and weight (above a minimum accepted value) are required for this calculation.  Medical History: Past Medical History  Diagnosis Date  . Goiter   . Arthritis   . Dizziness - light-headed   . Family history of breast cancer     Did not specify what member of family per history form dated 02/15/10.  . Menopause   . Slow heart rate   . Hyperlipidemia   . PONV (postoperative nausea and vomiting)   . Hypertension     dr croitoru   SE  . Pacemaker     st jude  . Stroke     TIA per patient history form dated 02/15/10.  . Diabetes mellitus     Type 2  . Cancer     rt. breast ca    Medications:  Prescriptions prior to admission  Medication Sig Dispense Refill  . aspirin EC 81 MG EC tablet Take 2 tablets (162 mg total) by mouth daily after breakfast.      . CALCIUM PO Take 630 mg by mouth daily.       . Cholecalciferol (VITAMIN D) 2000 UNITS tablet Take 2,000 Units by mouth daily.        . citalopram (CELEXA) 20 MG tablet Take 10 mg by mouth daily.        . clopidogrel (PLAVIX) 75 MG tablet Take 1 tablet (75 mg total) by mouth daily with breakfast.  30 tablet  1  . Cyanocobalamin  (VITAMIN B-12) 2500 MCG SUBL Place 1 tablet under the tongue daily.        . fish oil-omega-3 fatty acids 1000 MG capsule Take 2 g by mouth daily.        Marland Kitchen glipiZIDE (GLUCOTROL XL) 10 MG 24 hr tablet Take 10 mg by mouth 2 (two) times daily.        . hydrochlorothiazide (HYDRODIURIL) 25 MG tablet Take 12.5 mg by mouth daily.      Marland Kitchen letrozole (FEMARA) 2.5 MG tablet Take 1 tablet (2.5 mg total) by mouth daily.  30 tablet  11  . lisinopril (PRINIVIL,ZESTRIL) 10 MG tablet Take 10 mg by mouth every evening.        . Magnesium 250 MG TABS Take 500 mg by mouth daily.       . metFORMIN (GLUCOPHAGE) 500 MG tablet Take 500 mg by mouth 2 (two) times daily.       . pravastatin (PRAVACHOL) 20 MG tablet Take 1 tablet (20 mg total) by mouth daily at 6 PM.  30 tablet  1  . propranolol (INDERAL)  40 MG tablet Take 20 mg by mouth 2 (two) times daily.         Assessment: 77 y/o female with R middle cerebral artery angioplasty/stent placed 03/22/09 s/p CVA. New stenosis on MRA/arteriogram.Intrastent pta/stent with Dr Corliss Skains today.   Goal of Therapy:  Heparin level 0.1-0.2   Plan:  Heparin sent to IR to begin 500 units/hr as per protocol. Now increase to 750 units/hr and check heparin level in 8 hrs and in am.   Pasty Spillers 05/30/2011,12:31 PM

## 2011-05-30 NOTE — Progress Notes (Signed)
05/30/2011 16:15  Dr. Corliss Skains at bedside, aware of pt's neuro status. Orders received to use NBP cuff for measurement and treatment. Will continue to monitor pt.   Holly Bodily

## 2011-05-30 NOTE — Procedures (Signed)
S/P RT MCA angioplasty for for symptomatic stenosis  No acute complications

## 2011-05-30 NOTE — Anesthesia Postprocedure Evaluation (Signed)
  Anesthesia Post-op Note  Patient: [name]  Procedure(s) Performed: Procedure(s) (LRB): RADIOLOGY WITH ANESTHESIA (Right)  Patient Location: PACU  Anesthesia Type: General  Level of Consciousness: awake, alert  and oriented  Airway and Oxygen Therapy: Patient Spontanous Breathing and Patient connected to nasal cannula oxygen  Post-op Pain: none  Post-op Assessment: Post-op Vital signs reviewed, Patient's Cardiovascular Status Stable, Patent Airway, No signs of Nausea or vomiting and Pain level controlled  Post-op Vital Signs: Reviewed and stable  Complications: No apparent anesthesia complications

## 2011-05-30 NOTE — Anesthesia Preprocedure Evaluation (Addendum)
Anesthesia Evaluation  Patient identified by MRN, date of birth, ID band Patient awake    Reviewed: Allergy & Precautions, H&P , NPO status , Patient's Chart, lab work & pertinent test results  History of Anesthesia Complications (+) PONV  Airway Mallampati: II TM Distance: >3 FB Neck ROM: Full    Dental  (+) Poor Dentition and Dental Advisory Given   Pulmonary neg pulmonary ROS,  breath sounds clear to auscultation  Pulmonary exam normal       Cardiovascular hypertension, Pt. on medications + pacemaker (for bradycardia) Rhythm:Regular Rate:Normal  3/13 ECHO: normal LVF, EF 55-60%, mild-mod MR   Neuro/Psych TIA   GI/Hepatic GERD-  Controlled,H/o elevated LFTs   Endo/Other  Diabetes mellitus- (glu 113 this am), Type 2, Oral Hypoglycemic AgentsMorbid obesity  Renal/GU negative Renal ROS     Musculoskeletal  (+) Arthritis -,   Abdominal (+) + obese,   Peds  Hematology negative hematology ROS (+)   Anesthesia Other Findings   Reproductive/Obstetrics                          Anesthesia Physical Anesthesia Plan  ASA: III  Anesthesia Plan: MAC   Post-op Pain Management:    Induction:   Airway Management Planned: Simple Face Mask  Additional Equipment: Arterial line  Intra-op Plan:   Post-operative Plan:   Informed Consent: I have reviewed the patients History and Physical, chart, labs and discussed the procedure including the risks, benefits and alternatives for the proposed anesthesia with the patient or authorized representative who has indicated his/her understanding and acceptance.   Dental advisory given  Plan Discussed with: CRNA and Surgeon  Anesthesia Plan Comments: (Plan routine monitors, A-line, MAC for initial arteriogram and GA if needed for intravascular intervention)        Anesthesia Quick Evaluation

## 2011-05-30 NOTE — H&P (Signed)
[name] is an 77 y.o. female.   Chief Complaint: Right middle cerebral artery angioplasty/stent placed 03/22/09; New stenosis noted on MRA and arteriogram HPI: CVA 2011 Scheduled now for R MCA intrastent angioplasty/stent  Past Medical History  Diagnosis Date  . Diabetes mellitus     Type 2  . Stroke     TIA per patient history form dated 02/15/10.  . Goiter   . Arthritis   . Dizziness - light-headed   . Family history of breast cancer     Did not specify what member of family per history form dated 02/15/10.  . Menopause   . Cancer     rt. breast ca  . Slow heart rate   . Hyperlipidemia   . PONV (postoperative nausea and vomiting)   . Pacemaker     st jude  . Hypertension     dr croitoru   SE    Past Surgical History  Procedure Date  . Joint replacement     Knee - ask patient to clarify which knee or both.  . Breast surgery     lumpectomy  . Brain surgery     Stent placement  . Gallbladder surgery   . Mastectomy   . Thyroid surgery     due to Goiter  . Cholecystectomy   . Total knee arthroplasty 2008    left    Family History  Problem Relation Age of Onset  . COPD Mother   . Cancer Father     lung, colon  . Cancer Maternal Aunt     breast  . Cancer Maternal Grandmother     breast   Social History:  reports that she has never smoked. She does not have any smokeless tobacco history on file. She reports that she does not drink alcohol or use illicit drugs.  Allergies:  Allergies  Allergen Reactions  . Oxycontin Nausea And Vomiting  . Crestor (Rosuvastatin Calcium) Other (See Comments)    dizziness  . Statins     Cramping in legs  . Zetia (Ezetimibe) Other (See Comments)    dizziness    Medications Prior to Admission  Medication Dose Route Frequency Provider Last Rate Last Dose  . aspirin EC 325 MG tablet           . aspirin tablet 325 mg  325 mg Oral Once Robet Leu, PA      . ceFAZolin (ANCEF) IVPB 1 g/50 mL premix  1 g Intravenous  Once Robet Leu, PA      . clopidogrel (PLAVIX) tablet 75 mg  75 mg Oral Once Robet Leu, PA      . niMODipine (NIMOTOP) 30 MG capsule           . niMODipine (NIMOTOP) capsule 60 mg  60 mg Oral Once Robet Leu, PA      . DISCONTD: 0.9 %  sodium chloride infusion   Intravenous Once Robet Leu, PA       Medications Prior to Admission  Medication Sig Dispense Refill  . aspirin EC 81 MG EC tablet Take 2 tablets (162 mg total) by mouth daily after breakfast.      . CALCIUM PO Take 630 mg by mouth daily.       . Cholecalciferol (VITAMIN D) 2000 UNITS tablet Take 2,000 Units by mouth daily.        . citalopram (CELEXA) 20 MG tablet Take 10 mg by mouth daily.        Marland Kitchen  clopidogrel (PLAVIX) 75 MG tablet Take 1 tablet (75 mg total) by mouth daily with breakfast.  30 tablet  1  . Cyanocobalamin (VITAMIN B-12) 2500 MCG SUBL Place 1 tablet under the tongue daily.        . fish oil-omega-3 fatty acids 1000 MG capsule Take 2 g by mouth daily.        Marland Kitchen glipiZIDE (GLUCOTROL XL) 10 MG 24 hr tablet Take 10 mg by mouth 2 (two) times daily.        . hydrochlorothiazide (HYDRODIURIL) 25 MG tablet Take 12.5 mg by mouth daily.      Marland Kitchen letrozole (FEMARA) 2.5 MG tablet Take 1 tablet (2.5 mg total) by mouth daily.  30 tablet  11  . lisinopril (PRINIVIL,ZESTRIL) 10 MG tablet Take 10 mg by mouth every evening.        . Magnesium 250 MG TABS Take 500 mg by mouth daily.       . metFORMIN (GLUCOPHAGE) 500 MG tablet Take 500 mg by mouth 2 (two) times daily.       . pravastatin (PRAVACHOL) 20 MG tablet Take 1 tablet (20 mg total) by mouth daily at 6 PM.  30 tablet  1  . propranolol (INDERAL) 40 MG tablet Take 20 mg by mouth 2 (two) times daily.         Results for orders placed during the hospital encounter of 05/30/11 (from the past 48 hour(s))  GLUCOSE, CAPILLARY     Status: Abnormal   Collection Time   05/30/11  6:58 AM      Component Value Range Comment   Glucose-Capillary 113 (*) 70 - 99 (mg/dL)      No results found.  Review of Systems  Constitutional: Negative for fever.  Eyes: Negative for blurred vision.  Respiratory: Negative for cough.   Cardiovascular: Negative for chest pain.  Gastrointestinal: Negative for nausea and vomiting.  Neurological: Negative for headaches.    There were no vitals taken for this visit. Physical Exam  Constitutional: She is oriented to person, place, and time. She appears well-developed and well-nourished.  HENT:  Head: Normocephalic.  Eyes: EOM are normal.  Neck: Normal range of motion.  Cardiovascular: Normal rate, regular rhythm and normal heart sounds.   No murmur heard. Respiratory: Effort normal and breath sounds normal. She has no wheezes.  GI: Soft. Bowel sounds are normal. There is no tenderness.  Musculoskeletal: Normal range of motion.       Gait slightly unsteady- uses walker  Neurological: She is alert and oriented to person, place, and time. Coordination abnormal.  Skin: Skin is warm and dry.     Assessment/Plan CVA 2011; R MCA pta/stent placed 03/22/09; New stenosis noted on MRA/arteriogram Scheduled now for Intrastent pta/stent with Dr Corliss Skains Pt aware of procedure benefits and risks and agreeable to proceed. Understands she will probably stay overnight in Neuro ICU Consent signed and in chart  Elira Colasanti A 05/30/2011, 7:26 AM

## 2011-05-30 NOTE — Progress Notes (Signed)
Pharmacy - Heparin  Heparin level = 0.17 Therapeutic  Plan: 1) No change in heparin rate 2) Follow up AM for dc  Thank you.  Okey Regal, PharmD

## 2011-05-30 NOTE — Transfer of Care (Signed)
Immediate Anesthesia Transfer of Care Note  Patient: [name]  Procedure(s) Performed: Procedure(s) (LRB): RADIOLOGY WITH ANESTHESIA (Right)  Patient Location: PACU  Anesthesia Type: General  Level of Consciousness: awake, alert , oriented and sedated  Airway & Oxygen Therapy: Patient Spontanous Breathing and Patient connected to nasal cannula oxygen  Post-op Assessment: Report given to PACU RN, Post -op Vital signs reviewed and stable, Patient moving all extremities and Patient able to stick tongue midline  Post vital signs: Reviewed and stable  Complications: No apparent anesthesia complications

## 2011-05-30 NOTE — Progress Notes (Signed)
Day of Surgery  Subjective: Patient now in room from PACU.  Groggy but easily aroused.   Objective: Vital signs in last 24 hours: Temp:  [96.1 F (35.6 C)-97.8 F (36.6 C)] 97.1 F (36.2 C) (03/20 1440) Pulse Rate:  [59-65] 61  (03/20 1500) Resp:  [10-21] 16  (03/20 1500) BP: (109-138)/(48-76) 112/48 mmHg (03/20 1500) SpO2:  [97 %-100 %] 97 % (03/20 1500) Arterial Line BP: (129-156)/(51-66) 136/51 mmHg (03/20 1500)     Intake/Output this shift: Total I/O In: 1400 [I.V.:1400] Out: 1815 [Urine:1575; Blood:240]  Physical examination :  Easily aroused. Alert and oriented x 3.  Asks all appropriate questions.  EMOI, PERRL. Clear speech.  No pronator drift.  Groin site clean and dry with pressure bandage noted.  No hematoma felt.  2+ distal right femoral pulse. 1+ LE pitting edema.    Lab Results:   [facility] 05/29/11 0937  WBC 6.4  HGB 12.8  HCT 36.7  PLT 227   BMET  Basename 05/29/11 0937  NA 127*  K 5.1  CL 92*  CO2 25  GLUCOSE 205*  BUN 18  CREATININE 0.89  CALCIUM 9.9   PT/INR  Basename 05/30/11 0723  LABPROT 13.3  INR 0.99   Studies/Results:   Angiogram today with angioplasty of Rt MCA  Anti-infectives: Anti-infectives    None      Assessment/Plan:  1) intrastent stenosis s/p pta under general anesthesia.  Tolerating well so far with neuro exam intact.  Continue heparin until 0700.  Hold all home meds excluding HCTZ for now.     LOS: 0 days    Karey Stucki D 05/30/2011

## 2011-05-30 NOTE — Preoperative (Signed)
Beta Blockers   Reason not to administer Beta Blockers:Not Applicable 

## 2011-05-31 ENCOUNTER — Encounter (HOSPITAL_COMMUNITY): Payer: Self-pay

## 2011-05-31 DIAGNOSIS — I6609 Occlusion and stenosis of unspecified middle cerebral artery: Secondary | ICD-10-CM | POA: Diagnosis present

## 2011-05-31 LAB — CBC
Hemoglobin: 11.1 g/dL — ABNORMAL LOW (ref 12.0–15.0)
MCHC: 34.7 g/dL (ref 30.0–36.0)
Platelets: 190 10*3/uL (ref 150–400)
RBC: 3.51 MIL/uL — ABNORMAL LOW (ref 3.87–5.11)

## 2011-05-31 LAB — BASIC METABOLIC PANEL
BUN: 13 mg/dL (ref 6–23)
Chloride: 94 mEq/L — ABNORMAL LOW (ref 96–112)
GFR calc Af Amer: >90 mL/min (ref >90)
GFR calc non Af Amer: 83 mL/min — ABNORMAL LOW (ref >90)
Potassium: 4.1 mEq/L (ref 3.5–5.1)
Sodium: 128 mEq/L — ABNORMAL LOW (ref 135–145)

## 2011-05-31 LAB — DIFFERENTIAL
Basophils Absolute: 0 10*3/uL (ref 0.0–0.1)
Basophils Relative: 0 % (ref 0–1)
Monocytes Relative: 10 % (ref 3–12)
Neutro Abs: 2.7 10*3/uL (ref 1.7–7.7)
Neutrophils Relative %: 58 % (ref 43–77)

## 2011-05-31 LAB — GLUCOSE, CAPILLARY: Glucose-Capillary: 95 mg/dL (ref 70–99)

## 2011-05-31 LAB — HEPARIN LEVEL (UNFRACTIONATED): Heparin Unfractionated: 0.11 IU/mL — ABNORMAL LOW (ref 0.30–0.70)

## 2011-05-31 MED ORDER — ASPIRIN 81 MG PO TBEC: 81.0000 mg | DELAYED_RELEASE_TABLET | Freq: Every day | ORAL | Status: DC

## 2011-05-31 NOTE — Progress Notes (Addendum)
1 Day Post-Op  Subjective: Right Middle cerebral artery angioplasty 3/20 with Dr Corliss Skains Pt has done well overnight No pain; No complaints; No N/V  Objective: Vital signs in last 24 hours: Temp:  [96.1 F (35.6 C)-98.3 F (36.8 C)] 97.4 F (36.3 C) (03/21 0751) Pulse Rate:  [59-80] 61  (03/21 0800) Resp:  [10-21] 11  (03/21 0800) BP: (100-135)/(43-71) 124/70 mmHg (03/21 0800) SpO2:  [91 %-100 %] 99 % (03/21 0800) Arterial Line BP: (129-156)/(51-66) 143/56 mmHg (03/20 1600) Weight:  [223 lb 15.8 oz (101.6 kg)] 223 lb 15.8 oz (101.6 kg) (03/20 1440)    Intake/Output from previous day: 03/20 0701 - 03/21 0700 In: 2616.6 [I.V.:2616.6] Out: 3090 [Urine:2850; Blood:240] Intake/Output this shift:    PE:  VSS; afeb Heart RRR w/o M Lungs CTA Neuro intact Rt groin NT; no bleeding; no hematoma Rt foot 2+ pulses   Lab Results:   Basename 05/31/11 0407 05/29/11 0937  WBC 4.7 6.4  HGB 11.1* 12.8  HCT 32.0* 36.7  PLT 190 227   BMET  Basename 05/31/11 0407 05/29/11 0937  NA 128* 127*  K 4.1 5.1  CL 94* 92*  CO2 25 25  GLUCOSE 86 205*  BUN 13 18  CREATININE 0.72 0.89  CALCIUM 8.7 9.9   PT/INR  Basename 05/30/11 0723  LABPROT 13.3  INR 0.99   ABG No results found for this basename: PHART:2,PCO2:2,PO2:2,HCO3:2 in the last 72 hours  Studies/Results: No results found.  Anti-infectives: Anti-infectives    None      Assessment/Plan: s/p  Rt MCA pta 3/20 Doing quite well. Neuro intact Reported to Dr Corliss Skains For prob dc today. Dr Corliss Skains will see pt before dc. Hyponatremia noted.   Damaris Abeln A 05/31/2011

## 2011-05-31 NOTE — Clinical Documentation Improvement (Signed)
Abnormal Labs Clarification  THIS DOCUMENT IS NOT A PERMANENT PART OF THE MEDICAL RECORD  TO RESPOND TO THE THIS QUERY, FOLLOW THE INSTRUCTIONS BELOW:  1. If needed, update documentation for the patient's encounter via the notes activity.  2. Access this query again and click edit on the [employer].  3. After updating, or not, click F2 to complete all highlighted (required) fields concerning your review. Select "additional documentation in the medical record" OR "no additional documentation provided".  4. Click Sign note button.  5. The deficiency will fall out of your InBasket *Please let us know if you are not able to complete this workflow by phone or e-mail (listed below).  Please update your documentation within the medical record to reflect your response to this query.                                                                                   05/31/11  Dear Elita Quick,  In a better effort to capture your patient's severity of illness, reflect appropriate length of stay and utilization of resources, a review of the medical record has revealed the following indicators.   Based on your clinical judgment, please clarify and document in a progress note and/or discharge summary the clinical condition associated with the following supporting information: In responding to this query please exercise your independent judgment.  The fact that a query is asked, does not imply that any particular answer is desired or expected.  Abnormal findings (laboratory, x-ray, pathologic, and other diagnostic results) are not coded and reported unless the physician indicates their clinical significance.   The medical record reflects the following clinical findings, please clarify the diagnostic and/or clinical significance:      Possible Clinical Conditions?  - Hyponatremia  - Mild or Moderate Hyponatremia  - Chronic Hyponatremia  - Other condition (please document in the progress notes and/or  discharge summary)  - Cannot Clinically determine at this time   Supporting Information:  -  Component Sodium  Latest Ref Rng 135 - 145 mEq/L  05/29/2011 127 (L)  05/31/2011 128 (L)   - IV fluids = 0.9 % sodium chloride infusion @ 75mls/hr  -    Additional documentation in chart upon review. SM   Thank You,  Saul Fordyce  Clinical Documentation Specialist: [phone] Pager  Health Information Management [place]

## 2011-05-31 NOTE — Discharge Summary (Signed)
Physician Discharge Summary  Patient ID: [name] MRN: [id number] DOB/AGE: 75/21/1936 75 y.o.  Admit date: 05/30/2011 Discharge date: 05/31/2011  Admission Diagnoses: Principal Problem:  *Middle cerebral artery stenosis  Discharge Diagnoses:  Principal Problem:  *Middle cerebral artery stenosis    Procedures: Procedure(s): CEREBRAL ARTERIOGRAM WITH (R)MCA ANGIOPLASTY  Discharged Condition: good  Hospital Course: Pt with findings of (R)MCA stenosis. Brought to IR suite and underwent Rt MCA angioplasty and then admitted to Neuro ICU. No significant complications encountered. Pt seen by Dr. Corliss Skains post-procedure and POD#1 and agrees with discharge as pt stable for discharge on POD#1.  Consults: None   Discharge Exam: Blood pressure 123/73, pulse 72, temperature 97.4 F (36.3 C), temperature source Oral, resp. rate 19, height 5\' 6"  (1.676 m), weight 223 lb 15.8 oz (101.6 kg), SpO2 98.00%. Physical Examination: General appearance - alert, well appearing, and in no distress and oriented to person, place, and time Mental status - normal mood, behavior, speech, dress, motor activity, and thought processes Chest - clear to auscultation, no wheezes, rales or rhonchi, symmetric air entry Heart - normal rate, regular rhythm, normal S1, S2, no murmurs, rubs, clicks or gallops Abdomen - soft, nontender, nondistended, no masses or organomegaly Neurological - cranial nerves II through XII intact, motor and sensory grossly normal bilaterally Extremities - no pedal edema noted, intact 2+ peripheral pulses, (R)groin NT, no hematoma  Labs: Results for orders placed during the hospital encounter of 05/30/11  GLUCOSE, CAPILLARY      Component Value Range   Glucose-Capillary 113 (*) 70 - 99 (mg/dL)  HEPARIN LEVEL (UNFRACTIONATED)      Component Value Range   Heparin Unfractionated 0.17 (*) 0.30 - 0.70 (IU/mL)  GLUCOSE, CAPILLARY      Component Value Range   Glucose-Capillary 96  70 -  99 (mg/dL)   Comment 1 Documented in Chart     Comment 2 Notify RN    CBC      Component Value Range   WBC 4.7  4.0 - 10.5 (K/uL)   RBC 3.51 (*) 3.87 - 5.11 (MIL/uL)   Hemoglobin 11.1 (*) 12.0 - 15.0 (g/dL)   HCT 96.2 (*) 95.2 - 46.0 (%)   MCV 91.2  78.0 - 100.0 (fL)   MCH 31.6  26.0 - 34.0 (pg)   MCHC 34.7  30.0 - 36.0 (g/dL)   RDW 84.1  32.4 - 40.1 (%)   Platelets 190  150 - 400 (K/uL)  DIFFERENTIAL      Component Value Range   Neutrophils Relative 58  43 - 77 (%)   Neutro Abs 2.7  1.7 - 7.7 (K/uL)   Lymphocytes Relative 29  12 - 46 (%)   Lymphs Abs 1.4  0.7 - 4.0 (K/uL)   Monocytes Relative 10  3 - 12 (%)   Monocytes Absolute 0.5  0.1 - 1.0 (K/uL)   Eosinophils Relative 2  0 - 5 (%)   Eosinophils Absolute 0.1  0.0 - 0.7 (K/uL)   Basophils Relative 0  0 - 1 (%)   Basophils Absolute 0.0  0.0 - 0.1 (K/uL)  BASIC METABOLIC PANEL      Component Value Range   Sodium 128 (*) 135 - 145 (mEq/L)   Potassium 4.1  3.5 - 5.1 (mEq/L)   Chloride 94 (*) 96 - 112 (mEq/L)   CO2 25  19 - 32 (mEq/L)   Glucose, Bld 86  70 - 99 (mg/dL)   BUN 13  6 - 23 (mg/dL)  Creatinine, Ser 0.72  0.50 - 1.10 (mg/dL)   Calcium 8.7  8.4 - 16.1 (mg/dL)   GFR calc non Af Amer 83 (*) >90 (mL/min)   GFR calc Af Amer >90  >90 (mL/min)  HEPARIN LEVEL (UNFRACTIONATED)      Component Value Range   Heparin Unfractionated 0.11 (*) 0.30 - 0.70 (IU/mL)  GLUCOSE, CAPILLARY      Component Value Range   Glucose-Capillary 186 (*) 70 - 99 (mg/dL)   Comment 1 Notify RN     Comment 2 Documented in Chart    GLUCOSE, CAPILLARY      Component Value Range   Glucose-Capillary 103 (*) 70 - 99 (mg/dL)   Comment 1 Notify RN     Comment 2 Documented in Chart    GLUCOSE, CAPILLARY      Component Value Range   Glucose-Capillary 95  70 - 99 (mg/dL)   Comment 1 Documented in Chart       Disposition: 01-Home or Self Care Pt has been seen by attending MD, reviewed instructions for resuming home meds. Has appt scheduled on  4/3 as listed in After Visit Summary Pt has good understanding of discharge instructions.  Discharge Orders    Future Orders Please Complete By Expires   Diet - low sodium heart healthy      Increase activity slowly      May walk up steps      May shower / Bathe      Comments:   Beginning 3/22   Discharge instructions      Comments:   Contact Jennifer at [phone] with any questions or concerns. Follow up appointment is 06/13/11 with Dr. Corliss Skains at 1:00pm, [facility] to [facility] by 12:45pm   Driving Restrictions      Comments:   No driving for 2 weeks until appointment 4/3   Lifting restrictions      Comments:   No lifting >10lbs for 2 weeks.   Remove dressing in 24 hours      Call MD for:  temperature >100.4      Call MD for:  severe uncontrolled pain      Call MD for:  redness, tenderness, or signs of infection (pain, swelling, redness, odor or green/yellow discharge around incision site)        Medication List  As of 05/31/2011 11:18 AM   TAKE these medications         aspirin 81 MG EC tablet   Take 1 tablet (81 mg total) by mouth daily.      CALCIUM PO   Take 630 mg by mouth daily.      citalopram 20 MG tablet   Commonly known as: CELEXA   Take 10 mg by mouth daily.      clopidogrel 75 MG tablet   Commonly known as: PLAVIX   Take 1 tablet (75 mg total) by mouth daily with breakfast.      fish oil-omega-3 fatty acids 1000 MG capsule   Take 2 g by mouth daily.      glipiZIDE 10 MG 24 hr tablet   Commonly known as: GLUCOTROL XL   Take 10 mg by mouth 2 (two) times daily.      hydrochlorothiazide 25 MG tablet   Commonly known as: HYDRODIURIL   Take 12.5 mg by mouth daily.      letrozole 2.5 MG tablet   Commonly known as: FEMARA   Take 1 tablet (2.5 mg total) by mouth daily.  lisinopril 10 MG tablet   Commonly known as: PRINIVIL,ZESTRIL   Take 10 mg by mouth every evening.      Magnesium 250 MG Tabs   Take 500 mg by mouth daily.      metFORMIN  500 MG tablet   Commonly known as: GLUCOPHAGE   Take 500 mg by mouth 2 (two) times daily.      pravastatin 20 MG tablet   Commonly known as: PRAVACHOL   Take 1 tablet (20 mg total) by mouth daily at 6 PM.      propranolol 40 MG tablet   Commonly known as: INDERAL   Take 20 mg by mouth 2 (two) times daily.      Vitamin B-12 2500 MCG Subl   Place 1 tablet under the tongue daily.      Vitamin D 2000 UNITS tablet   Take 2,000 Units by mouth daily.             SignedBrayton El PA-C 05/31/2011, 11:18 AM

## 2011-06-04 ENCOUNTER — Telehealth (HOSPITAL_COMMUNITY): Payer: Self-pay

## 2011-06-04 NOTE — Telephone Encounter (Signed)
Called Mrs. Rieves to let her know that Dr. Corliss Skains stated if she feels worse to contact her PCP

## 2011-06-13 ENCOUNTER — Ambulatory Visit (HOSPITAL_COMMUNITY)
Admit: 2011-06-13 | Discharge: 2011-06-13 | Disposition: A | Discharge status: Home / Self Care | Payer: Medicare Other | Attending: Interventional Radiology | Admitting: Interventional Radiology

## 2011-06-24 ENCOUNTER — Emergency Department (INDEPENDENT_AMBULATORY_CARE_PROVIDER_SITE_OTHER): Payer: Medicare Other

## 2011-06-24 ENCOUNTER — Encounter (HOSPITAL_BASED_OUTPATIENT_CLINIC_OR_DEPARTMENT_OTHER): Payer: Self-pay | Admitting: *Deleted

## 2011-06-24 ENCOUNTER — Emergency Department (HOSPITAL_BASED_OUTPATIENT_CLINIC_OR_DEPARTMENT_OTHER)
Admission: EM | Admit: 2011-06-24 | Discharge: 2011-06-24 | Disposition: A | Discharge status: Home / Self Care | Payer: Medicare Other | Attending: Emergency Medicine | Admitting: Emergency Medicine

## 2011-06-24 DIAGNOSIS — Z79899 Other long term (current) drug therapy: Secondary | ICD-10-CM | POA: Insufficient documentation

## 2011-06-24 DIAGNOSIS — I10 Essential (primary) hypertension: Secondary | ICD-10-CM | POA: Insufficient documentation

## 2011-06-24 DIAGNOSIS — Z8673 Personal history of transient ischemic attack (TIA), and cerebral infarction without residual deficits: Secondary | ICD-10-CM | POA: Insufficient documentation

## 2011-06-24 DIAGNOSIS — R5383 Other fatigue: Secondary | ICD-10-CM | POA: Insufficient documentation

## 2011-06-24 DIAGNOSIS — R5381 Other malaise: Secondary | ICD-10-CM | POA: Insufficient documentation

## 2011-06-24 DIAGNOSIS — Z950 Presence of cardiac pacemaker: Secondary | ICD-10-CM | POA: Insufficient documentation

## 2011-06-24 DIAGNOSIS — R011 Cardiac murmur, unspecified: Secondary | ICD-10-CM | POA: Insufficient documentation

## 2011-06-24 DIAGNOSIS — R1013 Epigastric pain: Secondary | ICD-10-CM | POA: Insufficient documentation

## 2011-06-24 DIAGNOSIS — E119 Type 2 diabetes mellitus without complications: Secondary | ICD-10-CM | POA: Insufficient documentation

## 2011-06-24 DIAGNOSIS — H6120 Impacted cerumen, unspecified ear: Secondary | ICD-10-CM | POA: Insufficient documentation

## 2011-06-24 DIAGNOSIS — Z7982 Long term (current) use of aspirin: Secondary | ICD-10-CM | POA: Insufficient documentation

## 2011-06-24 DIAGNOSIS — R42 Dizziness and giddiness: Secondary | ICD-10-CM

## 2011-06-24 DIAGNOSIS — R609 Edema, unspecified: Secondary | ICD-10-CM | POA: Insufficient documentation

## 2011-06-24 DIAGNOSIS — R112 Nausea with vomiting, unspecified: Secondary | ICD-10-CM | POA: Insufficient documentation

## 2011-06-24 DIAGNOSIS — E785 Hyperlipidemia, unspecified: Secondary | ICD-10-CM | POA: Insufficient documentation

## 2011-06-24 DIAGNOSIS — H5500 Unspecified nystagmus: Secondary | ICD-10-CM | POA: Insufficient documentation

## 2011-06-24 DIAGNOSIS — Z853 Personal history of malignant neoplasm of breast: Secondary | ICD-10-CM | POA: Insufficient documentation

## 2011-06-24 DIAGNOSIS — R1110 Vomiting, unspecified: Secondary | ICD-10-CM

## 2011-06-24 DIAGNOSIS — N390 Urinary tract infection, site not specified: Secondary | ICD-10-CM | POA: Diagnosis present

## 2011-06-24 DIAGNOSIS — M129 Arthropathy, unspecified: Secondary | ICD-10-CM | POA: Insufficient documentation

## 2011-06-24 LAB — COMPREHENSIVE METABOLIC PANEL
AST: 17 U/L (ref 0–37)
Albumin: 4.1 g/dL (ref 3.5–5.2)
CO2: 28 mEq/L (ref 19–32)
Calcium: 10 mg/dL (ref 8.4–10.5)
Creatinine, Ser: 0.9 mg/dL (ref 0.50–1.10)
GFR calc non Af Amer: 61 mL/min — ABNORMAL LOW (ref >90)

## 2011-06-24 LAB — DIFFERENTIAL
Basophils Absolute: 0 10*3/uL (ref 0.0–0.1)
Basophils Relative: 0 % (ref 0–1)
Eosinophils Relative: 1 % (ref 0–5)
Monocytes Absolute: 0.4 10*3/uL (ref 0.1–1.0)

## 2011-06-24 LAB — URINALYSIS, ROUTINE W REFLEX MICROSCOPIC
Glucose, UA: NEGATIVE mg/dL
Hgb urine dipstick: NEGATIVE
Ketones, ur: NEGATIVE mg/dL
Protein, ur: NEGATIVE mg/dL

## 2011-06-24 LAB — CBC
HCT: 34.5 % — ABNORMAL LOW (ref 36.0–46.0)
MCHC: 35.9 g/dL (ref 30.0–36.0)
MCV: 89.6 fL (ref 78.0–100.0)
RDW: 11.9 % (ref 11.5–15.5)

## 2011-06-24 LAB — TROPONIN I: Troponin I: <0.3 ng/mL (ref <0.30)

## 2011-06-24 LAB — URINE MICROSCOPIC-ADD ON

## 2011-06-24 LAB — LIPASE, BLOOD: Lipase: 17 U/L (ref 11–59)

## 2011-06-24 MED ORDER — ONDANSETRON HCL 4 MG/2ML IJ SOLN
4.0000 mg | Freq: Once | INTRAMUSCULAR | Status: AC
  Administered 2011-06-24: 4 mg via INTRAVENOUS
  Filled 2011-06-24: qty 2

## 2011-06-24 MED ORDER — DIAZEPAM 2 MG PO TABS: 2.0000 mg | ORAL_TABLET | Freq: Four times a day (QID) | ORAL | Status: AC | PRN

## 2011-06-24 MED ORDER — DIAZEPAM 5 MG/ML IJ SOLN
2.5000 mg | Freq: Once | INTRAMUSCULAR | Status: AC
  Administered 2011-06-24: 2.5 mg via INTRAVENOUS
  Filled 2011-06-24: qty 2

## 2011-06-24 MED ORDER — MECLIZINE HCL 25 MG PO TABS
25.0000 mg | ORAL_TABLET | Freq: Once | ORAL | Status: AC
  Administered 2011-06-24: 25 mg via ORAL
  Filled 2011-06-24: qty 1

## 2011-06-24 MED ORDER — SODIUM CHLORIDE 0.9 % IV BOLUS (SEPSIS)
500.0000 mL | Freq: Once | INTRAVENOUS | Status: AC
  Administered 2011-06-24: 500 mL via INTRAVENOUS

## 2011-06-24 MED ORDER — DEXTROSE 5 % IV SOLN
1.0000 g | Freq: Once | INTRAVENOUS | Status: AC
  Administered 2011-06-24: 1 g via INTRAVENOUS
  Filled 2011-06-24: qty 10

## 2011-06-24 MED ORDER — PROMETHAZINE HCL 12.5 MG PO TABS: 12.5000 mg | ORAL_TABLET | Freq: Four times a day (QID) | ORAL | Status: DC | PRN

## 2011-06-24 NOTE — Consult Note (Signed)
[facility] CONSULT [facility]     Reason for Consult: Feeling of being off balance and no nausea since 06/21/2011     HPI:    [name] is an 77 y.o. female  with a history of previous stroke, right MCA stent, diabetes mellitus and hypertension presenting with symptoms of being off balance as well as nausea since 06/21/2011. Symptoms have been getting progressively worse. It was exacerbation of nausea standing and attempting to walk. Patient has fallen a couple of occasions but did not sustain significant injury. She has a pacemaker and could not undergo MRI study. CT scan showed moderate atherosclerotic disease and right MCA stent. There were no acute changes. Patient started taking meclizine 3 days ago which did not seem to help. She was given 2.5 mg of diazepam in the emergency room today as well as saw from her nausea. Her nausea has improved. She still feels off balance with standing. She has not experienced focal motor or sensory changes. She's had no changes in speech changes in vision.   Past Medical History  Diagnosis Date  . Goiter   . Arthritis   . Dizziness - light-headed   . Family history of breast cancer     Did not specify what member of family per history form dated 02/15/10.  . Menopause   . Slow heart rate   . Hyperlipidemia   . PONV (postoperative nausea and vomiting)   . Hypertension     dr croitoru   SE  . Pacemaker     st jude  . Stroke     TIA per patient history form dated 02/15/10.  . Diabetes mellitus     Type 2  . Cancer     rt. breast ca    Past Surgical History  Procedure Date  . Joint replacement     Knee - ask patient to clarify which knee or both.  . Breast surgery     lumpectomy  . Brain surgery     Stent placement  . Gallbladder surgery   . Mastectomy   . Thyroid surgery     due to Goiter  . Cholecystectomy   . Total knee arthroplasty 2008    left    Family History  Problem Relation Age of Onset  .  COPD Mother   . Cancer Father     lung, colon  . Cancer Maternal Aunt     breast  . Cancer Maternal Grandmother     breast    Social History:  reports that she has never smoked. She does not have any smokeless tobacco history on file. She reports that she does not drink alcohol or use illicit drugs.  Allergies  Allergen Reactions  . Oxycontin Nausea And Vomiting  . Crestor (Rosuvastatin Calcium) Other (See Comments)    dizziness  . Statins     Cramping in legs  . Zetia (Ezetimibe) Other (See Comments)    dizziness    Medications:    Scheduled:   . cefTRIAXone (ROCEPHIN)  IV  1 g Intravenous Once  . diazepam  2.5 mg Intravenous Once  . meclizine  25 mg Oral Once  . ondansetron (ZOFRAN) IV  4 mg Intravenous Once  . sodium chloride  500 mL Intravenous Once   Blood pressure 140/59, pulse 60, temperature 98.1 F (36.7 C), temperature source Oral, resp. rate 18,  height 5\' 6"  (1.676 m), weight 99.791 kg (220 lb), SpO2 94.00%.   Neurologic Examination:  Mental Status: Alert, oriented, thought content appropriate.  Speech is slow and somewhat dysarthric, which apparently has been speech pattern since previous stroke.. Able to follow commands without difficulty. Cranial Nerves: II-Visual fields were normal. III/IV/VI-Pupils were equal and reacted. Extraocular movements were full and conjugate.    V/VII-no facial numbness and no facial weakness. VIII-normal. X-normal speech and symmetrical palatal movement. XII-midline tongue extension Motor: 5/5 bilaterally with normal tone and bulk Sensory: Normal throughout. Deep Tendon Reflexes: 2+ and symmetric. Plantars: Flexor bilaterally Cerebellar: Normal finger-to-nose testing. She did not develop nausea on standing. She still felt however quite unsteady.   Lab Results  Component Value Date/Time   CHOL 226* 05/21/2011  6:15 AM     Ct Head Wo Contrast  06/24/2011  *RADIOLOGY REPORT*  Clinical Data: Status post cerebrovascular  accident the  CT HEAD WITHOUT CONTRAST  Technique:  Contiguous axial images were obtained from the base of the skull through the vertex without contrast.  Comparison: Head CT 05/21/2011  Findings: There is a stent in the right middle cerebral artery. There is no intracranial hemorrhage.  No CT evidence of acute infarction.  No midline shift or mass effect.  No hydrocephalus. Within the Paranasal sinuses and mastoid air cells are clear. Orbits are normal.  Mild periventricular white matter hypodensities.  IMPRESSION:  1.  No acute intracranial findings .  No change from prior. 2.  Right middle cerebral artery stent noted.  Original Report Authenticated By: Genevive Bi, M.D.     Assessment/Plan:   disequilibrium with associated nausea as well. Symptoms are exacerbated with position change, particularly with standing. Patient most likely is experiencing disequilibrium secondary to peripheral labyrinth dysfunction. There no clinical signs of brain stem or cerebellar stroke. Acute stroke is unlikely but cannot be completely ruled out. Unfortunately patient cannot undergo imaging with use of MRI because of the presence of a pacemaker.  Recommendations: 1. Continue low-dose diazepam 2.5 mg every 6 hours when necessary symptoms of disequilibrium. 2. Continue Zofran as needed for nausea. 3. Physical therapy evaluation and recommendations regarding gait and ambulation safety. 4. Continue aspirin and Plavix daily at current doses for stroke prophylaxis.    Venetia Maxon M.D.  [facility] [phone]   06/24/2011, 9:12 PM

## 2011-06-24 NOTE — Discharge Instructions (Signed)
Please read and follow all provided instructions.  Your diagnoses today include:  1. Vertigo   2. Nausea and vomiting     Tests performed today include:  Vital signs. See below for your results today.   Blood tests  Urine test  CT of your head that was normal and did not show any new stroke  Medications prescribed:   Valium - low dose for dizziness   Phenergan (promethazine) - for nausea and vomiting  Take any prescribed medications only as directed.  Home care instructions:  Follow any educational materials contained in this packet.  The prescribed medications can put you at risk of falling. Take prescribed medications only under close supervision and only as needed.   Follow-up instructions: Please follow-up with your primary care provider in the next 2 days for further evaluation of your symptoms. If you do not have a primary care doctor -- see below for referral information.   Return instructions:   Please return to the Emergency Department if you experience worsening symptoms.   Please return if you have any other emergent concerns.  Additional Information:  Your vital signs today were: BP 140/59  Pulse 60  Temp(Src) 97.7 F (36.5 C) (Oral)  Resp 18  Ht 5\' 6"  (1.676 m)  Wt 220 lb (99.791 kg)  BMI 35.51 kg/m2  SpO2 94% If your blood pressure (BP) was elevated above 135/85 this visit, please have this repeated by your doctor within one month. -------------- No Primary Care Doctor Call [facility]  [phone] Other agencies that provide inexpensive medical care    [facility]  [phone]    [facility]  [phone]    [facility]  [phone]    [facility]  [phone]    [facility]  [phone]    [facility]  [phone] -------------- RESOURCE GUIDE:  Dental Problems  Patients with Medicaid: [facility] [phone]                                             [phone] [address] Phone:  [phone]                                                   Phone:  [phone]  If unable to pay or uninsured, contact:  [facility] or [facility]. to become qualified for the adult dental clinic.  Chronic Pain Problems Contact [facility]  [phone] Patients need to be referred by their primary care doctor.  Insufficient Money for Medicine Contact [employer]:  call "211" or [facility] [phone].  Psychological Services [facility]  [phone] [facility]  [phone] [facility]   [phone] (emergency services [phone])  Substance Abuse Resources [facility]  [phone] [facility] [phone] [facility] [place] [phone] Floydene Flock [phone] Residential & Outpatient Substance Abuse Program  [phone]  Abuse/Neglect [facility] Child Abuse Hotline [phone] [facility] Child Abuse Hotline [phone]) [phone] (After Hours)  Emergency Shelter [facility] [phone]  Maternity Homes [facility] [place] [place] [facility] [phone] [employer] Services [phone]  [facility] Resources  [facility]     [employer]                          [facility] Dept. [address] [place]                       [address]      [address]  Blondell Reveal Phone:  [phone]                                   Phone:  [phone]                 Phone:  [phone]  [facility] Phone:  [phone]  [facility] Child Abuse Hotline [phone] [phone] (After Hours)

## 2011-06-24 NOTE — ED Notes (Signed)
Report to Lorene Dy, RN at [facility] ED

## 2011-06-24 NOTE — ED Provider Notes (Signed)
History     CSN: [id number]  Arrival date & time 06/24/11  1441   First MD Initiated Contact with Patient 06/24/11 1503      Chief Complaint  Patient presents with  . Emesis    (Consider location/radiation/quality/duration/timing/severity/associated sxs/prior treatment) HPI  Past Medical History  Diagnosis Date  . Goiter   . Arthritis   . Dizziness - light-headed   . Family history of breast cancer     Did not specify what member of family per history form dated 02/15/10.  . Menopause   . Slow heart rate   . Hyperlipidemia   . PONV (postoperative nausea and vomiting)   . Hypertension     dr croitoru   SE  . Pacemaker     st jude  . Stroke     TIA per patient history form dated 02/15/10.  . Diabetes mellitus     Type 2  . Cancer     rt. breast ca    Past Surgical History  Procedure Date  . Joint replacement     Knee - ask patient to clarify which knee or both.  . Breast surgery     lumpectomy  . Brain surgery     Stent placement  . Gallbladder surgery   . Mastectomy   . Thyroid surgery     due to Goiter  . Cholecystectomy   . Total knee arthroplasty 2008    left    Family History  Problem Relation Age of Onset  . COPD Mother   . Cancer Father     lung, colon  . Cancer Maternal Aunt     breast  . Cancer Maternal Grandmother     breast    History  Substance Use Topics  . Smoking status: Never Smoker   . Smokeless tobacco: Not on file  . Alcohol Use: No    OB History    Grav Para Term Preterm Abortions TAB SAB Ect Mult Living                  Review of Systems  Allergies  Oxycontin; Crestor; Statins; and Zetia  Home Medications   Current Outpatient Rx  Name Route Sig Dispense Refill  . ASPIRIN 81 MG PO TBEC Oral Take 1 tablet (81 mg total) by mouth daily. 30 tablet 0  . CALCIUM PO Oral Take 630 mg by mouth daily.     Marland Kitchen VITAMIN D 2000 UNITS PO TABS Oral Take 2,000 Units by mouth daily.      Marland Kitchen CITALOPRAM HYDROBROMIDE 20 MG PO TABS  Oral Take 10 mg by mouth daily.      Marland Kitchen CLOPIDOGREL BISULFATE 75 MG PO TABS Oral Take 1 tablet (75 mg total) by mouth daily with breakfast. 30 tablet 1  . VITAMIN B-12 2500 MCG SL SUBL Sublingual Place 1 tablet under the tongue daily.      . OMEGA-3 FATTY ACIDS 1000 MG PO CAPS Oral Take 2 g by mouth daily.      Marland Kitchen HYDROCHLOROTHIAZIDE 25 MG PO TABS Oral Take 12.5 mg by mouth daily.    Marland Kitchen LETROZOLE 2.5 MG PO TABS Oral Take 1 tablet (2.5 mg total) by mouth daily. 30 tablet 11  . METFORMIN HCL 500 MG PO TABS Oral Take 500 mg by mouth 2 (two) times daily.     Marland Kitchen PRAVASTATIN SODIUM 20 MG PO TABS Oral Take 1 tablet (20 mg total) by mouth daily at 6 PM. 30 tablet 1  .  PROPRANOLOL HCL 40 MG PO TABS Oral Take 20 mg by mouth 2 (two) times daily.     Marland Kitchen GLIPIZIDE ER 10 MG PO TB24 Oral Take 10 mg by mouth 2 (two) times daily.      Marland Kitchen LISINOPRIL 10 MG PO TABS Oral Take 10 mg by mouth every evening.      Marland Kitchen MAGNESIUM 250 MG PO TABS Oral Take 500 mg by mouth daily.       BP 140/59  Pulse 60  Temp(Src) 98.1 F (36.7 C) (Oral)  Resp 18  Ht 5\' 6"  (1.676 m)  Wt 220 lb (99.791 kg)  BMI 35.51 kg/m2  SpO2 94%  Physical Exam  ED Course  Procedures (including critical care time)  Labs Reviewed  GLUCOSE, CAPILLARY - Abnormal; Notable for the following:    Glucose-Capillary 142 (*)    All other components within normal limits  URINALYSIS, ROUTINE W REFLEX MICROSCOPIC - Abnormal; Notable for the following:    APPearance CLOUDY (*)    Leukocytes, UA MODERATE (*)    All other components within normal limits  CBC - Abnormal; Notable for the following:    RBC 3.85 (*)    HCT 34.5 (*)    All other components within normal limits  DIFFERENTIAL - Abnormal; Notable for the following:    Neutrophils Relative 79 (*)    All other components within normal limits  COMPREHENSIVE METABOLIC PANEL - Abnormal; Notable for the following:    Sodium 132 (*)    Chloride 94 (*)    Glucose, Bld 149 (*)    GFR calc non Af Amer  61 (*)    GFR calc Af Amer 71 (*)    All other components within normal limits  URINE MICROSCOPIC-ADD ON - Abnormal; Notable for the following:    Squamous Epithelial / LPF MANY (*)    Bacteria, UA FEW (*)    All other components within normal limits  LIPASE, BLOOD  TROPONIN I   Ct Head Wo Contrast  06/24/2011  *RADIOLOGY REPORT*  Clinical Data: Status post cerebrovascular accident the  CT HEAD WITHOUT CONTRAST  Technique:  Contiguous axial images were obtained from the base of the skull through the vertex without contrast.  Comparison: Head CT 05/21/2011  Findings: There is a stent in the right middle cerebral artery. There is no intracranial hemorrhage.  No CT evidence of acute infarction.  No midline shift or mass effect.  No hydrocephalus. Within the Paranasal sinuses and mastoid air cells are clear. Orbits are normal.  Mild periventricular white matter hypodensities.  IMPRESSION:  1.  No acute intracranial findings .  No change from prior. 2.  Right middle cerebral artery stent noted.  Original Report Authenticated By: Genevive Bi, M.D.     1. Vertigo   2. Nausea and vomiting    Patient sent from [facility] for neurological eval. She presented to the ED with vomiting and worse than baseline dysequilibrium. She states she cannot walk without a walker.  Vital signs reviewed and are as follows: Filed Vitals:   06/24/11 1915  BP: 140/59  Pulse: 60  Temp: 98.1 F (36.7 C)  Resp: 18   Patient was seen by neurology. Low suspicion for stroke. Patient states she is feeling better. Patient has been ambulatory to restroom with minimal assistance. Dr. Denton Lank has seen patient. Patient is agreeable to discharge to home with PCP follow-up. She requests phenergan for nausea and valium for dizziness. These were prescribed. Patient on fall risk and  to use these under supervision. Patient urged to follow-up with PCP in 2 days. Urged to return with worsening.     MDM  Vertigo/dysequilibrium,  improved. Neurology has seen. Do not suspect stroke. Patient improved. No vomiting. She is ambulatory. D/c to home.         [place], [place] 06/24/11 [phone]

## 2011-06-24 NOTE — ED Notes (Signed)
Pt states she has periodic episodes of this and was hospitalized for N/V last May/June and July for same. CVA 5 weeks ago today. Since then, more unstable. Having to use walker. Has had N/V since Friday. No pain, but dizzy. Fell Friday.

## 2011-06-24 NOTE — ED Provider Notes (Addendum)
History   Scribed for Forbes Cellar, MD, the patient was seen in room MH10/MH10 . This chart was scribed by Lewanda Rife.   CSN: [id number]  Arrival date & time 06/24/11  1441   First MD Initiated Contact with Patient 06/24/11 1503      Chief Complaint  Patient presents with  . Emesis    (Consider location/radiation/quality/duration/timing/severity/associated sxs/prior treatment) HPI  [name] is a 77 y.o. female who presents to the Emergency Department complaining of emesis since this morning. Pt states she's had symptoms of nausea and vertigo for the past 4 days. Pt states she has been taking phenergan to treat symptoms with some relief until this morning when emesis started. Pt describes the emesis as green in color, but denies hemoptysis and hematemesis. Pt has associated vertigo and light-headedness. Pt states she fell 3 days ago 2/2 vertigo, denies LOC, and no BHT. Pt denies chest pain, diarrhea, headache, shortness of breath, abdominal pain, dysuria, cough, fever, and neck stiffness. Pt states she has a pacemaker for bradycardia. Pt s/p CVA has a hx of right middle cerebral artery angioplasty/stent placed 03/22/09.  Pt noted to have restenosis of right MCA 80-85%, s/p balloon angioplasty  5 weeks ago Dr. Corliss Skains. States she can no longer walk without assistance. Has long standing problems with vertigo/dizziness with previous w/u at [facility]. Subsequently found to be bradycardic and pacemaker inserted at that time by [facility]. States that this feels different from her typical vertigo/N/V symptoms.  ED Notes, ED Provider Notes from 06/24/11 0000 to 06/24/11 14:52:29       York Cerise, RN 06/24/2011 14:48      Pt states she has periodic episodes of this and was hospitalized for N/V last May/June and July for same. CVA 5 weeks ago today. Since then, more unstable. Having to use walker. Has had N/V since Friday. No pain, but dizzy. Fell Friday.    Past Medical History    Diagnosis Date  . Goiter   . Arthritis   . Dizziness - light-headed   . Family history of breast cancer     Did not specify what member of family per history form dated 02/15/10.  . Menopause   . Slow heart rate   . Hyperlipidemia   . PONV (postoperative nausea and vomiting)   . Hypertension     dr croitoru   SE  . Pacemaker     st jude  . Stroke     TIA per patient history form dated 02/15/10.  . Diabetes mellitus     Type 2  . Cancer     rt. breast ca    Past Surgical History  Procedure Date  . Joint replacement     Knee - ask patient to clarify which knee or both.  . Breast surgery     lumpectomy  . Brain surgery     Stent placement  . Gallbladder surgery   . Mastectomy   . Thyroid surgery     due to Goiter  . Cholecystectomy   . Total knee arthroplasty 2008    left    Family History  Problem Relation Age of Onset  . COPD Mother   . Cancer Father     lung, colon  . Cancer Maternal Aunt     breast  . Cancer Maternal Grandmother     breast    History  Substance Use Topics  . Smoking status: Never Smoker   . Smokeless tobacco: Not on file  .  Alcohol Use: No    OB History    Grav Para Term Preterm Abortions TAB SAB Ect Mult Living                  Review of Systems  Constitutional: Positive for fatigue. Negative for fever and appetite change.  HENT: Negative.  Negative for neck pain and neck stiffness.   Eyes: Negative for visual disturbance.  Respiratory: Negative.  Negative for cough and shortness of breath.   Cardiovascular: Negative.  Negative for chest pain.  Gastrointestinal: Positive for nausea and vomiting. Negative for abdominal pain and diarrhea.  Genitourinary: Negative for dysuria.  Musculoskeletal: Negative.   Skin: Negative.   Neurological: Positive for dizziness and weakness. Negative for headaches.  Hematological: Negative.   Psychiatric/Behavioral: Negative.   All other systems reviewed and are negative.    Allergies   Oxycontin; Crestor; Statins; and Zetia  Home Medications   Current Outpatient Rx  Name Route Sig Dispense Refill  . ASPIRIN 81 MG PO TBEC Oral Take 1 tablet (81 mg total) by mouth daily. 30 tablet 0  . CALCIUM PO Oral Take 630 mg by mouth daily.     Marland Kitchen VITAMIN D 2000 UNITS PO TABS Oral Take 2,000 Units by mouth daily.      Marland Kitchen CITALOPRAM HYDROBROMIDE 20 MG PO TABS Oral Take 10 mg by mouth daily.      Marland Kitchen CLOPIDOGREL BISULFATE 75 MG PO TABS Oral Take 1 tablet (75 mg total) by mouth daily with breakfast. 30 tablet 1  . VITAMIN B-12 2500 MCG SL SUBL Sublingual Place 1 tablet under the tongue daily.      . OMEGA-3 FATTY ACIDS 1000 MG PO CAPS Oral Take 2 g by mouth daily.      Marland Kitchen HYDROCHLOROTHIAZIDE 25 MG PO TABS Oral Take 12.5 mg by mouth daily.    Marland Kitchen LETROZOLE 2.5 MG PO TABS Oral Take 1 tablet (2.5 mg total) by mouth daily. 30 tablet 11  . METFORMIN HCL 500 MG PO TABS Oral Take 500 mg by mouth 2 (two) times daily.     Marland Kitchen PRAVASTATIN SODIUM 20 MG PO TABS Oral Take 1 tablet (20 mg total) by mouth daily at 6 PM. 30 tablet 1  . PROPRANOLOL HCL 40 MG PO TABS Oral Take 20 mg by mouth 2 (two) times daily.     Marland Kitchen GLIPIZIDE ER 10 MG PO TB24 Oral Take 10 mg by mouth 2 (two) times daily.      Marland Kitchen LISINOPRIL 10 MG PO TABS Oral Take 10 mg by mouth every evening.      Marland Kitchen MAGNESIUM 250 MG PO TABS Oral Take 500 mg by mouth daily.       Pulse 58  Temp(Src) 97.5 F (36.4 C) (Oral)  Resp 20  Ht 5\' 6"  (1.676 m)  Wt 220 lb (99.791 kg)  BMI 35.51 kg/m2  SpO2 97%  Physical Exam  Nursing note and vitals reviewed. Constitutional: She is oriented to person, place, and time. She appears well-developed and well-nourished.  HENT:  Head: Normocephalic and atraumatic.  Left Ear: Tympanic membrane normal.       Dry oropharynx  Impacted right TM with cerumen  Visualized portion Lt TM unremarkable  Eyes: Conjunctivae and EOM are normal. Pupils are equal, round, and reactive to light.       Min horizontal nystagmus with  right gaze  Neck: Normal range of motion.  Cardiovascular: Normal rate and regular rhythm.   Murmur (3/6 systolic murmur )  heard. Pulmonary/Chest: No respiratory distress. She has no wheezes. She has no rales.  Abdominal: Soft. There is tenderness (mild epigastric tenderness). There is no rebound, no guarding and no CVA tenderness.  Musculoskeletal: Normal range of motion. She exhibits edema (trace edema billaterally in lower extremeties ).          Lymphadenopathy:    She has no cervical adenopathy.  Neurological: She is alert and oriented to person, place, and time. She has normal strength. No cranial nerve deficit or sensory deficit. She exhibits normal muscle tone. Coordination normal.  Skin: Skin is warm and dry.  Psychiatric: She has a normal mood and affect.    Date: 06/24/2011  Rate: 62  Rhythm: electronic pacemaker, appears to be v paced but difficult to determine 2/2 baseline artifact   Old EKG Reviewed: previous EKGs show clear pacer spikes   Date: 06/24/2011  Rate: 64  Rhythm: AV dual paced rhythm  Old EKG Reviewed: no significant change from previous  ED Course  Procedures (including critical care time)  Labs Reviewed  GLUCOSE, CAPILLARY - Abnormal; Notable for the following:    Glucose-Capillary 142 (*)    All other components within normal limits  URINALYSIS, ROUTINE W REFLEX MICROSCOPIC - Abnormal; Notable for the following:    APPearance CLOUDY (*)    Leukocytes, UA MODERATE (*)    All other components within normal limits  CBC - Abnormal; Notable for the following:    RBC 3.85 (*)    HCT 34.5 (*)    All other components within normal limits  DIFFERENTIAL - Abnormal; Notable for the following:    Neutrophils Relative 79 (*)    All other components within normal limits  COMPREHENSIVE METABOLIC PANEL - Abnormal; Notable for the following:    Sodium 132 (*)    Chloride 94 (*)    Glucose, Bld 149 (*)    GFR calc non Af Amer 61 (*)    GFR calc Af Amer 71  (*)    All other components within normal limits  URINE MICROSCOPIC-ADD ON - Abnormal; Notable for the following:    Squamous Epithelial / LPF MANY (*)    Bacteria, UA FEW (*)    All other components within normal limits  LIPASE, BLOOD  TROPONIN I   Ct Head Wo Contrast  06/24/2011  *RADIOLOGY REPORT*  Clinical Data: Status post cerebrovascular accident the  CT HEAD WITHOUT CONTRAST  Technique:  Contiguous axial images were obtained from the base of the skull through the vertex without contrast.  Comparison: Head CT 05/21/2011  Findings: There is a stent in the right middle cerebral artery. There is no intracranial hemorrhage.  No CT evidence of acute infarction.  No midline shift or mass effect.  No hydrocephalus. Within the Paranasal sinuses and mastoid air cells are clear. Orbits are normal.  Mild periventricular white matter hypodensities.  IMPRESSION:  1.  No acute intracranial findings .  No change from prior. 2.  Right middle cerebral artery stent noted.  Original Report Authenticated By: Genevive Bi, M.D.    1. Vertigo   2. Nausea and vomiting   3. UTI (lower urinary tract infection)      MDM  Significant CVA/TIA history as well as chronic vertigo PW N/V/Vertigo. Pt states this does not feel like her chronic symptoms. She has a pacemaker and therefore MRI cannot be obtained to evaluate for posterior circulation stroke. Patient without UTI sx and contaminated urine sample--possible UTI, will cover with ceftriaxone. Urine sent  for culture. Given IVF, zofran, meclizine, valium in ED. Refusing to walk 2/2 vertigo. Patient complex, discussed with Neurology Dr. Lyman Speller-- transfer to [facility] ED and they will evaluate in the emergency department.  Baseline artifact on EKG here, difficult to see pacer spikes. Will repeat. Repeat EKG with AV dual paced rhythm. Discussed transfer with Dr. Denton Lank in ED who accepted the patient. Family updated and aware of plan.  I personally performed the services  described in this documentation, which was scribed in my presence. The recorded information has been reviewed and considered.         Forbes Cellar, MD 06/24/11 1736  Forbes Cellar, MD 06/24/11 [phone]

## 2011-06-25 NOTE — ED Provider Notes (Signed)
Medical screening examination/treatment/procedure(s) were conducted as a shared visit with non-physician practitioner(s) and myself.  I personally evaluated the patient during the encounter Pt with hx cva, x chronic recurrent vertigo. States today w dizziness/vertigo similar to prior. No change in speech or vision. No numbness/weakness. No change in balance/gait from baseline. Neur consult in ed who feels no acute cva, pt w vertigo. Offered pt admit/obs vs d/c home. Pt now feels at baseline and requests d/c. Ambulates in ed without fall.   Suzi Roots, MD 06/25/11 2350

## 2011-07-04 ENCOUNTER — Other Ambulatory Visit: Payer: Self-pay | Admitting: Oncology

## 2011-07-04 DIAGNOSIS — Z853 Personal history of malignant neoplasm of breast: Secondary | ICD-10-CM

## 2011-08-13 ENCOUNTER — Encounter (INDEPENDENT_AMBULATORY_CARE_PROVIDER_SITE_OTHER): Payer: Self-pay | Admitting: Surgery

## 2011-09-10 ENCOUNTER — Other Ambulatory Visit (HOSPITAL_COMMUNITY): Payer: Self-pay | Admitting: Interventional Radiology

## 2011-09-10 DIAGNOSIS — I771 Stricture of artery: Secondary | ICD-10-CM

## 2011-09-12 ENCOUNTER — Encounter: Payer: Self-pay | Admitting: *Deleted

## 2011-09-14 ENCOUNTER — Telehealth: Payer: Self-pay | Admitting: *Deleted

## 2011-09-14 NOTE — Telephone Encounter (Signed)
patient having transportation issues requested to reschedule sent md an email to advise scheduling where the patient can be placed

## 2011-09-27 ENCOUNTER — Encounter (HOSPITAL_COMMUNITY): Payer: Self-pay | Admitting: Pharmacist

## 2011-10-03 ENCOUNTER — Other Ambulatory Visit: Payer: Self-pay | Admitting: Radiology

## 2011-10-08 ENCOUNTER — Encounter (HOSPITAL_COMMUNITY): Payer: Self-pay | Admitting: Respiratory Therapy

## 2011-10-08 ENCOUNTER — Ambulatory Visit (HOSPITAL_COMMUNITY)
Admission: RE | Admit: 2011-10-08 | Discharge: 2011-10-08 | Payer: Medicare Other | Source: Ambulatory Visit | Attending: Interventional Radiology | Admitting: Interventional Radiology

## 2011-10-09 ENCOUNTER — Other Ambulatory Visit: Payer: Self-pay | Admitting: Radiology

## 2011-10-11 ENCOUNTER — Ambulatory Visit (HOSPITAL_COMMUNITY)
Admission: RE | Admit: 2011-10-11 | Discharge: 2011-10-11 | Disposition: A | Discharge status: Home / Self Care | Payer: Medicare Other | Source: Ambulatory Visit | Attending: Interventional Radiology | Admitting: Interventional Radiology

## 2011-10-11 DIAGNOSIS — Z8673 Personal history of transient ischemic attack (TIA), and cerebral infarction without residual deficits: Secondary | ICD-10-CM | POA: Insufficient documentation

## 2011-10-11 DIAGNOSIS — Z853 Personal history of malignant neoplasm of breast: Secondary | ICD-10-CM | POA: Insufficient documentation

## 2011-10-11 DIAGNOSIS — T82898A Other specified complication of vascular prosthetic devices, implants and grafts, initial encounter: Secondary | ICD-10-CM | POA: Insufficient documentation

## 2011-10-11 DIAGNOSIS — I771 Stricture of artery: Secondary | ICD-10-CM

## 2011-10-11 DIAGNOSIS — I10 Essential (primary) hypertension: Secondary | ICD-10-CM | POA: Insufficient documentation

## 2011-10-11 DIAGNOSIS — Y831 Surgical operation with implant of artificial internal device as the cause of abnormal reaction of the patient, or of later complication, without mention of misadventure at the time of the procedure: Secondary | ICD-10-CM | POA: Insufficient documentation

## 2011-10-11 DIAGNOSIS — I672 Cerebral atherosclerosis: Secondary | ICD-10-CM | POA: Insufficient documentation

## 2011-10-11 DIAGNOSIS — E119 Type 2 diabetes mellitus without complications: Secondary | ICD-10-CM | POA: Insufficient documentation

## 2011-10-11 DIAGNOSIS — Z09 Encounter for follow-up examination after completed treatment for conditions other than malignant neoplasm: Secondary | ICD-10-CM | POA: Insufficient documentation

## 2011-10-11 LAB — BASIC METABOLIC PANEL
CO2: 28 mEq/L (ref 19–32)
Chloride: 93 mEq/L — ABNORMAL LOW (ref 96–112)
Creatinine, Ser: 0.94 mg/dL (ref 0.50–1.10)

## 2011-10-11 LAB — CBC WITH DIFFERENTIAL/PLATELET
Eosinophils Relative: 5 % (ref 0–5)
HCT: 34.6 % — ABNORMAL LOW (ref 36.0–46.0)
Lymphocytes Relative: 19 % (ref 12–46)
Lymphs Abs: 1 10*3/uL (ref 0.7–4.0)
MCV: 90.3 fL (ref 78.0–100.0)
Monocytes Absolute: 0.6 10*3/uL (ref 0.1–1.0)
RDW: 12.1 % (ref 11.5–15.5)
WBC: 4.9 10*3/uL (ref 4.0–10.5)

## 2011-10-11 LAB — APTT: aPTT: 40 seconds — ABNORMAL HIGH (ref 24–37)

## 2011-10-11 LAB — PLATELET INHIBITION P2Y12: Platelet Function  P2Y12: 142 [PRU] — ABNORMAL LOW (ref 194–418)

## 2011-10-11 MED ORDER — FENTANYL CITRATE 0.05 MG/ML IJ SOLN
INTRAMUSCULAR | Status: AC
  Filled 2011-10-11: qty 2

## 2011-10-11 MED ORDER — MIDAZOLAM HCL 2 MG/2ML IJ SOLN
INTRAMUSCULAR | Status: AC
  Filled 2011-10-11: qty 2

## 2011-10-11 MED ORDER — SODIUM CHLORIDE 0.9 % IV BOLUS (SEPSIS): 200.0000 mL | Freq: Once | INTRAVENOUS | Status: DC

## 2011-10-11 MED ORDER — SODIUM CHLORIDE 0.9 % IV SOLN
INTRAVENOUS | Status: AC | PRN
  Administered 2011-10-11: 75 mL/h via INTRAVENOUS

## 2011-10-11 MED ORDER — MIDAZOLAM HCL 5 MG/5ML IJ SOLN
INTRAMUSCULAR | Status: AC | PRN
  Administered 2011-10-11: 1 mg via INTRAVENOUS

## 2011-10-11 MED ORDER — FENTANYL CITRATE 0.05 MG/ML IJ SOLN
INTRAMUSCULAR | Status: AC | PRN
  Administered 2011-10-11: 25 ug via INTRAVENOUS

## 2011-10-11 MED ORDER — HEPARIN SOD (PORK) LOCK FLUSH 100 UNIT/ML IV SOLN
INTRAVENOUS | Status: AC | PRN
  Administered 2011-10-11 (×2): 500 [IU] via INTRAVENOUS

## 2011-10-11 MED ORDER — SODIUM CHLORIDE 0.9 % IV SOLN: INTRAVENOUS | Status: AC

## 2011-10-11 MED ORDER — IOHEXOL 300 MG/ML  SOLN
150.0000 mL | Freq: Once | INTRAMUSCULAR | Status: AC | PRN
  Administered 2011-10-11: 60 mL via INTRA_ARTERIAL

## 2011-10-11 MED ORDER — SODIUM CHLORIDE 0.9 % IV SOLN
INTRAVENOUS | Status: DC
  Administered 2011-10-11: 1000 mL via INTRAVENOUS

## 2011-10-11 NOTE — H&P (Signed)
[name] is an 77 y.o. female.   Chief Complaint: "I'm here for an angiogram" HPI: Patient with prior history of symptomatic right middle cerebral artery stenosis, s/p angioplasty/stenting (last PTA 05/2011), presents today for follow up cerebral arteriogram to assess stability.  Past Medical History  Diagnosis Date  . Goiter   . Arthritis   . Dizziness - light-headed   . Family history of breast cancer     Did not specify what member of family per history form dated 02/15/10.  . Menopause   . Slow heart rate   . Hyperlipidemia   . PONV (postoperative nausea and vomiting)   . Hypertension     dr croitoru   SE  . Pacemaker     st jude  . Stroke     TIA per patient history form dated 02/15/10.  . Diabetes mellitus     Type 2  . Cancer     rt. breast ca    Past Surgical History  Procedure Date  . Joint replacement     Knee - ask patient to clarify which knee or both.  . Breast surgery     lumpectomy  . Brain surgery     Stent placement  . Gallbladder surgery   . Mastectomy   . Thyroid surgery     due to Goiter  . Cholecystectomy   . Total knee arthroplasty 2008    left    Family History  Problem Relation Age of Onset  . COPD Mother   . Cancer Father     lung, colon  . Cancer Maternal Aunt     breast  . Cancer Maternal Grandmother     breast   Social History:  reports that she has never smoked. She does not have any smokeless tobacco history on file. She reports that she does not drink alcohol or use illicit drugs.  Allergies:  Allergies  Allergen Reactions  . Oxycodone Hcl Er Nausea And Vomiting  . Crestor (Rosuvastatin Calcium) Other (See Comments)    dizziness  . Statins     Cramping in legs  . Zetia (Ezetimibe) Other (See Comments)    dizziness    Current outpatient prescriptions:aspirin 81 MG EC tablet, Take 81 mg by mouth every morning., Disp: , Rfl: ;  Calcium Citrate-Vitamin D (CALCIUM CITRATE + PO), Take 1 tablet by mouth every evening.,  Disp: , Rfl: ;  Cholecalciferol (VITAMIN D) 2000 UNITS tablet, Take 2,000 Units by mouth every morning. , Disp: , Rfl: ;  citalopram (CELEXA) 20 MG tablet, Take 10 mg by mouth 2 (two) times daily. , Disp: , Rfl:  clopidogrel (PLAVIX) 75 MG tablet, Take 75 mg by mouth every morning., Disp: , Rfl: ;  Cyanocobalamin (VITAMIN B-12) 2500 MCG SUBL, Place 2,500 mcg under the tongue every morning. , Disp: , Rfl: ;  fish oil-omega-3 fatty acids 1000 MG capsule, Take 1 g by mouth every morning., Disp: , Rfl: ;  glipiZIDE (GLUCOTROL XL) 10 MG 24 hr tablet, Take 10 mg by mouth daily with breakfast. , Disp: , Rfl:  hydrochlorothiazide (HYDRODIURIL) 25 MG tablet, Take 12.5 mg by mouth every morning. , Disp: , Rfl: ;  letrozole (FEMARA) 2.5 MG tablet, Take 2.5 mg by mouth every morning., Disp: , Rfl: ;  lisinopril (PRINIVIL,ZESTRIL) 10 MG tablet, Take 10 mg by mouth every evening. , Disp: , Rfl: ;  magnesium gluconate (MAGONATE) 500 MG tablet, Take 500 mg by mouth every morning., Disp: , Rfl:  metFORMIN (  GLUCOPHAGE) 500 MG tablet, Take 500 mg by mouth 2 (two) times daily. , Disp: , Rfl: ;  Multiple Vitamin (MULTIVITAMIN WITH MINERALS) TABS, Take 1 tablet by mouth every morning., Disp: , Rfl: ;  propranolol ER (INDERAL LA) 60 MG 24 hr capsule, Take 60 mg by mouth every morning., Disp: , Rfl:  DISCONTD: insulin glargine (LANTUS SOLOSTAR) 100 UNIT/ML injection, Inject 5 Units into the skin daily as needed. For blood sugar over 120, Disp: , Rfl:  Current facility-administered medications:0.9 %  sodium chloride infusion, , Intravenous, Continuous, Robet Leu, PA, Last Rate: 20 mL/hr at 10/11/11 0743, 1,000 mL at 10/11/11 0743   Results for orders placed during the hospital encounter of 10/11/11 (from the past 48 hour(s))  APTT     Status: Abnormal   Collection Time   10/11/11  6:47 AM      Component Value Range Comment   aPTT 40 (*) 24 - 37 seconds   BASIC METABOLIC PANEL     Status: Abnormal   Collection Time    10/11/11  6:47 AM      Component Value Range Comment   Sodium 132 (*) 135 - 145 mEq/L    Potassium 4.2  3.5 - 5.1 mEq/L    Chloride 93 (*) 96 - 112 mEq/L    CO2 28  19 - 32 mEq/L    Glucose, Bld 119 (*) 70 - 99 mg/dL    BUN 18  6 - 23 mg/dL    Creatinine, Ser 1.30  0.50 - 1.10 mg/dL    Calcium 86.5  8.4 - 10.5 mg/dL    GFR calc non Af Amer 58 (*) >90 mL/min    GFR calc Af Amer 67 (*) >90 mL/min   CBC WITH DIFFERENTIAL     Status: Abnormal   Collection Time   10/11/11  6:47 AM      Component Value Range Comment   WBC 4.9  4.0 - 10.5 K/uL    RBC 3.83 (*) 3.87 - 5.11 MIL/uL    Hemoglobin 12.1  12.0 - 15.0 g/dL    HCT 78.4 (*) 69.6 - 46.0 %    MCV 90.3  78.0 - 100.0 fL    MCH 31.6  26.0 - 34.0 pg    MCHC 35.0  30.0 - 36.0 g/dL    RDW 29.5  28.4 - 13.2 %    Platelets 268  150 - 400 K/uL    Neutrophils Relative 64  43 - 77 %    Neutro Abs 3.1  1.7 - 7.7 K/uL    Lymphocytes Relative 19  12 - 46 %    Lymphs Abs 1.0  0.7 - 4.0 K/uL    Monocytes Relative 11  3 - 12 %    Monocytes Absolute 0.6  0.1 - 1.0 K/uL    Eosinophils Relative 5  0 - 5 %    Eosinophils Absolute 0.2  0.0 - 0.7 K/uL    Basophils Relative 1  0 - 1 %    Basophils Absolute 0.0  0.0 - 0.1 K/uL   PROTIME-INR     Status: Normal   Collection Time   10/11/11  6:47 AM      Component Value Range Comment   Prothrombin Time 12.6  11.6 - 15.2 seconds    INR 0.92  0.00 - 1.49   GLUCOSE, CAPILLARY     Status: Abnormal   Collection Time   10/11/11  6:56 AM      Component  Value Range Comment   Glucose-Capillary 106 (*) 70 - 99 mg/dL    No results found.  Review of Systems  Constitutional: Negative for fever and chills.  Eyes: Negative for blurred vision and double vision.  Respiratory: Negative for cough.        Some DOE  Cardiovascular:       Occ left lower chest discomfort (under breast)  Gastrointestinal: Negative for nausea, vomiting and abdominal pain.  Musculoskeletal: Negative for back pain.  Neurological: Positive  for dizziness. Negative for focal weakness, loss of consciousness and headaches.       Some sl speech hesistancy; occ balance difficulties  Endo/Heme/Allergies: Does not bruise/bleed easily.   Vitals: BP 134/86  HR 62  R 18  TEMP 97.4 Physical Exam  Constitutional: She is oriented to person, place, and time. She appears well-developed and well-nourished.  Cardiovascular: Regular rhythm.        Bradycardic; pt has pacemaker  Respiratory: Effort normal.       Sl dim BS left base, right clear  GI: Soft. Bowel sounds are normal. There is no tenderness.  Musculoskeletal: Normal range of motion. She exhibits no edema.  Neurological: She is alert and oriented to person, place, and time.       Speech with sl hesistancy; tongue midline , face symm, no drift, EOMI; FMM/FtoN nl, strength 5/5 all fours, sens fxn intact     Assessment/Plan *Pt with hx of symptomatic right middle cerebral artery stenosis, s/p angioplasty/stenting with last PTA 05/2011; plan is for follow up cerebral arteriogram today to assess stability.  Akshath Mccarey,D KEVIN 10/11/2011, 8:25 AM

## 2011-10-11 NOTE — ED Notes (Signed)
Pedal Pulses:  DP +2 x 2                          PT +1 X 2

## 2011-10-11 NOTE — Procedures (Signed)
S/P bilateral common carotid arteriograms  RT CFA approach  Findings  1.Severe Rt MCA intrastent stenosis.[name]

## 2011-10-11 NOTE — ED Notes (Addendum)
Per Dr. Corliss Skains, pt to receive 200cc NS bolus.  Bolus started

## 2011-10-11 NOTE — ED Notes (Signed)
O2 2L [place] 

## 2011-10-11 NOTE — ED Notes (Signed)
O2 d/c'd 

## 2011-10-12 ENCOUNTER — Telehealth (HOSPITAL_COMMUNITY): Payer: Self-pay

## 2011-10-12 ENCOUNTER — Other Ambulatory Visit (HOSPITAL_COMMUNITY): Payer: Self-pay | Admitting: Interventional Radiology

## 2011-10-12 ENCOUNTER — Encounter (HOSPITAL_COMMUNITY): Payer: Self-pay | Admitting: Pharmacy Technician

## 2011-10-12 DIAGNOSIS — I771 Stricture of artery: Secondary | ICD-10-CM

## 2011-10-12 NOTE — Telephone Encounter (Signed)
I called [name] to go over her procedure date and time.

## 2011-10-15 ENCOUNTER — Other Ambulatory Visit: Payer: Self-pay | Admitting: Radiology

## 2011-10-15 ENCOUNTER — Encounter (HOSPITAL_COMMUNITY)
Admission: RE | Admit: 2011-10-15 | Discharge: 2011-10-15 | Disposition: A | Discharge status: Home / Self Care | Payer: Medicare Other | Source: Ambulatory Visit | Attending: Interventional Radiology | Admitting: Interventional Radiology

## 2011-10-15 ENCOUNTER — Encounter (HOSPITAL_COMMUNITY): Payer: Self-pay

## 2011-10-15 HISTORY — DX: Family history of other specified conditions: Z84.89

## 2011-10-15 HISTORY — DX: Major depressive disorder, single episode, unspecified: F32.9

## 2011-10-15 HISTORY — DX: Depression, unspecified: F32.A

## 2011-10-15 HISTORY — DX: Essential tremor: G25.0

## 2011-10-15 NOTE — Progress Notes (Signed)
I spoke with [name] PA , he said to do the ordered  labs on 10/22/11.  (Pt had labs 10/11/11.

## 2011-10-15 NOTE — Pre-Procedure Instructions (Signed)
20 [name]  10/15/2011   Your procedure is scheduled on:  Monday, August 12th.  Report to [facility] at 6:30 AM.  Call this number if you have problems the morning of surgery: [phone]   Remember:   Do not eat food or anything to drink:After Midnight.     Take these medicines the morning of surgery with A SIP OF WATER: Citalopram and Inderal.   Do not wear jewelry, make-up or nail polish.  Do not wear lotions, powders, or perfumes. You may wear deodorant.  Do not shave 48 hours prior to surgery. Men may shave face and neck.  Do not bring valuables to the hospital.  Contacts, dentures or bridgework may not be worn into surgery.  Leave suitcase in the car. After surgery it may be brought to your room.  For patients admitted to the hospital, checkout time is 11:00 AM the day of discharge.   Patients discharged the day of surgery will not be allowed to drive home.  Name and phone number of your driver:NA  Special Instructions: CHG Shower Use Special Wash: 1/2 bottle night before surgery and 1/2 bottle morning of surgery.   Please read over the following fact sheets that you were given: Pain Booklet, Coughing and Deep Breathing and Surgical Site Infection Prevention

## 2011-10-17 NOTE — Progress Notes (Signed)
Received a call from [name] at [facility] heart device clinic asking [place] to re-request pacemaker orders which I did now.

## 2011-10-17 NOTE — Progress Notes (Signed)
Called [facility] and left a message with Shakeela in the device clinic letting her know that we have not yet received the perioperative prescription for implanted cardiac device programming orders.  (Originally faxed 8/5)

## 2011-10-17 NOTE — Consult Note (Signed)
Anesthesia Chart Review:  Patient is a 77 year old female posted for radiology exam (stent placement) under anesthesia.  Orders are pending.  IR evaluation note from 06/13/11 states patient has restenosis of a previously placed right MCA stent.    History includes post-operative N/V, non-smoker, obesity, goiter, HTN, CVA, DM2, familial tremor, right breast CA s/p mastectomy, sinus node dysfunction with history of left BBB s/p dual chamber St. Jude PPM in 03/2011.  Cardiologist is Dr. Royann Shivers [facility]).    EKG on 06/24/11 showed AV pacing.  Echo on 05/21/11 showed: - Left ventricle: The cavity size was normal. There was severe concentric hypertrophy. Systolic function was normal. The estimated ejection fraction was in the range of 55% to 60%. Doppler parameters are consistent with abnormal left ventricular relaxation (grade 1 diastolic dysfunction). - Ventricular septum: Septal motion showed abnormal function, dyssynergy, and paradox. These changes are consistent with right ventricular pacing. - Aortic valve: Trivial regurgitation. - Mitral valve: Calcified annulus. Mild to moderate regurgitation directed centrally. Diastolic regurgitation was present, consistent with long AV delay. - Left atrium: The atrium was mildly to moderately dilated. - Atrial septum: No defect or patent foramen ovale was Identified.  Cardiac cath conclusion from 07/24/10 stated: Mrs. Lizama does not have any evidence of coronary artery  disease and in fact has minimal if any coronary atherosclerotic changes. There is no evidence to support a recent acute atherothrombotic coronary syndrome.  CXR on 05/20/11 showed: Stable prominent cardiomediastinal contours.  Mild left lung base scarring versus atelectasis.   Labs noted.  Patient's PPM perioperative Rx from is still pending from [facility].  Short Stay nursing staff will follow-up.  Anticipate she can proceed from an Anesthesia standpoint.  Shonna Chock, PA-C

## 2011-10-18 NOTE — Progress Notes (Addendum)
Spoke with Phenise at [facility], requested that the device order that has been faxed 2x's be signed & faxed to [facility]- ext [phone]. She agrees to communicate the same with the nurse in the Device clinic.

## 2011-10-22 ENCOUNTER — Encounter (HOSPITAL_COMMUNITY): Payer: Self-pay | Admitting: *Deleted

## 2011-10-22 ENCOUNTER — Inpatient Hospital Stay (HOSPITAL_COMMUNITY)
Admission: RE | Admit: 2011-10-22 | Discharge: 2011-10-23 | DRG: 026 | Disposition: A | Discharge status: Home / Self Care | Payer: Medicare Other | Source: Ambulatory Visit | Attending: Interventional Radiology | Admitting: Interventional Radiology

## 2011-10-22 ENCOUNTER — Encounter (HOSPITAL_COMMUNITY): Payer: Self-pay

## 2011-10-22 ENCOUNTER — Encounter (HOSPITAL_COMMUNITY): Payer: Self-pay | Admitting: Vascular Surgery

## 2011-10-22 ENCOUNTER — Ambulatory Visit (HOSPITAL_COMMUNITY): Payer: Medicare Other | Admitting: Vascular Surgery

## 2011-10-22 ENCOUNTER — Ambulatory Visit (HOSPITAL_COMMUNITY)
Admission: RE | Admit: 2011-10-22 | Discharge: 2011-10-22 | Disposition: A | Discharge status: Home / Self Care | Payer: Medicare Other | Source: Ambulatory Visit | Attending: Interventional Radiology | Admitting: Interventional Radiology

## 2011-10-22 ENCOUNTER — Encounter (HOSPITAL_COMMUNITY)
Admission: RE | Disposition: A | Discharge status: Home / Self Care | Payer: Self-pay | Source: Ambulatory Visit | Attending: Interventional Radiology

## 2011-10-22 DIAGNOSIS — Y92009 Unspecified place in unspecified non-institutional (private) residence as the place of occurrence of the external cause: Secondary | ICD-10-CM

## 2011-10-22 DIAGNOSIS — R739 Hyperglycemia, unspecified: Secondary | ICD-10-CM

## 2011-10-22 DIAGNOSIS — I447 Left bundle-branch block, unspecified: Secondary | ICD-10-CM

## 2011-10-22 DIAGNOSIS — Z853 Personal history of malignant neoplasm of breast: Secondary | ICD-10-CM

## 2011-10-22 DIAGNOSIS — Z79899 Other long term (current) drug therapy: Secondary | ICD-10-CM

## 2011-10-22 DIAGNOSIS — E119 Type 2 diabetes mellitus without complications: Secondary | ICD-10-CM | POA: Diagnosis present

## 2011-10-22 DIAGNOSIS — Z7982 Long term (current) use of aspirin: Secondary | ICD-10-CM

## 2011-10-22 DIAGNOSIS — Z8673 Personal history of transient ischemic attack (TIA), and cerebral infarction without residual deficits: Secondary | ICD-10-CM | POA: Insufficient documentation

## 2011-10-22 DIAGNOSIS — I6609 Occlusion and stenosis of unspecified middle cerebral artery: Secondary | ICD-10-CM

## 2011-10-22 DIAGNOSIS — Z7902 Long term (current) use of antithrombotics/antiplatelets: Secondary | ICD-10-CM

## 2011-10-22 DIAGNOSIS — E871 Hypo-osmolality and hyponatremia: Secondary | ICD-10-CM

## 2011-10-22 DIAGNOSIS — Z950 Presence of cardiac pacemaker: Secondary | ICD-10-CM

## 2011-10-22 DIAGNOSIS — T82898A Other specified complication of vascular prosthetic devices, implants and grafts, initial encounter: Secondary | ICD-10-CM | POA: Diagnosis present

## 2011-10-22 DIAGNOSIS — I10 Essential (primary) hypertension: Secondary | ICD-10-CM | POA: Insufficient documentation

## 2011-10-22 DIAGNOSIS — I771 Stricture of artery: Secondary | ICD-10-CM

## 2011-10-22 DIAGNOSIS — R531 Weakness: Secondary | ICD-10-CM

## 2011-10-22 DIAGNOSIS — R5383 Other fatigue: Secondary | ICD-10-CM

## 2011-10-22 DIAGNOSIS — I440 Atrioventricular block, first degree: Secondary | ICD-10-CM

## 2011-10-22 DIAGNOSIS — R1110 Vomiting, unspecified: Secondary | ICD-10-CM

## 2011-10-22 DIAGNOSIS — Y834 Other reconstructive surgery as the cause of abnormal reaction of the patient, or of later complication, without mention of misadventure at the time of the procedure: Secondary | ICD-10-CM | POA: Diagnosis present

## 2011-10-22 DIAGNOSIS — I639 Cerebral infarction, unspecified: Secondary | ICD-10-CM

## 2011-10-22 DIAGNOSIS — I672 Cerebral atherosclerosis: Principal | ICD-10-CM | POA: Diagnosis present

## 2011-10-22 DIAGNOSIS — Z972 Presence of dental prosthetic device (complete) (partial): Secondary | ICD-10-CM

## 2011-10-22 DIAGNOSIS — R112 Nausea with vomiting, unspecified: Secondary | ICD-10-CM | POA: Diagnosis not present

## 2011-10-22 DIAGNOSIS — E860 Dehydration: Secondary | ICD-10-CM

## 2011-10-22 DIAGNOSIS — R001 Bradycardia, unspecified: Secondary | ICD-10-CM

## 2011-10-22 DIAGNOSIS — E785 Hyperlipidemia, unspecified: Secondary | ICD-10-CM | POA: Insufficient documentation

## 2011-10-22 DIAGNOSIS — R42 Dizziness and giddiness: Secondary | ICD-10-CM

## 2011-10-22 DIAGNOSIS — Z973 Presence of spectacles and contact lenses: Secondary | ICD-10-CM

## 2011-10-22 DIAGNOSIS — G250 Essential tremor: Secondary | ICD-10-CM | POA: Diagnosis present

## 2011-10-22 DIAGNOSIS — N390 Urinary tract infection, site not specified: Secondary | ICD-10-CM

## 2011-10-22 LAB — CBC WITH DIFFERENTIAL/PLATELET
Eosinophils Absolute: 0.2 10*3/uL (ref 0.0–0.7)
Eosinophils Relative: 4 % (ref 0–5)
HCT: 35.6 % — ABNORMAL LOW (ref 36.0–46.0)
Hemoglobin: 12.7 g/dL (ref 12.0–15.0)
Lymphocytes Relative: 22 % (ref 12–46)
Lymphs Abs: 1.1 10*3/uL (ref 0.7–4.0)
MCH: 31.5 pg (ref 26.0–34.0)
MCV: 88.3 fL (ref 78.0–100.0)
Monocytes Relative: 12 % (ref 3–12)
RBC: 4.03 MIL/uL (ref 3.87–5.11)
WBC: 5 10*3/uL (ref 4.0–10.5)

## 2011-10-22 LAB — POCT ACTIVATED CLOTTING TIME
Activated Clotting Time: 164 seconds
Activated Clotting Time: 169 seconds

## 2011-10-22 LAB — APTT: aPTT: 37 seconds (ref 24–37)

## 2011-10-22 LAB — GLUCOSE, CAPILLARY
Glucose-Capillary: 148 mg/dL — ABNORMAL HIGH (ref 70–99)
Glucose-Capillary: 224 mg/dL — ABNORMAL HIGH (ref 70–99)
Glucose-Capillary: 141 mg/dL — ABNORMAL HIGH (ref 70–99)

## 2011-10-22 LAB — BASIC METABOLIC PANEL
CO2: 28 mEq/L (ref 19–32)
Chloride: 93 mEq/L — ABNORMAL LOW (ref 96–112)
Glucose, Bld: 96 mg/dL (ref 70–99)
Potassium: 4.4 mEq/L (ref 3.5–5.1)
Sodium: 130 mEq/L — ABNORMAL LOW (ref 135–145)

## 2011-10-22 LAB — PROTIME-INR: Prothrombin Time: 14.3 seconds (ref 11.6–15.2)

## 2011-10-22 SURGERY — RADIOLOGY WITH ANESTHESIA
Anesthesia: General

## 2011-10-22 MED ORDER — NICARDIPINE HCL IN NACL 20-0.86 MG/200ML-% IV SOLN
5.0000 mg/h | INTRAVENOUS | Status: DC
  Administered 2011-10-22: 5 mg/h via INTRAVENOUS
  Administered 2011-10-22: 2.5 mg/h via INTRAVENOUS
  Filled 2011-10-22 (×3): qty 200

## 2011-10-22 MED ORDER — HEPARIN (PORCINE) IN NACL 100-0.45 UNIT/ML-% IJ SOLN
500.0000 [IU]/h | INTRAMUSCULAR | Status: DC
  Administered 2011-10-22: 500 [IU]/h via INTRAVENOUS
  Filled 2011-10-22 (×2): qty 250

## 2011-10-22 MED ORDER — ROCURONIUM BROMIDE 100 MG/10ML IV SOLN
INTRAVENOUS | Status: DC | PRN
  Administered 2011-10-22: 50 mg via INTRAVENOUS
  Administered 2011-10-22 (×2): 10 mg via INTRAVENOUS

## 2011-10-22 MED ORDER — LIDOCAINE HCL (CARDIAC) 20 MG/ML IV SOLN
INTRAVENOUS | Status: DC | PRN
  Administered 2011-10-22: 70 mg via INTRAVENOUS

## 2011-10-22 MED ORDER — ACETAMINOPHEN 500 MG PO TABS: 1000.0000 mg | ORAL_TABLET | Freq: Four times a day (QID) | ORAL | Status: DC | PRN

## 2011-10-22 MED ORDER — NIMODIPINE 30 MG PO CAPS
60.0000 mg | ORAL_CAPSULE | ORAL | Status: DC
  Filled 2011-10-22: qty 2

## 2011-10-22 MED ORDER — FENTANYL CITRATE 0.05 MG/ML IJ SOLN
INTRAMUSCULAR | Status: DC | PRN
  Administered 2011-10-22: 50 ug via INTRAVENOUS
  Administered 2011-10-22: 100 ug via INTRAVENOUS

## 2011-10-22 MED ORDER — CLOPIDOGREL BISULFATE 75 MG PO TABS
75.0000 mg | ORAL_TABLET | Freq: Every day | ORAL | Status: DC
  Administered 2011-10-23: 75 mg via ORAL
  Filled 2011-10-22 (×2): qty 1

## 2011-10-22 MED ORDER — HEPARIN SODIUM (PORCINE) 1000 UNIT/ML IJ SOLN
INTRAMUSCULAR | Status: DC | PRN
  Administered 2011-10-22: 3000 [IU] via INTRAVENOUS
  Administered 2011-10-22 (×3): 500 [IU] via INTRAVENOUS

## 2011-10-22 MED ORDER — LABETALOL HCL 5 MG/ML IV SOLN
INTRAVENOUS | Status: DC | PRN
  Administered 2011-10-22: 15 mg via INTRAVENOUS
  Administered 2011-10-22: 5 mg via INTRAVENOUS
  Administered 2011-10-22: 10 mg via INTRAVENOUS

## 2011-10-22 MED ORDER — ONDANSETRON HCL 4 MG/2ML IJ SOLN
INTRAMUSCULAR | Status: DC | PRN
  Administered 2011-10-22: 4 mg via INTRAVENOUS

## 2011-10-22 MED ORDER — INSULIN ASPART 100 UNIT/ML ~~LOC~~ SOLN: 0.0000 [IU] | Freq: Every day | SUBCUTANEOUS | Status: DC

## 2011-10-22 MED ORDER — DROPERIDOL 2.5 MG/ML IJ SOLN
INTRAMUSCULAR | Status: DC | PRN
  Administered 2011-10-22: 0.625 mg via INTRAVENOUS

## 2011-10-22 MED ORDER — NEOSTIGMINE METHYLSULFATE 1 MG/ML IJ SOLN
INTRAMUSCULAR | Status: DC | PRN
  Administered 2011-10-22: 3 mg via INTRAVENOUS

## 2011-10-22 MED ORDER — GLYCOPYRROLATE 0.2 MG/ML IJ SOLN
INTRAMUSCULAR | Status: DC | PRN
  Administered 2011-10-22: .4 mg via INTRAVENOUS

## 2011-10-22 MED ORDER — LACTATED RINGERS IV SOLN
INTRAVENOUS | Status: DC | PRN
  Administered 2011-10-22 (×2): via INTRAVENOUS

## 2011-10-22 MED ORDER — ACETAMINOPHEN 650 MG RE SUPP: 650.0000 mg | Freq: Four times a day (QID) | RECTAL | Status: DC | PRN

## 2011-10-22 MED ORDER — ASPIRIN 325 MG PO TABS
325.0000 mg | ORAL_TABLET | Freq: Every day | ORAL | Status: DC
  Administered 2011-10-23: 325 mg via ORAL
  Filled 2011-10-22 (×2): qty 1

## 2011-10-22 MED ORDER — FENTANYL CITRATE 0.05 MG/ML IJ SOLN: 25.0000 ug | INTRAMUSCULAR | Status: DC | PRN

## 2011-10-22 MED ORDER — HEPARIN (PORCINE) IN NACL 100-0.45 UNIT/ML-% IJ SOLN
800.0000 [IU]/h | INTRAMUSCULAR | Status: AC
  Administered 2011-10-22: 800 [IU]/h via INTRAVENOUS
  Filled 2011-10-22: qty 250

## 2011-10-22 MED ORDER — INSULIN ASPART 100 UNIT/ML ~~LOC~~ SOLN
0.0000 [IU] | Freq: Three times a day (TID) | SUBCUTANEOUS | Status: DC
  Administered 2011-10-22: 3 [IU] via SUBCUTANEOUS
  Administered 2011-10-23 (×2): 2 [IU] via SUBCUTANEOUS

## 2011-10-22 MED ORDER — CEFAZOLIN SODIUM-DEXTROSE 2-3 GM-% IV SOLR
2.0000 g | INTRAVENOUS | Status: AC
  Administered 2011-10-22: 2 g via INTRAVENOUS
  Filled 2011-10-22: qty 50

## 2011-10-22 MED ORDER — PHENYLEPHRINE HCL 10 MG/ML IJ SOLN
10.0000 mg | INTRAVENOUS | Status: DC | PRN
  Administered 2011-10-22: 13:00:00 via INTRAVENOUS
  Administered 2011-10-22: 50 ug/min via INTRAVENOUS

## 2011-10-22 MED ORDER — HYDRALAZINE HCL 20 MG/ML IJ SOLN
INTRAMUSCULAR | Status: DC | PRN
  Administered 2011-10-22: 10 mg via INTRAVENOUS

## 2011-10-22 MED ORDER — SODIUM CHLORIDE 0.9 % IV SOLN
INTRAVENOUS | Status: AC
  Administered 2011-10-22: 16:00:00 via INTRAVENOUS

## 2011-10-22 MED ORDER — ONDANSETRON HCL 4 MG/2ML IJ SOLN: 4.0000 mg | Freq: Four times a day (QID) | INTRAMUSCULAR | Status: DC | PRN

## 2011-10-22 MED ORDER — SODIUM CHLORIDE 0.9 % IV SOLN: INTRAVENOUS | Status: DC

## 2011-10-22 MED ORDER — SODIUM CHLORIDE 0.9 % IJ SOLN
1.5000 mg | INTRAVENOUS | Status: DC
  Filled 2011-10-22: qty 0.3

## 2011-10-22 MED ORDER — PROMETHAZINE HCL 25 MG/ML IJ SOLN
INTRAMUSCULAR | Status: AC
  Filled 2011-10-22: qty 1

## 2011-10-22 MED ORDER — PROPOFOL 10 MG/ML IV EMUL
INTRAVENOUS | Status: DC | PRN
  Administered 2011-10-22: 160 mg via INTRAVENOUS

## 2011-10-22 MED ORDER — CLOPIDOGREL BISULFATE 75 MG PO TABS
75.0000 mg | ORAL_TABLET | ORAL | Status: AC
  Administered 2011-10-22: 75 mg via ORAL
  Filled 2011-10-22: qty 1

## 2011-10-22 MED ORDER — IOHEXOL 300 MG/ML  SOLN
300.0000 mL | Freq: Once | INTRAMUSCULAR | Status: AC | PRN
  Administered 2011-10-22: 100 mL via INTRA_ARTERIAL

## 2011-10-22 MED ORDER — ASPIRIN EC 325 MG PO TBEC
325.0000 mg | DELAYED_RELEASE_TABLET | ORAL | Status: AC
  Administered 2011-10-22: 325 mg via ORAL
  Filled 2011-10-22: qty 1

## 2011-10-22 NOTE — H&P (Signed)
Chief Complaint: "I'm here for another angiogram" HPI: [name] is an 77 y.o. female who had just had a cerebral angio for follow up of MCA stenting earlier this year. She had evidence of recurrent stenosis and is now scheduled for repeat intervention. She otherwise feels ok. No recent fevers, illnesses or complaints since we saw her last week. She had no complications from her diagnostic angio last week.  Past Medical History:  Past Medical History  Diagnosis Date  . Goiter   . Arthritis   . Dizziness - light-headed   . Family history of breast cancer     Did not specify what member of family per history form dated 02/15/10.  . Menopause   . Slow heart rate   . Hyperlipidemia   . PONV (postoperative nausea and vomiting)   . Hypertension     dr croitoru   SE  . Pacemaker     st jude  . Stroke     TIA per patient history form dated 02/15/10.  . Diabetes mellitus     Type 2  . Cancer     rt. breast ca  . Family history of anesthesia complication     Daughter Nausea/Vomitting  . Depression   . Familial tremor     takes Inderal  (tremor of head)    Past Surgical History:  Past Surgical History  Procedure Date  . Joint replacement     Knee - ask patient to clarify which knee or both.  . Brain surgery     Stent placement  . Gallbladder surgery   . Mastectomy   . Thyroid surgery     due to Goiter  . Cholecystectomy   . Total knee arthroplasty 2008    left  . Breast surgery 2009    lumpectomy  . Cardiac catheterization   . Colonscopy     Family History:  Family History  Problem Relation Age of Onset  . COPD Mother   . Cancer Father     lung, colon  . Cancer Maternal Aunt     breast  . Cancer Maternal Grandmother     breast    Social History:  reports that she has never smoked. She does not have any smokeless tobacco history on file. She reports that she does not drink alcohol or use illicit drugs.  Allergies:  Allergies  Allergen Reactions  . Oxycodone  Hcl Er Nausea And Vomiting  . Crestor (Rosuvastatin Calcium) Other (See Comments)    dizziness  . Statins     Cramping in legs  . Zetia (Ezetimibe) Other (See Comments)    dizziness    Medications: Current outpatient prescriptions:aspirin 81 MG EC tablet, Take 81 mg by mouth every morning., Disp: , Rfl: ; Calcium Citrate-Vitamin D (CALCIUM CITRATE + PO), Take 1 tablet by mouth every evening., Disp: , Rfl: ; Cholecalciferol (VITAMIN D) 2000 UNITS tablet, Take 2,000 Units by mouth every morning. , Disp: , Rfl: ; citalopram (CELEXA) 20 MG tablet, Take 10 mg by mouth 2 (two) times daily. , Disp: , Rfl:  clopidogrel (PLAVIX) 75 MG tablet, Take 75 mg by mouth every morning., Disp: , Rfl: ; Cyanocobalamin (VITAMIN B-12) 2500 MCG SUBL, Place 2,500 mcg under the tongue every morning. , Disp: , Rfl: ; fish oil-omega-3 fatty acids 1000 MG capsule, Take 1 g by mouth every morning., Disp: , Rfl: ; glipiZIDE (GLUCOTROL XL) 10 MG 24 hr tablet, Take 10 mg by mouth daily with breakfast. , Disp: ,  Rfl:  hydrochlorothiazide (HYDRODIURIL) 25 MG tablet, Take 12.5 mg by mouth every morning. , Disp: , Rfl: ; letrozole (FEMARA) 2.5 MG tablet, Take 2.5 mg by mouth every morning., Disp: , Rfl: ; lisinopril (PRINIVIL,ZESTRIL) 10 MG tablet, Take 10 mg by mouth every evening. , Disp: , Rfl: ; magnesium gluconate (MAGONATE) 500 MG tablet, Take 500 mg by mouth every morning., Disp: , Rfl:  metFORMIN (GLUCOPHAGE) 500 MG tablet, Take 500 mg by mouth 2 (two) times daily. , Disp: , Rfl: ; Multiple Vitamin (MULTIVITAMIN WITH MINERALS) TABS, Take 1 tablet by mouth every morning., Disp: , Rfl: ; propranolol ER (INDERAL LA) 60 MG 24 hr capsule, Take 60 mg by mouth every morning., Disp: , Rfl:    Please HPI for pertinent positives, otherwise complete 10 system ROS negative.  Physical Exam: There were no vitals taken for this visit. There is no height or weight on file to calculate BMI.   General Appearance:  Alert, cooperative, no  distress, appears stated age  Head:  Normocephalic, without obvious abnormality, atraumatic  ENT: Unremarkable  Neck: Supple, symmetrical, trachea midline, no adenopathy, thyroid: not enlarged, symmetric, no tenderness/mass/nodules  Lungs:   Clear to auscultation bilaterally, no w/r/r, respirations unlabored without use of accessory muscles.  Heart:  Regular rate and rhythm, S1, S2 normal, no murmur, rub or gallop. Carotids 2+ without bruit.  Abdomen:   Soft, non-tender, non distended. Bowel sounds active all four quadrants,  no masses, no organomegaly.  Extremities: Extremities normal, atraumatic, no cyanosis or edema  Pulses: 2+ and symmetric femorals  Neurologic: Normal affect, no gross deficits. Speech ok. Tongue midline. Face symm, no UE drift, EOMI, Finger to nose nl, Strength 5/5 throughout, sens intact.   Results for orders placed during the hospital encounter of 10/22/11 (from the past 48 hour(s))  GLUCOSE, CAPILLARY     Status: Abnormal   Collection Time   10/22/11  7:31 AM      Component Value Range Comment   Glucose-Capillary 104 (*) 70 - 99 mg/dL   CBC WITH DIFFERENTIAL     Status: Abnormal   Collection Time   10/22/11  7:39 AM      Component Value Range Comment   WBC 5.0  4.0 - 10.5 K/uL    RBC 4.03  3.87 - 5.11 MIL/uL    Hemoglobin 12.7  12.0 - 15.0 g/dL    HCT 16.1 (*) 09.6 - 46.0 %    MCV 88.3  78.0 - 100.0 fL    MCH 31.5  26.0 - 34.0 pg    MCHC 35.7  30.0 - 36.0 g/dL    RDW 04.5  40.9 - 81.1 %    Platelets 204  150 - 400 K/uL    Neutrophils Relative 63  43 - 77 %    Neutro Abs 3.1  1.7 - 7.7 K/uL    Lymphocytes Relative 22  12 - 46 %    Lymphs Abs 1.1  0.7 - 4.0 K/uL    Monocytes Relative 12  3 - 12 %    Monocytes Absolute 0.6  0.1 - 1.0 K/uL    Eosinophils Relative 4  0 - 5 %    Eosinophils Absolute 0.2  0.0 - 0.7 K/uL    Basophils Relative 0  0 - 1 %    Basophils Absolute 0.0  0.0 - 0.1 K/uL    No results found.  Assessment/Plan Recurrent MCA  stenosis For arteriogram with repeat angioplasty today. Plan for 23hr observation admission  after procedure. Procedure reviewed with pt and family, including risks and complications. Labs pending. Consent signed in chart.  [name] PA-C 10/22/2011, 7:55 AM

## 2011-10-22 NOTE — Progress Notes (Signed)
Report received from Merril Abbe CRNA post angioplasty of Rt MCA.  BP per Dr. Corliss Skains are SBP 110-130 mmHg per Left radial arterial line.

## 2011-10-22 NOTE — Progress Notes (Signed)
ANTICOAGULATION CONSULT NOTE - Initial Consult  Pharmacy Consult for Heparin Indication: s/p MCA angioplasty  Allergies  Allergen Reactions  . Oxycodone Hcl Er Nausea And Vomiting  . Crestor (Rosuvastatin Calcium) Other (See Comments)    dizziness  . Statins     Cramping in legs  . Zetia (Ezetimibe) Other (See Comments)    dizziness    Patient Measurements: Height: 5' 6.14" (168 cm) Weight: 219 lb 5.7 oz (99.5 kg) IBW/kg (Calculated) : 59.63  Heparin Dosing Weight: 81 kg  Vital Signs: Temp: 97.8 F (36.6 C) (08/12 1515) Temp src: Oral (08/12 0722) BP: 106/59 mmHg (08/12 1505) Pulse Rate: 65  (08/12 1515)  Labs:  [facility] 10/22/11 0739  HGB 12.7  HCT 35.6*  PLT 204  APTT 37  LABPROT 14.3  INR 1.09  HEPARINUNFRC --  CREATININE 0.95  CKTOTAL --  CKMB --  TROPONINI --    Estimated Creatinine Clearance: 61.1 ml/min (by C-G formula based on Cr of 0.95).   Medical History: Past Medical History  Diagnosis Date  . Goiter   . Arthritis   . Dizziness - light-headed   . Family history of breast cancer     Did not specify what member of family per history form dated 02/15/10.  . Menopause   . Slow heart rate   . Hyperlipidemia   . PONV (postoperative nausea and vomiting)   . Hypertension     dr croitoru   SE  . Pacemaker     st jude  . Stroke     TIA per patient history form dated 02/15/10.  . Diabetes mellitus     Type 2  . Cancer     rt. breast ca  . Family history of anesthesia complication     Daughter Nausea/Vomitting  . Depression   . Familial tremor     takes Inderal  (tremor of head)    Medications:  Scheduled:    . aspirin  325 mg Oral 60 min Pre-Op  .  ceFAZolin (ANCEF) IV  2 g Intravenous 60 min Pre-Op  . clopidogrel  75 mg Oral 60 min Pre-Op  . insulin aspart  0-5 Units Subcutaneous QHS  . insulin aspart  0-9 Units Subcutaneous TID WC  . DISCONTD: niMODipine  60 mg Oral 60 min Pre-Op    Assessment: 77 yr old female admitted for  repeat intervention of recurrent stenosis of her previous MCA stenting. Goal of Therapy:  Heparin level 0.1-0.25 Monitor platelets by anticoagulation protocol: Yes   Plan:  Heparin was started in the recovery room by MD at 500 units/hr. Will increase rate to 10 units/kg of heparin dosing weight which is 800 units/hr. Will check heparin level in 8 hrs.  Eugene Garnet 10/22/2011,3:26 PM

## 2011-10-22 NOTE — Procedures (Signed)
S/P RT MCA angioplasty for severe pre occlusive stenosis.

## 2011-10-22 NOTE — Anesthesia Postprocedure Evaluation (Signed)
  Anesthesia Post-op Note  Patient: [name]  Procedure(s) Performed: Procedure(s) (LRB): RADIOLOGY WITH ANESTHESIA (N/A)  Patient Location: PACU  Anesthesia Type: General  Level of Consciousness: awake  Airway and Oxygen Therapy: Patient Spontanous Breathing and Patient connected to nasal cannula oxygen  Post-op Pain: mild  Post-op Assessment: Post-op Vital signs reviewed, Patient's Cardiovascular Status Stable, Respiratory Function Stable, Patent Airway and NAUSEA AND VOMITING PRESENT  Post-op Vital Signs: Reviewed and stable  Complications: No apparent anesthesia complications

## 2011-10-22 NOTE — Anesthesia Preprocedure Evaluation (Signed)
Anesthesia Evaluation  Patient identified by MRN, date of birth, ID band Patient awake    Reviewed: Allergy & Precautions, H&P , NPO status , Patient's Chart, lab work & pertinent test results  History of Anesthesia Complications (+) PONV  Airway Mallampati: II  Neck ROM: full    Dental   Pulmonary          Cardiovascular hypertension, + pacemaker     Neuro/Psych PSYCHIATRIC DISORDERS Depression CVA    GI/Hepatic   Endo/Other  Type 2obese  Renal/GU      Musculoskeletal  (+) Arthritis -,   Abdominal   Peds  Hematology   Anesthesia Other Findings   Reproductive/Obstetrics                           Anesthesia Physical Anesthesia Plan  ASA: III  Anesthesia Plan: General   Post-op Pain Management:    Induction: Intravenous  Airway Management Planned: Oral ETT  Additional Equipment:   Intra-op Plan:   Post-operative Plan: Extubation in OR  Informed Consent: I have reviewed the patients History and Physical, chart, labs and discussed the procedure including the risks, benefits and alternatives for the proposed anesthesia with the patient or authorized representative who has indicated his/her understanding and acceptance.     Plan Discussed with: CRNA and Surgeon  Anesthesia Plan Comments:         Anesthesia Quick Evaluation

## 2011-10-22 NOTE — Progress Notes (Signed)
NIMODIPINE HELD DUE TO BP 119/82 LESS THAN PARAMETER. HOLD IFSYSTOLIC LESS THAN 120.

## 2011-10-22 NOTE — Transfer of Care (Signed)
Immediate Anesthesia Transfer of Care Note  Patient: [name]  Procedure(s) Performed: Procedure(s) (LRB): RADIOLOGY WITH ANESTHESIA (N/A)  Patient Location: PACU  Anesthesia Type: General  Level of Consciousness: awake and oriented  Airway & Oxygen Therapy: Patient Spontanous Breathing and Patient connected to nasal cannula oxygen  Post-op Assessment: Report given to PACU RN, Post -op Vital signs reviewed and stable and Patient moving all extremities  Post vital signs: Reviewed and stable  Complications: No apparent anesthesia complications

## 2011-10-22 NOTE — Preoperative (Signed)
Beta Blockers   Reason not to administer Beta Blockers:Propanolol @ 0620hrs

## 2011-10-22 NOTE — Progress Notes (Signed)
Day of Surgery  Subjective: R MCA stenosis; PTA with Dr Roselind Messier today Pt tolerated well Has has post op n/v No vomit x 3.5 hrs now Still sl nausea; resting with dtr at bedside BP stable  Objective: Vital signs in last 24 hours: Temp:  [97.6 F (36.4 C)-98.3 F (36.8 C)] 98.3 F (36.8 C) (08/12 1600) Pulse Rate:  [60-72] 64  (08/12 1600) Resp:  [13-26] 13  (08/12 1600) BP: (99-119)/(54-82) 106/59 mmHg (08/12 1505) SpO2:  [95 %-98 %] 97 % (08/12 1600) Arterial Line BP: (106-142)/(42-57) 106/42 mmHg (08/12 1600) Weight:  [219 lb 5.7 oz (99.5 kg)-219 lb 12.8 oz (99.7 kg)] 219 lb 12.8 oz (99.7 kg) (08/12 1600)    Intake/Output from previous day:   Intake/Output this shift: Total I/O In: 1250 [I.V.:1250] Out: 910 [Urine:910]  PE:  Afeb; BP stable; VSS Rt groin NT; no bleeding; NT No hematoma Good movement of all 4s; slow 2+ pulses Rt foot Opens eyes; follows me in room Shows teeth; puffs cheeks Almost 1 liter output so far; yellow  Lab Results:   [facility] 10/22/11 0739  WBC 5.0  HGB 12.7  HCT 35.6*  PLT 204   BMET  Basename 10/22/11 0739  NA 130*  K 4.4  CL 93*  CO2 28  GLUCOSE 96  BUN 17  CREATININE 0.95  CALCIUM 9.7   PT/INR  Basename 10/22/11 0739  LABPROT 14.3  INR 1.09   ABG No results found for this basename: PHART:2,PCO2:2,PO2:2,HCO3:2 in the last 72 hours  Studies/Results: No results found.  Anti-infectives: Anti-infectives     Start     Dose/Rate Route Frequency Ordered Stop   10/22/11 0865   ceFAZolin (ANCEF) IVPB 2 g/50 mL premix        2 g 100 mL/hr over 30 Minutes Intravenous 60 min pre-op 10/22/11 7846 10/22/11 1107          Assessment/Plan: s/p Procedure(s) (LRB): RADIOLOGY WITH ANESTHESIA (N/A)  Right Middle Cerebral Artery stenosis Angioplasty with Dr Corliss Skains Pt doing well post op Still with some nausea; but better No vomit x 3.5 hrs now Will recheck in am; Dr Corliss Skains on call Plan to pull sheath in am; dc  later in day Dr Corliss Skains examined pt also   Talonda Artist A 10/22/2011

## 2011-10-23 LAB — CBC WITH DIFFERENTIAL/PLATELET
Basophils Absolute: 0 10*3/uL (ref 0.0–0.1)
Basophils Relative: 0 % (ref 0–1)
Eosinophils Absolute: 0.1 10*3/uL (ref 0.0–0.7)
MCH: 31.1 pg (ref 26.0–34.0)
MCHC: 34.7 g/dL (ref 30.0–36.0)
Neutro Abs: 5 10*3/uL (ref 1.7–7.7)
Neutrophils Relative %: 74 % (ref 43–77)
Platelets: 180 10*3/uL (ref 150–400)
RDW: 12.4 % (ref 11.5–15.5)

## 2011-10-23 LAB — BASIC METABOLIC PANEL
Chloride: 94 mEq/L — ABNORMAL LOW (ref 96–112)
GFR calc Af Amer: 80 mL/min — ABNORMAL LOW (ref >90)
GFR calc non Af Amer: 69 mL/min — ABNORMAL LOW (ref >90)
Potassium: 4 mEq/L (ref 3.5–5.1)

## 2011-10-23 LAB — HEPARIN LEVEL (UNFRACTIONATED): Heparin Unfractionated: <0.1 IU/mL — ABNORMAL LOW (ref 0.30–0.70)

## 2011-10-23 LAB — GLUCOSE, CAPILLARY: Glucose-Capillary: 154 mg/dL — ABNORMAL HIGH (ref 70–99)

## 2011-10-23 MED ORDER — KETOROLAC TROMETHAMINE 30 MG/ML IJ SOLN
30.0000 mg | Freq: Once | INTRAMUSCULAR | Status: AC
  Administered 2011-10-23: 30 mg via INTRAVENOUS
  Filled 2011-10-23: qty 1

## 2011-10-23 NOTE — Progress Notes (Signed)
Patient ambulated around unit x 1 without difficulty. Patient given discharge instructions and teach back education method used. Patient verbalizes understanding of instructions and when to call MD. PIV removed and patient discharged home via wheelchair with daughter.

## 2011-10-23 NOTE — Progress Notes (Signed)
A 42fr sheath was removed at 0854, a vpad placed was over site with manual pressure applied to achieve hemostasis. Pulses to anterior and posterior tibial  were present prior to sheath being removed. Hemostasis achieved at 09:18. Pulses to anterior and posterior tibials were present post hemostasis. The vpad was left in place over the access site and dressed with gauze and tegadurm. A 10 pound sand bag was placed over site.

## 2011-10-23 NOTE — Progress Notes (Signed)
UR completed 

## 2011-10-23 NOTE — Discharge Summary (Signed)
Physician Discharge Summary  Patient ID: [name] MRN: [id number] DOB/AGE: 03-16-1934 77 y.o.  Admit date: 10/22/2011 Discharge date: 10/23/2011  Admission Diagnoses: Rt middle cerebral artery stenosis  Discharge Diagnoses: R MCA stenosis treated with angioplasy  PMH:  DM; Breast Ca; PONV; pacemaker; CVA; familial tremor  Discharged Condition: improved  Hospital Course: Hx Right middle cerebral artery angioplasty/stent 05/2011. Pt had recurrent dizziness and repeat cerebral arteriogram which revealed restenosis.  Cerebral arteriogram with R MCA angioplasty was performed 10/22/11 with Dr Corliss Skains. Pt tolerated procedure well. Did have some post-op nausea and vomiting which resolved within 24 hrs. Overnight stay in Neuro ICU was without event. Pt has had no complaints and has remained stable.  Pt has been examined and plan is to dc home after ambulation. Dr Corliss Skains has seen and examined pt. Sodium level has been chronically low (127-132 since 3/13; documented in computer) with uncertain etiology. She will follow up with Dr Marjory Lies for Chem 7 in 1 week to check sodium and glucose. Pt is not to restart Metformin til 10/24/11.  Consults: none  Significant Diagnostic Studies: Cerebral arteriogram  Treatments: R MCA angioplasty  Discharge Exam: Blood pressure 106/59, pulse 66, temperature 98.3 F (36.8 C), temperature source Oral, resp. rate 11, height 5\' 6"  (1.676 m), weight 219 lb 12.8 oz (99.7 kg), SpO2 99.00%.  PE:  A/O; appropriate Afeb; VSS Face symmetrical; smile = Heart: RRR Lungs: CTA Abd: soft; +BS; NT Extr: moves all 4s; good strength; =; NT Rt groin: NT; no bleeding; no hematoma Clean and dry Rt foot: 2+ pulses Labs all stable Exception of sodium: chronically low   Results for orders placed during the hospital encounter of 10/22/11  APTT      Component Value Range   aPTT 37  24 - 37 seconds  BASIC METABOLIC PANEL      Component Value Range   Sodium 130  (*) 135 - 145 mEq/L   Potassium 4.4  3.5 - 5.1 mEq/L   Chloride 93 (*) 96 - 112 mEq/L   CO2 28  19 - 32 mEq/L   Glucose, Bld 96  70 - 99 mg/dL   BUN 17  6 - 23 mg/dL   Creatinine, Ser 4.09  0.50 - 1.10 mg/dL   Calcium 9.7  8.4 - 81.1 mg/dL   GFR calc non Af Amer 57 (*) >90 mL/min   GFR calc Af Amer 66 (*) >90 mL/min  CBC WITH DIFFERENTIAL      Component Value Range   WBC 5.0  4.0 - 10.5 K/uL   RBC 4.03  3.87 - 5.11 MIL/uL   Hemoglobin 12.7  12.0 - 15.0 g/dL   HCT 91.4 (*) 78.2 - 95.6 %   MCV 88.3  78.0 - 100.0 fL   MCH 31.5  26.0 - 34.0 pg   MCHC 35.7  30.0 - 36.0 g/dL   RDW 21.3  08.6 - 57.8 %   Platelets 204  150 - 400 K/uL   Neutrophils Relative 63  43 - 77 %   Neutro Abs 3.1  1.7 - 7.7 K/uL   Lymphocytes Relative 22  12 - 46 %   Lymphs Abs 1.1  0.7 - 4.0 K/uL   Monocytes Relative 12  3 - 12 %   Monocytes Absolute 0.6  0.1 - 1.0 K/uL   Eosinophils Relative 4  0 - 5 %   Eosinophils Absolute 0.2  0.0 - 0.7 K/uL   Basophils Relative 0  0 - 1 %  Basophils Absolute 0.0  0.0 - 0.1 K/uL  PROTIME-INR      Component Value Range   Prothrombin Time 14.3  11.6 - 15.2 seconds   INR 1.09  0.00 - 1.49  GLUCOSE, CAPILLARY      Component Value Range   Glucose-Capillary 104 (*) 70 - 99 mg/dL  GLUCOSE, CAPILLARY      Component Value Range   Glucose-Capillary 148 (*) 70 - 99 mg/dL  HEPARIN LEVEL (UNFRACTIONATED)      Component Value Range   Heparin Unfractionated <0.10 (*) 0.30 - 0.70 IU/mL  CBC WITH DIFFERENTIAL      Component Value Range   WBC 6.8  4.0 - 10.5 K/uL   RBC 3.50 (*) 3.87 - 5.11 MIL/uL   Hemoglobin 10.9 (*) 12.0 - 15.0 g/dL   HCT 29.5 (*) 62.1 - 30.8 %   MCV 89.7  78.0 - 100.0 fL   MCH 31.1  26.0 - 34.0 pg   MCHC 34.7  30.0 - 36.0 g/dL   RDW 65.7  84.6 - 96.2 %   Platelets 180  150 - 400 K/uL   Neutrophils Relative 74  43 - 77 %   Neutro Abs 5.0  1.7 - 7.7 K/uL   Lymphocytes Relative 16  12 - 46 %   Lymphs Abs 1.1  0.7 - 4.0 K/uL   Monocytes Relative 9  3 - 12  %   Monocytes Absolute 0.6  0.1 - 1.0 K/uL   Eosinophils Relative 1  0 - 5 %   Eosinophils Absolute 0.1  0.0 - 0.7 K/uL   Basophils Relative 0  0 - 1 %   Basophils Absolute 0.0  0.0 - 0.1 K/uL  BASIC METABOLIC PANEL      Component Value Range   Sodium 127 (*) 135 - 145 mEq/L   Potassium 4.0  3.5 - 5.1 mEq/L   Chloride 94 (*) 96 - 112 mEq/L   CO2 26  19 - 32 mEq/L   Glucose, Bld 148 (*) 70 - 99 mg/dL   BUN 14  6 - 23 mg/dL   Creatinine, Ser 9.52  0.50 - 1.10 mg/dL   Calcium 8.8  8.4 - 84.1 mg/dL   GFR calc non Af Amer 69 (*) >90 mL/min   GFR calc Af Amer 80 (*) >90 mL/min  GLUCOSE, CAPILLARY      Component Value Range   Glucose-Capillary 224 (*) 70 - 99 mg/dL   Comment 1 Notify RN     Comment 2 Documented in Chart    GLUCOSE, CAPILLARY      Component Value Range   Glucose-Capillary 141 (*) 70 - 99 mg/dL  GLUCOSE, CAPILLARY      Component Value Range   Glucose-Capillary 126 (*) 70 - 99 mg/dL  GLUCOSE, CAPILLARY      Component Value Range   Glucose-Capillary 172 (*) 70 - 99 mg/dL    Disposition: Right middle cerebral artery stenosis Angioplasty performed 10/22/11 with Dr Corliss Skains Pt tolerated well except PONV - resolved Chronically low sodium: 127 (127-133 since 3/13 per chart) Pt to follow with Dr Marjory Lies for Chem 7 in 1 week Please check glucose at same time Pt may restart Metformin 10/24/11 Follow up with Dr Corliss Skains in 2 weeks: scheduler will call pt with appt date and time Call [phone] with questions or concerns  Discharge Orders    Future Appointments: Provider: Department: Dept Phone: Center:   10/26/2011 4:10 PM Kandis Cocking, MD [facility] [phone]  None   11/28/2011 10:00 AM Radene Gunning [facility] [phone] None   12/05/2011 10:30 AM Pierce Crane, MD [facility] [phone] None     Future Orders Please Complete By Expires   Diet - low sodium heart healthy      Increase activity slowly      Scheduling Instructions:    Restful x 2 days; increase activity gradually; may travel to beach only after 4 days recovery   Comments:   Restful x 2 days; increase activity gradually; may travel to beach only after 4 days recovery   Discharge instructions      Comments:   Follow with Dr Marjory Lies in [place] - need to follow chronically low sodium and glucose; Re: Chem 7 in 1 week with Dr Doristine Counter;  Follow up with Dr Corliss Skains in 2 weeks - scheduler will call with time and date   Driving Restrictions      Comments:   No driving x 2 weeks   Lifting restrictions      Comments:   No lifting over 10 lbs x 2 weeks   Other Restrictions      Comments:   May restart metformin 8/14   Remove dressing in 24 hours      Call MD for:  persistant nausea and vomiting      Call MD for:  severe uncontrolled pain      Call MD for:  redness, tenderness, or signs of infection (pain, swelling, redness, odor or green/yellow discharge around incision site)      Call MD for:  difficulty breathing, headache or visual disturbances      Call MD for:  persistant dizziness or light-headedness        Medication List  As of 10/23/2011  1:43 PM   TAKE these medications         aspirin 81 MG EC tablet   Take 81 mg by mouth every morning.      CALCIUM CITRATE + PO   Take 1 tablet by mouth every evening.      citalopram 20 MG tablet   Commonly known as: CELEXA   Take 10 mg by mouth 2 (two) times daily.      clopidogrel 75 MG tablet   Commonly known as: PLAVIX   Take 75 mg by mouth every morning.      fish oil-omega-3 fatty acids 1000 MG capsule   Take 1 g by mouth every morning.      glipiZIDE 10 MG 24 hr tablet   Commonly known as: GLUCOTROL XL   Take 10 mg by mouth daily with breakfast.      hydrochlorothiazide 25 MG tablet   Commonly known as: HYDRODIURIL   Take 12.5 mg by mouth every morning.      letrozole 2.5 MG tablet   Commonly known as: FEMARA   Take 2.5 mg by mouth every morning.      lisinopril 10 MG tablet     Commonly known as: PRINIVIL,ZESTRIL   Take 10 mg by mouth every evening.      magnesium gluconate 500 MG tablet   Commonly known as: MAGONATE   Take 500 mg by mouth every morning.      metFORMIN 500 MG tablet   Commonly known as: GLUCOPHAGE   Take 500 mg by mouth 2 (two) times daily.      multivitamin with minerals Tabs   Take 1 tablet by mouth every morning.      PHENERGAN PO  Take by mouth. Unsure dose      propranolol ER 60 MG 24 hr capsule   Commonly known as: INDERAL LA   Take 60 mg by mouth every morning.      Vitamin B-12 2500 MCG Subl   Place 2,500 mcg under the tongue every morning.      Vitamin D 2000 UNITS tablet   Take 2,000 Units by mouth every morning.             Signed: Marili Vader A 10/23/2011, 1:43 PM

## 2011-10-23 NOTE — Clinical Documentation Improvement (Signed)
Abnormal Labs Clarification  THIS DOCUMENT IS NOT A PERMANENT PART OF THE MEDICAL RECORD  Please update your documentation within the medical record to reflect your response to this query.                                                                                   10/23/11  Dear Elita Quick,  In a better effort to capture your patient's severity of illness, reflect appropriate length of stay and utilization of resources, a review of the medical record has revealed the following indicators.   Based on your clinical judgment, please clarify and document in a progress note and/or discharge summary the clinical condition associated with the following supporting information: In responding to this query please exercise your independent judgment.  The fact that a query is asked, does not imply that any particular answer is desired or expected.   Abnormal findings (laboratory, x-ray, pathologic, and other diagnostic results) are not coded and reported unless the physician, NP, PA indicates their clinical significance.   The medical record reflects the following clinical findings, please clarify the diagnostic and/or clinical significance:      Component      Sodium  Latest Ref Rng      135 - 145 mEq/L  10/11/2011      132 (L)  10/22/2011      130 (L)  10/23/2011      127 (L)   - 0.9 % sodium chloride infusion @ 55mls/hr   Possible Clinical Conditions?  - Hyponatremia  - Other condition (please document in the progress notes and/or discharge summary)  - Cannot Clinically determine at this time     No additional documentation in chart upon review. SM    Thank You,  Saul Fordyce  Clinical Documentation Specialist: [phone] Pager  [facility] Information [facility] [place]

## 2011-10-23 NOTE — Progress Notes (Addendum)
1 Day Post-Op  Subjective: Right middle cerebral artery stenosis; angioplasty 8/12 with Dr Corliss Skains Pt tolerated well PONV has subsided No vomit since yesterday at 130 pm; No nausea since last pm Tolerating ice and fluids now Feels good; no complaints No headache; some back pain from laying all night  Objective: Vital signs in last 24 hours: Temp:  [97.8 F (36.6 C)-98.9 F (37.2 C)] 98.7 F (37.1 C) (08/13 0400) Pulse Rate:  [60-72] 72  (08/13 0700) Resp:  [10-26] 14  (08/13 0700) BP: (99-106)/(54-59) 106/59 mmHg (08/12 1505) SpO2:  [95 %-100 %] 99 % (08/13 0700) Arterial Line BP: (106-151)/(42-57) 137/50 mmHg (08/13 0700) Weight:  [219 lb 5.7 oz (99.5 kg)-219 lb 12.8 oz (99.7 kg)] 219 lb 12.8 oz (99.7 kg) (08/12 1600)    Intake/Output from previous day: 08/12 0701 - 08/13 0700 In: 2698.6 [P.O.:240; I.V.:2458.6] Out: 1690 [Urine:1690] Intake/Output this shift:    PE:  Afeb; VSS 130s/ 50-60s; cardene restarted this am (BP went up to 140s around 6am) A/O; appropriate Face symmetrical Follows all commands Moves all 4s; = strength Rt groin nt; no bleeding; no hematoma Rt foot: 2+ pulses Hep off Chronically low sodium; hyponatremia  Lab Results:   [facility] 10/23/11 0450 10/22/11 0739  WBC 6.8 5.0  HGB 10.9* 12.7  HCT 31.4* 35.6*  PLT 180 204   BMET  Basename 10/23/11 0450 10/22/11 0739  NA 127* 130*  K 4.0 4.4  CL 94* 93*  CO2 26 28  GLUCOSE 148* 96  BUN 14 17  CREATININE 0.81 0.95  CALCIUM 8.8 9.7   PT/INR  Basename 10/22/11 0739  LABPROT 14.3  INR 1.09   ABG No results found for this basename: PHART:2,PCO2:2,PO2:2,HCO3:2 in the last 72 hours  Studies/Results: No results found.  Anti-infectives: Anti-infectives     Start     Dose/Rate Route Frequency Ordered Stop   10/22/11 0702   ceFAZolin (ANCEF) IVPB 2 g/50 mL premix        2 g 100 mL/hr over 30 Minutes Intravenous 60 min pre-op 10/22/11 0702 10/22/11 1107           Assessment/Plan: s/p Procedure(s) (LRB): RADIOLOGY WITH ANESTHESIA (N/A)  R MCA stenosis; PTA 8/12 Pt has done well; will watch BP today Plan for sheath removal and dc home later today Dr Dev aware of pt status  [name] A 10/23/2011

## 2011-10-26 ENCOUNTER — Ambulatory Visit (INDEPENDENT_AMBULATORY_CARE_PROVIDER_SITE_OTHER): Payer: Medicare Other | Admitting: Surgery

## 2011-10-26 VITALS — BP 170/98 | HR 68 | Temp 97.0°F | Resp 18 | Ht 66.0 in | Wt 220.0 lb

## 2011-10-26 DIAGNOSIS — Z853 Personal history of malignant neoplasm of breast: Secondary | ICD-10-CM

## 2011-10-26 NOTE — Progress Notes (Signed)
[facility]  Ovidio Kin, MD,  FACS [address] [place]  [address] [place], [place]    [address] Phone:  [phone] FAX:  [phone]   Re:   [name] DOB:   1935/03/03 MRN:   [id number]  ASSESSMENT AND PLAN: 1.  Right breast cancer, T3, N1 (7/20 nodes).  Right mastectomy - 02/27/2007.  Femara.  Disease free.  I gave her a 5 year survivor pen.  Return to see me in 1 year.  2.  S/P stroke with some balance issues.  TIA - 02/15/2010.  Stroke - March 2013  Stent placed by Dr. Corliss Skains - just got out of [facility] for trying to re-open the stent 3.  Recent hospitalization for dizziness - Dec 2012.  She's to get a pacemaker by Dr. Royann Shivers next week. 4.  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  Dr. Chip Boer and [facility].  Went to [facility] and sounds like she got an ERCP with sphincterotomy - Jan 2013 5.  Elevated LFT's.  Unknown reason. 6.  Pacemaker - Jan 2013 - Dr. Salena Saner - SE cards  HISTORY OF PRESENT ILLNESS: Chief Complaint  Patient presents with  . Breast Cancer Long Term Follow Up    [name] is a 77 y.o. (DOB: 04-02-1934)  white female who is a patient of BURNETT,BRENT A, MD and comes to me today for folow up of right breast cancer.  She is in good spirits.  Just got out of [facility] for trying to open up the cerebral stent that she has. Has some bruising of the left arm.  No concerns about her breast.  PHYSICAL EXAM: BP 170/98  Pulse 68  Temp 97 F (36.1 C)  Resp 18  Ht 5\' 6"  (1.676 m)  Wt 220 lb (99.791 kg)  BMI 35.51 kg/m2  HEENT:  Pupils equal.  Dentition good.   NECK:  Supple.  No thyroid mass. LYMPH NODES:  No cervical, supraclavicular, or axillary adenopathy. BREASTS -  RIGHT:  Absent, some redundant skin.  She as some neovascularization around mastectomy scar.  No palpable mass or nodule.    LEFT:  No palpable mass or nodule.  No nipple discharge. UPPER EXTREMITIES:  No evidence of lymphedema.  DATA  REVIEWED: Mammograms - 03/07/2011.  Ovidio Kin, MD, FACS Office:  [phone]

## 2011-11-05 ENCOUNTER — Other Ambulatory Visit (HOSPITAL_COMMUNITY): Payer: Self-pay | Admitting: Interventional Radiology

## 2011-11-05 ENCOUNTER — Ambulatory Visit (HOSPITAL_COMMUNITY)
Admit: 2011-11-05 | Discharge: 2011-11-05 | Disposition: A | Discharge status: Home / Self Care | Payer: Medicare Other | Attending: Interventional Radiology | Admitting: Interventional Radiology

## 2011-11-05 DIAGNOSIS — I771 Stricture of artery: Secondary | ICD-10-CM

## 2011-11-28 ENCOUNTER — Other Ambulatory Visit (HOSPITAL_BASED_OUTPATIENT_CLINIC_OR_DEPARTMENT_OTHER): Payer: Medicare Other | Admitting: Lab

## 2011-11-28 ENCOUNTER — Other Ambulatory Visit: Payer: Self-pay | Admitting: Oncology

## 2011-11-28 DIAGNOSIS — Z853 Personal history of malignant neoplasm of breast: Secondary | ICD-10-CM

## 2011-11-28 DIAGNOSIS — E559 Vitamin D deficiency, unspecified: Secondary | ICD-10-CM

## 2011-11-28 LAB — CBC WITH DIFFERENTIAL/PLATELET
BASO%: 0.5 % (ref 0.0–2.0)
HCT: 35 % (ref 34.8–46.6)
MCHC: 35.2 g/dL (ref 31.5–36.0)
MONO#: 0.5 10*3/uL (ref 0.1–0.9)
NEUT#: 5.8 10*3/uL (ref 1.5–6.5)
NEUT%: 78.8 % — ABNORMAL HIGH (ref 38.4–76.8)
WBC: 7.3 10*3/uL (ref 3.9–10.3)
lymph#: 0.8 10*3/uL — ABNORMAL LOW (ref 0.9–3.3)

## 2011-11-28 LAB — COMPREHENSIVE METABOLIC PANEL (CC13)
ALT: 13 U/L (ref 0–55)
CO2: 27 mEq/L (ref 22–29)
Calcium: 10 mg/dL (ref 8.4–10.4)
Chloride: 97 mEq/L — ABNORMAL LOW (ref 98–107)
Sodium: 135 mEq/L — ABNORMAL LOW (ref 136–145)
Total Protein: 6.5 g/dL (ref 6.4–8.3)

## 2011-11-29 LAB — VITAMIN D 25 HYDROXY (VIT D DEFICIENCY, FRACTURES): Vit D, 25-Hydroxy: 43 ng/mL (ref 30–89)

## 2011-12-05 ENCOUNTER — Ambulatory Visit: Payer: Medicare Other | Admitting: Family

## 2012-01-04 ENCOUNTER — Ambulatory Visit: Payer: Medicare Other | Admitting: Oncology

## 2012-01-09 ENCOUNTER — Other Ambulatory Visit: Payer: Self-pay | Admitting: Oncology

## 2012-01-16 ENCOUNTER — Other Ambulatory Visit: Payer: Self-pay | Admitting: *Deleted

## 2012-01-16 DIAGNOSIS — C50919 Malignant neoplasm of unspecified site of unspecified female breast: Secondary | ICD-10-CM

## 2012-01-16 MED ORDER — LETROZOLE 2.5 MG PO TABS: 2.5000 mg | ORAL_TABLET | Freq: Every morning | ORAL | Status: DC

## 2012-01-16 NOTE — Telephone Encounter (Signed)
Neither one of the patient's numbers had an voice message mailed out calendar to inform the patient

## 2012-01-28 ENCOUNTER — Ambulatory Visit (HOSPITAL_BASED_OUTPATIENT_CLINIC_OR_DEPARTMENT_OTHER): Payer: Medicare Other | Admitting: Oncology

## 2012-01-28 ENCOUNTER — Telehealth: Payer: Self-pay | Admitting: *Deleted

## 2012-01-28 VITALS — BP 126/72 | HR 83 | Temp 98.0°F | Resp 20 | Ht 66.0 in | Wt 215.6 lb

## 2012-01-28 DIAGNOSIS — E119 Type 2 diabetes mellitus without complications: Secondary | ICD-10-CM

## 2012-01-28 DIAGNOSIS — C50419 Malignant neoplasm of upper-outer quadrant of unspecified female breast: Secondary | ICD-10-CM

## 2012-01-28 DIAGNOSIS — M8580 Other specified disorders of bone density and structure, unspecified site: Secondary | ICD-10-CM

## 2012-01-28 DIAGNOSIS — M899 Disorder of bone, unspecified: Secondary | ICD-10-CM

## 2012-01-28 DIAGNOSIS — Z170 Estrogen receptor positive status [ER+]: Secondary | ICD-10-CM

## 2012-01-28 DIAGNOSIS — C50919 Malignant neoplasm of unspecified site of unspecified female breast: Secondary | ICD-10-CM

## 2012-01-28 MED ORDER — LETROZOLE 2.5 MG PO TABS: 2.5000 mg | ORAL_TABLET | Freq: Every morning | ORAL | Status: DC

## 2012-01-28 NOTE — Telephone Encounter (Signed)
Gave patient appointment for mammogram and bone density 02-2012  Gave patient appointemnt for lab and md 2014

## 2012-01-28 NOTE — Progress Notes (Signed)
Hematology and Oncology Follow Up Visit  [name] [id number] May 06, 1934 77 y.o. 01/28/2012 10:24 AM PCP  Principle Diagnosis: :  Locally advanced breast cancer, status post neoadjuvant chemotherapy, she has six cycles of FEC followed by Taxotere, status post mastectomy on 02/17/2007. PT2N2a ER/PR positive on Femara, status post radiation therapy completed in April 2009.   Interim History:  There have been no intercurrent illness, hospitalizations or medication changes. She has had a pacemaker placed in jan/13, she was bradycardic. Her LFT were extremely abnormal back in July and they have normalized. 2 history of middle cerebral artery stenosis and history of strokes. She had a stent placed as part of the study. Your TIA in March and had to get a interventional procedure to open up her stent. She has ongoing dizziness has resolved. She otherwise feels fairly well. She has type 2 diabetes which is under fairly good control as well. She continues on aspirin and Plavix. Medications: I have reviewed the patient's current medications.  Allergies:  Allergies  Allergen Reactions  . Oxycodone Hcl Er Nausea And Vomiting  . Crestor (Rosuvastatin Calcium) Other (See Comments)    dizziness  . Statins     Cramping in legs  . Zetia (Ezetimibe) Other (See Comments)    dizziness    Past Medical History, Surgical history, Social history, and Family History were reviewed and updated.  Review of Systems: Constitutional:  Negative for fever, chills, night sweats, anorexia, weight loss, pain. Cardiovascular: negative Respiratory: negative Neurological: negative Dermatological: negative ENT: negative Skin Gastrointestinal: negative Genito-Urinary: negative Hematological and Lymphatic: negative Breast: negative Musculoskeletal: negative Remaining ROS negative.  Physical Exam: Blood pressure 126/72, pulse 83, temperature 98 F (36.7 C), temperature source Oral, resp. rate 20, height 5\' 6"   (1.676 m), weight 215 lb 9.6 oz (97.796 kg). ECOG:  General appearance: alert, cooperative and appears stated age Head: Normocephalic, without obvious abnormality, atraumatic Neck: no adenopathy, no carotid bruit, no JVD, supple, symmetrical, trachea midline and thyroid not enlarged, symmetric, no tenderness/mass/nodules Lymph nodes: Cervical, supraclavicular, and axillary nodes normal. Cardiac : regular rate and rhythm, no murmurs or gallops Pulmonary:clear to auscultation bilaterally and normal percussion bilaterally Breasts: inspection negative, no nipple discharge or bleeding, no masses or nodularity palpable and s/p rt mrm, NED; lt breast and axila are normal. evidence of telangiectasia right chest Abdomen:soft, non-tender; bowel sounds normal; no masses,  no organomegaly Extremities negative Neuro: alert, oriented, normal speech, no focal findings or movement disorder noted  Lab Results: Lab Results  Component Value Date   WBC 7.3 11/28/2011   HGB 12.3 11/28/2011   HCT 35.0 11/28/2011   MCV 93.1 11/28/2011   PLT 226 11/28/2011     Chemistry      Component Value Date/Time   NA 135* 11/28/2011 1025   NA 127* 10/23/2011 0450   K 4.6 11/28/2011 1025   K 4.0 10/23/2011 0450   CL 97* 11/28/2011 1025   CL 94* 10/23/2011 0450   CO2 27 11/28/2011 1025   CO2 26 10/23/2011 0450   BUN 19.0 11/28/2011 1025   BUN 14 10/23/2011 0450   CREATININE 1.1 11/28/2011 1025   CREATININE 0.81 10/23/2011 0450      Component Value Date/Time   CALCIUM 10.0 11/28/2011 1025   CALCIUM 8.8 10/23/2011 0450   ALKPHOS 69 11/28/2011 1025   ALKPHOS 52 06/24/2011 1600   AST 14 11/28/2011 1025   AST 17 06/24/2011 1600   ALT 13 11/28/2011 1025   ALT 12 06/24/2011 1600  BILITOT 0.70 11/28/2011 1025   BILITOT 0.6 06/24/2011 1600      .pathology. Radiological Studies: chest X-ray n/a Mammogram Due 12/13 Bone density Due 12/13  Impression and Plan: Doing well, NED ; 5th yr of femara therapy. Will see in 12 months with  imaging studies. I will alternate visits with her surgeon.  More than 50% of the visit was spent in patient-related counselling   Pierce Crane, MD 11/18/201310:24 AM

## 2012-02-08 ENCOUNTER — Ambulatory Visit (HOSPITAL_COMMUNITY): Admission: RE | Admit: 2012-02-08 | Payer: Medicare Other | Source: Ambulatory Visit

## 2012-02-11 ENCOUNTER — Ambulatory Visit (HOSPITAL_COMMUNITY)
Admission: RE | Admit: 2012-02-11 | Discharge: 2012-02-11 | Disposition: A | Discharge status: Home / Self Care | Payer: Medicare Other | Source: Ambulatory Visit | Attending: Interventional Radiology | Admitting: Interventional Radiology

## 2012-02-11 ENCOUNTER — Encounter (HOSPITAL_COMMUNITY): Payer: Self-pay

## 2012-02-11 ENCOUNTER — Ambulatory Visit (HOSPITAL_COMMUNITY): Payer: Medicare Other

## 2012-02-11 DIAGNOSIS — I771 Stricture of artery: Secondary | ICD-10-CM

## 2012-02-11 LAB — BASIC METABOLIC PANEL
Calcium: 9.4 mg/dL (ref 8.4–10.5)
Creatinine, Ser: 0.97 mg/dL (ref 0.50–1.10)
GFR calc Af Amer: 65 mL/min — ABNORMAL LOW (ref >90)
GFR calc non Af Amer: 56 mL/min — ABNORMAL LOW (ref >90)

## 2012-02-11 MED ORDER — IOHEXOL 350 MG/ML SOLN
90.0000 mL | Freq: Once | INTRAVENOUS | Status: AC | PRN
  Administered 2012-02-11: 90 mL via INTRAVENOUS

## 2012-02-13 ENCOUNTER — Telehealth (HOSPITAL_COMMUNITY): Payer: Self-pay | Admitting: Interventional Radiology

## 2012-03-07 ENCOUNTER — Other Ambulatory Visit: Payer: Medicare Other

## 2012-03-07 ENCOUNTER — Ambulatory Visit: Payer: Medicare Other

## 2012-03-31 ENCOUNTER — Ambulatory Visit: Payer: Medicare Other

## 2012-03-31 ENCOUNTER — Other Ambulatory Visit: Payer: Medicare Other

## 2012-04-28 ENCOUNTER — Ambulatory Visit
Admission: RE | Admit: 2012-04-28 | Discharge: 2012-04-28 | Disposition: A | Discharge status: Home / Self Care | Payer: Medicare Other | Source: Ambulatory Visit | Attending: Oncology | Admitting: Oncology

## 2012-04-28 DIAGNOSIS — C50919 Malignant neoplasm of unspecified site of unspecified female breast: Secondary | ICD-10-CM

## 2012-04-28 DIAGNOSIS — M8580 Other specified disorders of bone density and structure, unspecified site: Secondary | ICD-10-CM

## 2012-05-06 ENCOUNTER — Telehealth: Payer: Self-pay | Admitting: *Deleted

## 2012-05-06 NOTE — Telephone Encounter (Signed)
Called and spoke with patient to reschedule her appt. Confirmed appt. For 07/02/12 at 0900 with Bernell List.  Then will become Dr.Khan.

## 2012-05-19 ENCOUNTER — Encounter: Payer: Self-pay | Admitting: *Deleted

## 2012-05-19 ENCOUNTER — Encounter: Payer: Self-pay | Admitting: Oncology

## 2012-05-19 NOTE — Progress Notes (Signed)
Mailed letter & calendar to pt. 

## 2012-06-04 ENCOUNTER — Other Ambulatory Visit: Payer: Self-pay | Admitting: Neurology

## 2012-06-05 ENCOUNTER — Emergency Department (HOSPITAL_COMMUNITY)
Admission: EM | Admit: 2012-06-05 | Discharge: 2012-06-05 | Disposition: A | Discharge status: Home / Self Care | Payer: Medicare Other | Attending: Emergency Medicine | Admitting: Emergency Medicine

## 2012-06-05 DIAGNOSIS — Z950 Presence of cardiac pacemaker: Secondary | ICD-10-CM | POA: Insufficient documentation

## 2012-06-05 DIAGNOSIS — Z8679 Personal history of other diseases of the circulatory system: Secondary | ICD-10-CM | POA: Insufficient documentation

## 2012-06-05 DIAGNOSIS — Z9889 Other specified postprocedural states: Secondary | ICD-10-CM | POA: Insufficient documentation

## 2012-06-05 DIAGNOSIS — I10 Essential (primary) hypertension: Secondary | ICD-10-CM | POA: Insufficient documentation

## 2012-06-05 DIAGNOSIS — Z862 Personal history of diseases of the blood and blood-forming organs and certain disorders involving the immune mechanism: Secondary | ICD-10-CM | POA: Insufficient documentation

## 2012-06-05 DIAGNOSIS — F329 Major depressive disorder, single episode, unspecified: Secondary | ICD-10-CM | POA: Insufficient documentation

## 2012-06-05 DIAGNOSIS — Z8639 Personal history of other endocrine, nutritional and metabolic disease: Secondary | ICD-10-CM | POA: Insufficient documentation

## 2012-06-05 DIAGNOSIS — Z8673 Personal history of transient ischemic attack (TIA), and cerebral infarction without residual deficits: Secondary | ICD-10-CM | POA: Insufficient documentation

## 2012-06-05 DIAGNOSIS — Z8669 Personal history of other diseases of the nervous system and sense organs: Secondary | ICD-10-CM | POA: Insufficient documentation

## 2012-06-05 DIAGNOSIS — R1084 Generalized abdominal pain: Secondary | ICD-10-CM | POA: Insufficient documentation

## 2012-06-05 DIAGNOSIS — F3289 Other specified depressive episodes: Secondary | ICD-10-CM | POA: Insufficient documentation

## 2012-06-05 DIAGNOSIS — E785 Hyperlipidemia, unspecified: Secondary | ICD-10-CM | POA: Insufficient documentation

## 2012-06-05 DIAGNOSIS — R197 Diarrhea, unspecified: Secondary | ICD-10-CM | POA: Insufficient documentation

## 2012-06-05 DIAGNOSIS — E119 Type 2 diabetes mellitus without complications: Secondary | ICD-10-CM | POA: Insufficient documentation

## 2012-06-05 DIAGNOSIS — Z79899 Other long term (current) drug therapy: Secondary | ICD-10-CM | POA: Insufficient documentation

## 2012-06-05 DIAGNOSIS — R112 Nausea with vomiting, unspecified: Secondary | ICD-10-CM | POA: Insufficient documentation

## 2012-06-05 DIAGNOSIS — Z7902 Long term (current) use of antithrombotics/antiplatelets: Secondary | ICD-10-CM | POA: Insufficient documentation

## 2012-06-05 DIAGNOSIS — Z780 Asymptomatic menopausal state: Secondary | ICD-10-CM | POA: Insufficient documentation

## 2012-06-05 DIAGNOSIS — Z9089 Acquired absence of other organs: Secondary | ICD-10-CM | POA: Insufficient documentation

## 2012-06-05 DIAGNOSIS — M129 Arthropathy, unspecified: Secondary | ICD-10-CM | POA: Insufficient documentation

## 2012-06-05 DIAGNOSIS — Z7982 Long term (current) use of aspirin: Secondary | ICD-10-CM | POA: Insufficient documentation

## 2012-06-05 DIAGNOSIS — Z853 Personal history of malignant neoplasm of breast: Secondary | ICD-10-CM | POA: Insufficient documentation

## 2012-06-05 LAB — CBC
HCT: 34.6 % — ABNORMAL LOW (ref 36.0–46.0)
Hemoglobin: 12.4 g/dL (ref 12.0–15.0)
MCH: 32 pg (ref 26.0–34.0)
MCHC: 35.8 g/dL (ref 30.0–36.0)
MCV: 89.2 fL (ref 78.0–100.0)

## 2012-06-05 LAB — URINE MICROSCOPIC-ADD ON

## 2012-06-05 LAB — URINALYSIS, ROUTINE W REFLEX MICROSCOPIC
Ketones, ur: 40 mg/dL — AB
Nitrite: NEGATIVE
Specific Gravity, Urine: 1.019 (ref 1.005–1.030)
Urobilinogen, UA: 1 mg/dL (ref 0.0–1.0)
pH: 6.5 (ref 5.0–8.0)

## 2012-06-05 LAB — COMPREHENSIVE METABOLIC PANEL
BUN: 25 mg/dL — ABNORMAL HIGH (ref 6–23)
CO2: 26 mEq/L (ref 19–32)
Calcium: 9.4 mg/dL (ref 8.4–10.5)
GFR calc Af Amer: 77 mL/min — ABNORMAL LOW (ref >90)
GFR calc non Af Amer: 66 mL/min — ABNORMAL LOW (ref >90)
Glucose, Bld: 148 mg/dL — ABNORMAL HIGH (ref 70–99)
Total Protein: 6.6 g/dL (ref 6.0–8.3)

## 2012-06-05 MED ORDER — ONDANSETRON HCL 4 MG PO TABS: 4.0000 mg | ORAL_TABLET | Freq: Four times a day (QID) | ORAL | Status: DC

## 2012-06-05 MED ORDER — ONDANSETRON HCL 4 MG/2ML IJ SOLN
4.0000 mg | Freq: Once | INTRAMUSCULAR | Status: AC
  Administered 2012-06-05: 4 mg via INTRAVENOUS
  Filled 2012-06-05: qty 2

## 2012-06-05 MED ORDER — SODIUM CHLORIDE 0.9 % IV BOLUS (SEPSIS)
500.0000 mL | Freq: Once | INTRAVENOUS | Status: AC
  Administered 2012-06-05: 500 mL via INTRAVENOUS

## 2012-06-05 NOTE — ED Notes (Signed)
Per EMS: Pt from home with c/o N/V since 1600 today. Reports 2 episodes of vomiting. Pt AO x 4. VSS. Denies any pain. BG  143

## 2012-06-05 NOTE — ED Notes (Signed)
Pt sleeping iv started med given for nausea.  sats low ?? Due to sleeping

## 2012-06-05 NOTE — ED Provider Notes (Signed)
History     CSN: [id number]  Arrival date & time 06/05/12  1908   First MD Initiated Contact with Patient 06/05/12 1914      Chief Complaint  Patient presents with  . Nausea  . Emesis    (Consider location/radiation/quality/duration/timing/severity/associated sxs/prior treatment) HPI Pt presents with c/o nausea, vomiting and diarrhea.  Pt states symptoms began approx 3-4pm today.  Symptoms began acutely.  Cramping diffuse abdominal pain.  No fever/chills.  No urinary symptoms.  Pt has also had watery diarrhea.  Emesis nonbloody and nonbilious.  No fever/chills.  Pt has not ahd any treatment prior to arrival.  There are no other associated systemic symptoms, there are no other alleviating or modifying factors.   Past Medical History  Diagnosis Date  . Goiter   . Arthritis   . Dizziness - light-headed   . Family history of breast cancer     Did not specify what member of family per history form dated 02/15/10.  . Menopause   . Slow heart rate   . Hyperlipidemia   . PONV (postoperative nausea and vomiting)   . Hypertension     dr croitoru   SE  . Pacemaker     st jude  . Stroke     TIA per patient history form dated 02/15/10.  . Diabetes mellitus     Type 2  . Cancer     rt. breast ca  . Family history of anesthesia complication     Daughter Nausea/Vomitting  . Depression   . Familial tremor     takes Inderal  (tremor of head)    Past Surgical History  Procedure Laterality Date  . Joint replacement      Knee - ask patient to clarify which knee or both.  . Brain surgery      Stent placement  . Gallbladder surgery    . Mastectomy    . Thyroid surgery      due to Goiter  . Cholecystectomy    . Total knee arthroplasty  2008    left  . Breast surgery  2009    lumpectomy  . Cardiac catheterization    . Colonscopy      Family History  Problem Relation Age of Onset  . COPD Mother   . Cancer Father     lung, colon  . Cancer Maternal Aunt     breast  . Cancer  Maternal Grandmother     breast    History  Substance Use Topics  . Smoking status: Never Smoker   . Smokeless tobacco: Not on file  . Alcohol Use: No    OB History   Grav Para Term Preterm Abortions TAB SAB Ect Mult Living                  Review of Systems ROS reviewed and all otherwise negative except for mentioned in HPI  Allergies  Oxycodone hcl er; Crestor; Statins; and Zetia  Home Medications   Current Outpatient Rx  Name  Route  Sig  Dispense  Refill  . aspirin 81 MG EC tablet   Oral   Take 81 mg by mouth every morning.         Marland Kitchen CALCIUM PO   Oral   Take 630 mg by mouth daily.         . Cholecalciferol (VITAMIN D) 2000 UNITS tablet   Oral   Take 2,000 Units by mouth every morning.          Marland Kitchen  citalopram (CELEXA) 20 MG tablet   Oral   Take 10 mg by mouth 2 (two) times daily.          . clopidogrel (PLAVIX) 75 MG tablet   Oral   Take 75 mg by mouth daily.         . Cyanocobalamin (VITAMIN B-12) 5000 MCG SUBL   Sublingual   Place 5,000 mcg under the tongue daily.         . fish oil-omega-3 fatty acids 1000 MG capsule   Oral   Take 1 g by mouth every morning.         Marland Kitchen glipiZIDE (GLUCOTROL XL) 10 MG 24 hr tablet   Oral   Take 10 mg by mouth daily with breakfast.          . letrozole (FEMARA) 2.5 MG tablet   Oral   Take 1 tablet (2.5 mg total) by mouth every morning.   90 tablet   4   . lisinopril (PRINIVIL,ZESTRIL) 10 MG tablet   Oral   Take 10 mg by mouth every evening.          Marland Kitchen MAGNESIUM PO   Oral   Take 500 mg by mouth daily.         . metFORMIN (GLUCOPHAGE) 500 MG tablet   Oral   Take 500 mg by mouth 2 (two) times daily.          . Multiple Vitamin (MULTIVITAMIN WITH MINERALS) TABS   Oral   Take 1 tablet by mouth every morning.         . Promethazine HCl (PHENERGAN PO)   Oral   Take 1 tablet by mouth once. Unsure dose         . propranolol (INDERAL) 40 MG tablet   Oral   Take 20-40 mg by mouth 2  (two) times daily. 1 tablet (40 mg) every morning and 1/2 tablet (20 mg) every evening         . ondansetron (ZOFRAN) 4 MG tablet   Oral   Take 1 tablet (4 mg total) by mouth every 6 (six) hours.   12 tablet   0     BP 122/59  Pulse 89  Temp(Src) 98.9 F (37.2 C) (Oral)  Resp 16  SpO2 99% Vitals reviewed Physical Exam Physical Examination: General appearance - alert, well appearing, and in no distress Mental status - alert, oriented to person, place, and time Eyes - no conjunctival injection, no scleral icterus Mouth - mucous membranes moist, pharynx normal without lesions Chest - clear to auscultation, no wheezes, rales or rhonchi, symmetric air entry Heart - normal rate, regular rhythm, normal S1, S2, no murmurs, rubs, clicks or gallops Abdomen - soft, nontender, nondistended, no masses or organomegaly, nabs Extremities - peripheral pulses normal, no pedal edema, no clubbing or cyanosis Skin - normal coloration and turgor, no rashes  ED Course  Procedures (including critical care time)   Date: 06/05/2012  Rate: 82  Rhythm: paced rhythm  QRS Axis: left  Intervals: paced rhtyhm  ST/T Wave abnormalities: nonspecific ST/T changes  Conduction Disutrbances:paced rhythm  Narrative Interpretation:   Old EKG Reviewed: unchanged compared to prior ekg of 06/24/11  11:16 PM pt was resting comfortably, has tolerated po trial without difficulty.     Labs Reviewed  CBC - Abnormal; Notable for the following:    HCT 34.6 (*)    All other components within normal limits  COMPREHENSIVE METABOLIC PANEL - Abnormal; Notable for the  following:    Glucose, Bld 148 (*)    BUN 25 (*)    GFR calc non Af Amer 66 (*)    GFR calc Af Amer 77 (*)    All other components within normal limits  URINALYSIS, ROUTINE W REFLEX MICROSCOPIC - Abnormal; Notable for the following:    APPearance CLOUDY (*)    Hgb urine dipstick TRACE (*)    Ketones, ur 40 (*)    Leukocytes, UA MODERATE (*)    All  other components within normal limits  URINE MICROSCOPIC-ADD ON - Abnormal; Notable for the following:    Bacteria, UA FEW (*)    All other components within normal limits  URINE CULTURE   No results found.   1. Nausea vomiting and diarrhea       MDM  Pt with multiple medical problems presenting with nausea/vomiting and diarrhea.  Labs are reassuring as are vital signs and exam.  Pt feels much improved after nausea medicaiton and fluids.  She is resting comfortably on recheck and has tolerated po trial without difficulty.  Low suspicion for acute emergent process at this time.  Feel pt is stable for discharge.  Discharged with strict return precautions.  Pt agreeable with plan.        Ethelda Chick, MD 06/05/12 [phone]

## 2012-06-05 NOTE — ED Notes (Signed)
Pt sleeping soundly. O2 decreased to 83%. Bed raised, and pt stayed at 89%. 2L placed on pt and O2 increased to 97%.

## 2012-06-07 LAB — URINE CULTURE: Colony Count: 80000

## 2012-07-02 ENCOUNTER — Ambulatory Visit (HOSPITAL_BASED_OUTPATIENT_CLINIC_OR_DEPARTMENT_OTHER): Payer: Medicare Other | Admitting: Family

## 2012-07-02 ENCOUNTER — Ambulatory Visit: Payer: Medicare Other | Admitting: Oncology

## 2012-07-02 ENCOUNTER — Encounter: Payer: Self-pay | Admitting: Family

## 2012-07-02 ENCOUNTER — Telehealth: Payer: Self-pay | Admitting: Oncology

## 2012-07-02 ENCOUNTER — Other Ambulatory Visit: Payer: Medicare Other | Admitting: Lab

## 2012-07-02 DIAGNOSIS — Z853 Personal history of malignant neoplasm of breast: Secondary | ICD-10-CM

## 2012-07-02 NOTE — Patient Instructions (Addendum)
Please contact us at [phone] if you have any questions or concerns.  You may stop taking Femara today.  Continue to take Vitamin D and Calcium daily.  Results for orders placed during the hospital encounter of 06/05/12 (from the past 840 hour(s))  CBC   Collection Time    06/05/12  7:31 PM      Result Value Range   WBC 7.1  4.0 - 10.5 K/uL   RBC 3.88  3.87 - 5.11 MIL/uL   Hemoglobin 12.4  12.0 - 15.0 g/dL   HCT 28.4 (*) 13.2 - 44.0 %   MCV 89.2  78.0 - 100.0 fL   MCH 32.0  26.0 - 34.0 pg   MCHC 35.8  30.0 - 36.0 g/dL   RDW 10.2  72.5 - 36.6 %   Platelets 159  150 - 400 K/uL  COMPREHENSIVE METABOLIC PANEL   Collection Time    06/05/12  7:31 PM      Result Value Range   Sodium 136  135 - 145 mEq/L   Potassium 4.2  3.5 - 5.1 mEq/L   Chloride 99  96 - 112 mEq/L   CO2 26  19 - 32 mEq/L   Glucose, Bld 148 (*) 70 - 99 mg/dL   BUN 25 (*) 6 - 23 mg/dL   Creatinine, Ser 4.40  0.50 - 1.10 mg/dL   Calcium 9.4  8.4 - 34.7 mg/dL   Total Protein 6.6  6.0 - 8.3 g/dL   Albumin 3.6  3.5 - 5.2 g/dL   AST 19  0 - 37 U/L   ALT 12  0 - 35 U/L   Alkaline Phosphatase 52  39 - 117 U/L   Total Bilirubin 0.6  0.3 - 1.2 mg/dL   GFR calc non Af Amer 66 (*) >90 mL/min   GFR calc Af Amer 77 (*) >90 mL/min  URINE CULTURE   Collection Time    06/05/12  9:03 PM      Result Value Range   Specimen Description URINE, CLEAN CATCH     Special Requests NONE     Culture  Setup Time 06/06/2012 05:55     Colony Count 80,000 COLONIES/ML     Culture       Value: Multiple bacterial morphotypes present, none predominant. Suggest appropriate recollection if clinically indicated.   Report Status 06/07/2012 FINAL    URINALYSIS, ROUTINE W REFLEX MICROSCOPIC   Collection Time    06/05/12  9:03 PM      Result Value Range   Color, Urine YELLOW  YELLOW   APPearance CLOUDY (*) CLEAR   Specific Gravity, Urine 1.019  1.005 - 1.030   pH 6.5  5.0 - 8.0   Glucose, UA NEGATIVE  NEGATIVE mg/dL   Hgb urine  dipstick TRACE (*) NEGATIVE   Bilirubin Urine NEGATIVE  NEGATIVE   Ketones, ur 40 (*) NEGATIVE mg/dL   Protein, ur NEGATIVE  NEGATIVE mg/dL   Urobilinogen, UA 1.0  0.0 - 1.0 mg/dL   Nitrite NEGATIVE  NEGATIVE   Leukocytes, UA MODERATE (*) NEGATIVE  URINE MICROSCOPIC-ADD ON   Collection Time    06/05/12  9:03 PM      Result Value Range   Squamous Epithelial / LPF RARE  RARE   WBC, UA 3-6  <3 WBC/hpf   RBC / HPF 0-2  <3 RBC/hpf   Bacteria, UA FEW (*) RARE   Health Maintenance, Females  A healthy lifestyle and preventative care can promote health and wellness.  Maintain regular health, dental, and eye exams.  Eat a healthy diet. Foods like vegetables, fruits, whole grains, low-fat dairy products, and lean protein foods contain the nutrients you need without too many calories. Decrease your intake of foods high in solid fats, added sugars, and salt. Get information about a proper diet from your caregiver, if necessary.  Regular physical exercise is one of the most important things you can do for your health. Most adults should get at least 150 minutes of moderate-intensity exercise (any activity that increases your heart rate and causes you to sweat) each week. In addition, most adults need muscle-strengthening exercises on 2 or more days a week.  Maintain a healthy weight. The body mass index (BMI) is a screening tool to identify possible weight problems. It provides an estimate of body fat based on height and weight. Your caregiver can help determine your BMI, and can help you achieve or maintain a healthy weight. For adults 20 years and older:  A BMI below 18.5 is considered underweight.  A BMI of 18.5 to 24.9 is normal.  A BMI of 25 to 29.9 is considered overweight.  A BMI of 30 and above is considered obese.  Maintain normal blood lipids and cholesterol by exercising and minimizing your intake of saturated fat. Eat a balanced diet with plenty of fruits and vegetables. Blood tests for  lipids and cholesterol should begin at age 34 and be repeated every 5 years. If your lipid or cholesterol levels are high, you are over 50, or you are a high risk for heart disease, you may need your cholesterol levels checked more frequently. Ongoing high lipid and cholesterol levels should be treated with medicines if diet and exercise are not effective.  If you smoke, find out from your caregiver how to quit. If you do not use tobacco, do not start.  If you are pregnant, do not drink alcohol. If you are breastfeeding, be very cautious about drinking alcohol. If you are not pregnant and choose to drink alcohol, do not exceed 1 drink per day. One drink is considered to be 12 ounces (355 mL) of beer, 5 ounces (148 mL) of wine, or 1.5 ounces (44 mL) of liquor.  Avoid use of street drugs. Do not share needles with anyone. Ask for help if you need support or instructions about stopping the use of drugs.  High blood pressure causes heart disease and increases the risk of stroke. Blood pressure should be checked at least every 1 to 2 years. Ongoing high blood pressure should be treated with medicines, if weight loss and exercise are not effective.  If you are 40 to 76 years old, ask your caregiver if you should take aspirin to prevent strokes.  Diabetes screening involves taking a blood sample to check your fasting blood sugar level. This should be done once every 3 years, after age 40, if you are within normal weight and without risk factors for diabetes. Testing should be considered at a younger age or be carried out more frequently if you are overweight and have at least 1 risk factor for diabetes.  Breast cancer screening is essential preventative care for women. You should practice "breast self-awareness." This means understanding the normal appearance and feel of your breasts and may include breast self-examination. Any changes detected, no matter how small, should be reported to a caregiver. Women in their  54s and 30s should have a clinical breast exam (CBE) by a caregiver as part of a regular health  exam every 1 to 3 years. After age 36, women should have a CBE every year. Starting at age 21, women should consider having a mammogram (breast X-ray) every year. Women who have a family history of breast cancer should talk to their caregiver about genetic screening. Women at a high risk of breast cancer should talk to their caregiver about having an MRI and a mammogram every year.  The Pap test is a screening test for cervical cancer. Women should have a Pap test starting at age 80. Between ages 53 and 40, Pap tests should be repeated every 2 years. Beginning at age 39, you should have a Pap test every 3 years as long as the past 3 Pap tests have been normal. If you had a hysterectomy for a problem that was not cancer or a condition that could lead to cancer, then you no longer need Pap tests. If you are between ages 73 and 18, and you have had normal Pap tests going back 10 years, you no longer need Pap tests. If you have had past treatment for cervical cancer or a condition that could lead to cancer, you need Pap tests and screening for cancer for at least 20 years after your treatment. If Pap tests have been discontinued, risk factors (such as a new sexual partner) need to be reassessed to determine if screening should be resumed. Some women have medical problems that increase the chance of getting cervical cancer. In these cases, your caregiver may recommend more frequent screening and Pap tests.  The human papillomavirus (HPV) test is an additional test that may be used for cervical cancer screening. The HPV test looks for the virus that can cause the cell changes on the cervix. The cells collected during the Pap test can be tested for HPV. The HPV test could be used to screen women aged 98 years and older, and should be used in women of any age who have unclear Pap test results. After the age of 42, women should  have HPV testing at the same frequency as a Pap test.  Colorectal cancer can be detected and often prevented. Most routine colorectal cancer screening begins at the age of 50 and continues through age 48. However, your caregiver may recommend screening at an earlier age if you have risk factors for colon cancer. On a yearly basis, your caregiver may provide home test kits to check for hidden blood in the stool. Use of a small camera at the end of a tube, to directly examine the colon (sigmoidoscopy or colonoscopy), can detect the earliest forms of colorectal cancer. Talk to your caregiver about this at age 21, when routine screening begins. Direct examination of the colon should be repeated every 5 to 10 years through age 70, unless early forms of pre-cancerous polyps or small growths are found.  Hepatitis C blood testing is recommended for all people born from [birth year] through 1963 and any individual with known risks for hepatitis C.  Practice safe sex. Use condoms and avoid high-risk sexual practices to reduce the spread of sexually transmitted infections (STIs). Sexually active women aged 36 and younger should be checked for Chlamydia, which is a common sexually transmitted infection. Older women with new or multiple partners should also be tested for Chlamydia. Testing for other STIs is recommended if you are sexually active and at increased risk.  Osteoporosis is a disease in which the bones lose minerals and strength with aging. This can result in serious bone fractures. The  risk of osteoporosis can be identified using a bone density scan. Women ages 20 and over and women at risk for fractures or osteoporosis should discuss screening with their caregivers. Ask your caregiver whether you should be taking a calcium supplement or vitamin D to reduce the rate of osteoporosis.  Menopause can be associated with physical symptoms and risks. Hormone replacement therapy is available to decrease symptoms and risks. You  should talk to your caregiver about whether hormone replacement therapy is right for you.  Use sunscreen with a sun protection factor (SPF) of 30 or greater. Apply sunscreen liberally and repeatedly throughout the day. You should seek shade when your shadow is shorter than you. Protect yourself by wearing long sleeves, pants, a wide-brimmed hat, and sunglasses year round, whenever you are outdoors.  Notify your caregiver of new moles or changes in moles, especially if there is a change in shape or color. Also notify your caregiver if a mole is larger than the size of a pencil eraser.  Stay current with your immunizations.  Document Released: 09/11/2010 Document Revised: 05/21/2011 Document Reviewed: 09/11/2010 [facility] Patient Information 2013 [place], [place].

## 2012-07-02 NOTE — Progress Notes (Signed)
[facility]  Telephone:(336) [phone] Fax:([phone]) [phone]  OFFICE PROGRESS NOTE  PATIENT: [name]   DOB: 11-Dec-1934  MR#: [id number]  JYN#:[id number]   QM:VHQIONG,EXBMW A, MD Billie Lade, MD Sandria Bales.  Ezzard Standing, MD Barnabas Lister, MD Jordan Hawks. Elnoria Howard, MD    DIAGNOSIS: A 78 year old [place], [place] woman with a history of right breast invasive ductal carcinoma with associated DCIS diagnosed in 09/2006.   PRIOR THERAPY: 1.  On 10/07/2006 the patient underwent a needle core biopsy of the right breast at the 2:00 position which showed invasive mammary carcinoma.  2.  On 10/16/2006 the patient underwent a bilateral breast MRI which showed within the upper outer quadrant of the right breast, there was a mass of confluent nodules which demonstrated persistent enhancement type kinetics. Measured together, this conglomerate mass was 3.4 x 4.6 x 3.5 cm. Discrete from this mass, there was a small nodule inferior and lateral to the larger mass which measured 8 x 8 x 6 mm.  No enlarged internal mammary or axillary lymph nodes were identified. Images of the left breast were unremarkable.   3.  The patient underwent neoadjuvant chemotherapy with FEC x 3 cycles from 11/06/2006 through 12/18/2006 and neoadjuvant Taxotere x1 cycle on 01/09/2007.  4. On 02/17/2007 the patient had a right breast modified radical mastectomy with axillary lymph node biopsy and inferior skin flap excision for a stage IIIA, pT2 pN2a, 5.0 cm invasive ductal carcinoma high grade with high grade DCIS and associated calcifications/necrosis/lymphovascular invasion identified, ER 38%, PR 10%, Ki-67 41%, HER-2/neu negative by FISH with a ratio 1.5, 7/20 positive lymph nodes with 1 isolated tumor cell identified.  5.  The patient underwent radiation therapy from 05/05/2007 through 06/18/2007.  6.  The patient has been on antiestrogen therapy with Femara since 06/2007.   CURRENT THERAPY: Femara 2.5 mg by  mouth daily.   INTERVAL HISTORY: Dr. Welton Flakes and I saw Ms. Rufina Falco today for follow up of right breast invasive ductal carcinoma.  Ms. Peyser was last seen by Dr. Donnie Coffin on 01/28/2012.  Since her last office visit, she states that she has been on physician imposed low-sodium diet by Dr.. Barnabas Lister, [facility].  The patient also states that she had the "stomach flu" which primarily consisted of diarrhea in 05/2012.  The patient also had rectal bleeding for 3 months and is under the care of Dr. Jeani Hawking, Gastroenterologist.  The patient denies any other symptomatology.   PAST MEDICAL HISTORY: Past Medical History  Diagnosis Date  . Goiter   . Arthritis   . Dizziness - light-headed   . Family history of breast cancer     Did not specify what member of family per history form dated 02/15/10.  . Menopause   . Slow heart rate   . Hyperlipidemia   . PONV (postoperative nausea and vomiting)   . Hypertension     dr croitoru   SE  . Pacemaker     st jude  . Stroke     TIA per patient history form dated 02/15/10.  . Diabetes mellitus     Type 2  . Cancer     rt. breast ca  . Family history of anesthesia complication     Daughter Nausea/Vomitting  . Depression   . Familial tremor     takes Inderal  (tremor of head)  . Hyponatremia   . Rectal bleeding     PAST SURGICAL HISTORY: Past Surgical History  Procedure Laterality  Date  . Joint replacement      Knee - ask patient to clarify which knee or both.  . Brain surgery      Stent placement  . Gallbladder surgery    . Mastectomy    . Thyroid surgery      due to Goiter  . Cholecystectomy    . Total knee arthroplasty  2008    left  . Breast surgery  2009    lumpectomy  . Cardiac catheterization    . Colonscopy      FAMILY HISTORY: Family History  Problem Relation Age of Onset  . COPD Mother   . Cancer Father     lung, colon  . Cancer Maternal Aunt     breast  . Cancer Maternal Grandmother     breast     SOCIAL HISTORY: History  Substance Use Topics  . Smoking status: Never Smoker   . Smokeless tobacco: Never Used  . Alcohol Use: No    ALLERGIES: Allergies  Allergen Reactions  . Oxycodone Hcl Er Nausea And Vomiting  . Zofran (Ondansetron Hcl) Swelling  . Crestor (Rosuvastatin Calcium) Other (See Comments)    dizziness  . Statins Other (See Comments)    Cramping in legs  . Zetia (Ezetimibe) Other (See Comments)    dizziness    MEDICATIONS:  Current Outpatient Prescriptions  Medication Sig Dispense Refill  . aspirin 81 MG EC tablet Take 81 mg by mouth every morning.      Marland Kitchen CALCIUM PO Take 630 mg by mouth daily.      . Cholecalciferol (VITAMIN D) 2000 UNITS tablet Take 2,000 Units by mouth every morning.       . clopidogrel (PLAVIX) 75 MG tablet Take 75 mg by mouth daily.      . Cyanocobalamin (VITAMIN B-12) 5000 MCG SUBL Place 5,000 mcg under the tongue daily.      Marland Kitchen escitalopram (LEXAPRO) 10 MG tablet Take 10 mg by mouth daily.       . fish oil-omega-3 fatty acids 1000 MG capsule Take 1 g by mouth every morning.      Marland Kitchen glipiZIDE (GLUCOTROL XL) 10 MG 24 hr tablet Take 10 mg by mouth daily with breakfast.       . HYDROcodone-acetaminophen (NORCO) 10-325 MG per tablet Take 1 tablet by mouth every 6 (six) hours as needed for pain.       Marland Kitchen letrozole (FEMARA) 2.5 MG tablet Take 1 tablet (2.5 mg total) by mouth every morning.  90 tablet  4  . lisinopril (PRINIVIL,ZESTRIL) 10 MG tablet Take 10 mg by mouth every evening.       Marland Kitchen MAGNESIUM PO Take 500 mg by mouth daily.      . metFORMIN (GLUCOPHAGE) 500 MG tablet Take 500 mg by mouth 2 (two) times daily.       . Multiple Vitamin (MULTIVITAMIN WITH MINERALS) TABS Take 1 tablet by mouth every morning.      . Promethazine HCl (PHENERGAN PO) Take 25 mg by mouth every 4 (four) hours as needed. Unsure dose      . propranolol (INDERAL) 40 MG tablet Take 20-40 mg by mouth 2 (two) times daily. 1 tablet (40 mg) every morning and 1/2 tablet  (20 mg) every evening      . [DISCONTINUED] insulin glargine (LANTUS SOLOSTAR) 100 UNIT/ML injection Inject 5 Units into the skin daily as needed. For blood sugar over 120       No current facility-administered medications for  this visit.      REVIEW OF SYSTEMS: A 10 point review of systems was completed and is negative except as noted above.    PHYSICAL EXAMINATION: There were no vitals taken for this visit.   General appearance: Alert, cooperative, well nourished, no apparent distress Head: Normocephalic, without obvious abnormality, atraumatic Eyes: Arcus senilis, PERRLA, EOMI Nose: Nares, septum and mucosa are normal, no drainage or sinus tenderness Neck: No adenopathy, supple, symmetrical, trachea midline, thyroid not enlarged, no tenderness, large well-healed scar in thyroid area Resp: Clear to auscultation bilaterally Cardio: Regular rate and rhythm, S1, S2 normal, no murmur, click, rub or gallop Breasts: Right breast has been surgically removed, right chest area has radiation changes and well-healed surgical scars, no lymphadenopathy, left breast has a contracted nipple, no axilla fullness GI: Soft, distended, non-tender, hypoactive bowel sounds, excessive habitus Extremities: Extremities normal, atraumatic, no cyanosis or edema Lymph nodes: Cervical, supraclavicular, and axillary nodes normal Neurologic: Grossly normal, bilateral coarse upper extremity tremors, mild speech impairment   ECOG FS:  Grade 1 - Symptomatic but completely ambulatory  LAB RESULTS: Lab Results  Component Value Date   WBC 7.1 06/05/2012   NEUTROABS 5.8 11/28/2011   HGB 12.4 06/05/2012   HCT 34.6* 06/05/2012   MCV 89.2 06/05/2012   PLT 159 06/05/2012      Chemistry      Component Value Date/Time   NA 136 06/05/2012 1931   NA 135* 11/28/2011 1025   K 4.2 06/05/2012 1931   K 4.6 11/28/2011 1025   CL 99 06/05/2012 1931   CL 97* 11/28/2011 1025   CO2 26 06/05/2012 1931   CO2 27 11/28/2011 1025   BUN  25* 06/05/2012 1931   BUN 19.0 11/28/2011 1025   CREATININE 0.84 06/05/2012 1931   CREATININE 1.1 11/28/2011 1025      Component Value Date/Time   CALCIUM 9.4 06/05/2012 1931   CALCIUM 10.0 11/28/2011 1025   ALKPHOS 52 06/05/2012 1931   ALKPHOS 69 11/28/2011 1025   AST 19 06/05/2012 1931   AST 14 11/28/2011 1025   ALT 12 06/05/2012 1931   ALT 13 11/28/2011 1025   BILITOT 0.6 06/05/2012 1931   BILITOT 0.70 11/28/2011 1025       Lab Results  Component Value Date   LABCA2 16 05/11/2011    No components found with this basename: [id number]     RADIOGRAPHIC STUDIES: No results found.  ASSESSMENT: 78 y.o. [place], [place] woman:  1. On 10/07/2006 the patient underwent a needle core biopsy of the right breast at the 2:00 position which showed invasive mammary carcinoma. On 10/16/2006 the patient underwent a bilateral breast MRI which showed within the upper outer quadrant of the right breast, there was a mass of confluent nodules that measured 3.4 x 4.6 x 3.5 cm. No enlarged internal mammary or axillary lymph nodes were identified. Images of the left breast were unremarkable.  The patient underwent neoadjuvant chemotherapy with FEC x 3 cycles from 11/06/2006 through 12/18/2006 and neoadjuvant Taxotere x1 cycle on 01/09/2007. On 02/17/2007 the patient had a right breast modified radical mastectomy with axillary lymph node biopsy and inferior skin flap excision for a stage IIIA, pT2 pN2a, 5.0 cm invasive ductal carcinoma high grade with high grade DCIS and associated calcifications/necrosis/lymphovascular invasion identified, ER 38%, PR 10%, Ki-67 41%, HER-2/neu negative by FISH with a ratio 1.5, 7/20 positive lymph nodes with 1 isolated tumor cell identified.   The patient underwent radiation therapy from 05/05/2007 through 06/18/2007.  The patient has been on antiestrogen therapy with Femara since 06/2007.  2.  The patient had a digital left unilateral screening mammogram on 04/28/2012 which  showed no mammographic evidence of malignancy.  3.  The patient had a bone density scan on 04/28/2012 which showed a T score of -2.3 (osteopenia).  PLAN: 1.  The patient has completed 5 years of antiestrogen therapy with Femara and was asked to stop taking Femara today.  2.  The patient will be due for her annual digital left unilateral screening mammogram in 04/2013 and we will schedule this for her.  3.  The patient was asked to continue taking vitamin D 2000 IUs by mouth daily and calcium 600 mg by mouth daily with regards to her osteopenia.  Her next bone density scan will be due in 04/2014.  4.  We plan to see the patient again in one year (06/2013) at which time we will check a CBC, CMP, LDH, and vitamin D level.  Dr. Welton Flakes like the patient to be followed annually for at least the next 5 years.  All questions were answered.  The patient was encouraged to contact us in the interim with any problems, questions or concerns.    Larina Bras, NP-C 07/05/2012, 3:35 PM

## 2012-07-29 ENCOUNTER — Emergency Department (HOSPITAL_COMMUNITY)
Admission: EM | Admit: 2012-07-29 | Discharge: 2012-07-29 | Disposition: A | Discharge status: Home / Self Care | Payer: Medicare Other | Attending: Emergency Medicine | Admitting: Emergency Medicine

## 2012-07-29 ENCOUNTER — Encounter (HOSPITAL_COMMUNITY): Payer: Self-pay | Admitting: Neurology

## 2012-07-29 DIAGNOSIS — R112 Nausea with vomiting, unspecified: Secondary | ICD-10-CM | POA: Insufficient documentation

## 2012-07-29 DIAGNOSIS — R109 Unspecified abdominal pain: Secondary | ICD-10-CM

## 2012-07-29 DIAGNOSIS — Z8739 Personal history of other diseases of the musculoskeletal system and connective tissue: Secondary | ICD-10-CM | POA: Insufficient documentation

## 2012-07-29 DIAGNOSIS — Z8639 Personal history of other endocrine, nutritional and metabolic disease: Secondary | ICD-10-CM | POA: Insufficient documentation

## 2012-07-29 DIAGNOSIS — Z8742 Personal history of other diseases of the female genital tract: Secondary | ICD-10-CM | POA: Insufficient documentation

## 2012-07-29 DIAGNOSIS — R1084 Generalized abdominal pain: Secondary | ICD-10-CM | POA: Insufficient documentation

## 2012-07-29 DIAGNOSIS — Z8719 Personal history of other diseases of the digestive system: Secondary | ICD-10-CM | POA: Insufficient documentation

## 2012-07-29 DIAGNOSIS — Z79899 Other long term (current) drug therapy: Secondary | ICD-10-CM | POA: Insufficient documentation

## 2012-07-29 DIAGNOSIS — Z7982 Long term (current) use of aspirin: Secondary | ICD-10-CM | POA: Insufficient documentation

## 2012-07-29 DIAGNOSIS — Z853 Personal history of malignant neoplasm of breast: Secondary | ICD-10-CM | POA: Insufficient documentation

## 2012-07-29 DIAGNOSIS — E119 Type 2 diabetes mellitus without complications: Secondary | ICD-10-CM | POA: Insufficient documentation

## 2012-07-29 DIAGNOSIS — Z8673 Personal history of transient ischemic attack (TIA), and cerebral infarction without residual deficits: Secondary | ICD-10-CM | POA: Insufficient documentation

## 2012-07-29 DIAGNOSIS — F3289 Other specified depressive episodes: Secondary | ICD-10-CM | POA: Insufficient documentation

## 2012-07-29 DIAGNOSIS — F329 Major depressive disorder, single episode, unspecified: Secondary | ICD-10-CM | POA: Insufficient documentation

## 2012-07-29 DIAGNOSIS — Z7902 Long term (current) use of antithrombotics/antiplatelets: Secondary | ICD-10-CM | POA: Insufficient documentation

## 2012-07-29 DIAGNOSIS — Z862 Personal history of diseases of the blood and blood-forming organs and certain disorders involving the immune mechanism: Secondary | ICD-10-CM | POA: Insufficient documentation

## 2012-07-29 LAB — COMPREHENSIVE METABOLIC PANEL
BUN: 30 mg/dL — ABNORMAL HIGH (ref 6–23)
Calcium: 9.3 mg/dL (ref 8.4–10.5)
Creatinine, Ser: 0.89 mg/dL (ref 0.50–1.10)
GFR calc Af Amer: 71 mL/min — ABNORMAL LOW (ref >90)
Glucose, Bld: 225 mg/dL — ABNORMAL HIGH (ref 70–99)
Total Protein: 6.8 g/dL (ref 6.0–8.3)

## 2012-07-29 LAB — CBC WITH DIFFERENTIAL/PLATELET
Basophils Relative: 0 % (ref 0–1)
Eosinophils Absolute: 0 10*3/uL (ref 0.0–0.7)
Eosinophils Relative: 0 % (ref 0–5)
Hemoglobin: 13 g/dL (ref 12.0–15.0)
Lymphs Abs: 0.2 10*3/uL — ABNORMAL LOW (ref 0.7–4.0)
MCH: 31.5 pg (ref 26.0–34.0)
MCHC: 34.7 g/dL (ref 30.0–36.0)
MCV: 90.8 fL (ref 78.0–100.0)
Monocytes Relative: 4 % (ref 3–12)
RBC: 4.13 MIL/uL (ref 3.87–5.11)

## 2012-07-29 MED ORDER — SODIUM CHLORIDE 0.9 % IV SOLN
1000.0000 mL | Freq: Once | INTRAVENOUS | Status: AC
  Administered 2012-07-29: 1000 mL via INTRAVENOUS

## 2012-07-29 MED ORDER — DIPHENHYDRAMINE HCL 50 MG/ML IJ SOLN
25.0000 mg | Freq: Once | INTRAMUSCULAR | Status: AC
  Administered 2012-07-29: 25 mg via INTRAVENOUS
  Filled 2012-07-29: qty 1

## 2012-07-29 MED ORDER — MORPHINE SULFATE 4 MG/ML IJ SOLN
4.0000 mg | Freq: Once | INTRAMUSCULAR | Status: AC
  Administered 2012-07-29: 4 mg via INTRAVENOUS
  Filled 2012-07-29: qty 1

## 2012-07-29 MED ORDER — METOCLOPRAMIDE HCL 5 MG/ML IJ SOLN
10.0000 mg | Freq: Once | INTRAMUSCULAR | Status: AC
  Administered 2012-07-29: 10 mg via INTRAVENOUS
  Filled 2012-07-29: qty 2

## 2012-07-29 MED ORDER — PROMETHAZINE HCL 25 MG PO TABS: 25.0000 mg | ORAL_TABLET | Freq: Four times a day (QID) | ORAL | Status: AC | PRN

## 2012-07-29 MED ORDER — SODIUM CHLORIDE 0.9 % IV SOLN: 1000.0000 mL | INTRAVENOUS | Status: DC

## 2012-07-29 NOTE — ED Notes (Signed)
Per EMS- Pt comes from home c/o sudden onset n/v today. States feels like viral gastroenteritis she had earlier this year. Has pacemaker. Reports stomach is sore. Emesis x 2 , denies diarrhea. Vitals WNL.

## 2012-07-29 NOTE — ED Provider Notes (Signed)
History     CSN: [id number]  Arrival date & time 07/29/12  1611   First MD Initiated Contact with Patient 07/29/12 1622      Chief Complaint  Patient presents with  . Nausea  . Emesis    (Consider location/radiation/quality/duration/timing/severity/associated sxs/prior treatment) Patient is a 78 y.o. female presenting with vomiting. The history is provided by the patient.  Emesis She had onset this afternoon of nausea and vomiting. She is vomited twice. There is diffuse abdominal soreness and achiness which she rates at 8/10. She denies diarrhea. She had an ED visit 2 months ago for gastroenteritis and this feels similar with the exception that she has not vomited as many times and has not had any diarrhea. She denies fever, chills, sweats. She denies sick contacts and has not eaten any unusual foods recently. She has not had any treatment at home.  Past Medical History  Diagnosis Date  . Goiter   . Arthritis   . Dizziness - light-headed   . Family history of breast cancer     Did not specify what member of family per history form dated 02/15/10.  . Menopause   . Slow heart rate   . Hyperlipidemia   . PONV (postoperative nausea and vomiting)   . Hypertension     dr croitoru   SE  . Pacemaker     st jude  . Stroke     TIA per patient history form dated 02/15/10.  . Diabetes mellitus     Type 2  . Cancer     rt. breast ca  . Family history of anesthesia complication     Daughter Nausea/Vomitting  . Depression   . Familial tremor     takes Inderal  (tremor of head)  . Hyponatremia   . Rectal bleeding     Past Surgical History  Procedure Laterality Date  . Joint replacement      Knee - ask patient to clarify which knee or both.  . Brain surgery      Stent placement  . Gallbladder surgery    . Mastectomy    . Thyroid surgery      due to Goiter  . Cholecystectomy    . Total knee arthroplasty  2008    left  . Breast surgery  2009    lumpectomy  . Cardiac  catheterization    . Colonscopy      Family History  Problem Relation Age of Onset  . COPD Mother   . Cancer Father     lung, colon  . Cancer Maternal Aunt     breast  . Cancer Maternal Grandmother     breast    History  Substance Use Topics  . Smoking status: Never Smoker   . Smokeless tobacco: Never Used  . Alcohol Use: No    OB History   Grav Para Term Preterm Abortions TAB SAB Ect Mult Living                  Review of Systems  Gastrointestinal: Positive for vomiting.  All other systems reviewed and are negative.    Allergies  Oxycodone hcl er; Zofran; Crestor; Statins; and Zetia  Home Medications   Current Outpatient Rx  Name  Route  Sig  Dispense  Refill  . aspirin 81 MG EC tablet   Oral   Take 81 mg by mouth every morning.         . Cholecalciferol (VITAMIN D) 2000 UNITS tablet  Oral   Take 2,000 Units by mouth every morning.          . clopidogrel (PLAVIX) 75 MG tablet   Oral   Take 75 mg by mouth daily.         . Cyanocobalamin (VITAMIN B-12) 5000 MCG SUBL   Sublingual   Place 5,000 mcg under the tongue daily.         Marland Kitchen escitalopram (LEXAPRO) 10 MG tablet   Oral   Take 10 mg by mouth daily.          . fish oil-omega-3 fatty acids 1000 MG capsule   Oral   Take 1 g by mouth every morning.         Marland Kitchen glipiZIDE (GLUCOTROL XL) 10 MG 24 hr tablet   Oral   Take 10 mg by mouth daily with breakfast.          . lisinopril (PRINIVIL,ZESTRIL) 10 MG tablet   Oral   Take 10 mg by mouth every evening.          . Magnesium 500 MG TABS   Oral   Take 500 mg by mouth every evening.         . metFORMIN (GLUCOPHAGE) 500 MG tablet   Oral   Take 500 mg by mouth 2 (two) times daily.          . Multiple Vitamin (MULTIVITAMIN WITH MINERALS) TABS   Oral   Take 1 tablet by mouth every morning.         . propranolol (INDERAL) 40 MG tablet   Oral   Take 20-40 mg by mouth 2 (two) times daily. 1 tablet (40 mg) every morning and 1/2  tablet (20 mg) every evening         . psyllium (REGULOID) 0.52 G capsule   Oral   Take 0.52 g by mouth daily.           BP 160/71  Pulse 88  Temp(Src) 97.1 F (36.2 C) (Oral)  Resp 27  SpO2 94%  Physical Exam  Nursing note and vitals reviewed.  78 year old female, resting comfortably and in no acute distress. Vital signs are significant for hypertension with blood pressure 160/71, and tachypnea with respiratory rate of 27. Oxygen saturation is 94%, which is normal. Head is normocephalic and atraumatic. PERRLA, EOMI. Oropharynx is clear. Neck is nontender and supple without adenopathy or JVD. Back is nontender and there is no CVA tenderness. Lungs are clear without rales, wheezes, or rhonchi. Chest is nontender. Heart has regular rate and rhythm without murmur. Abdomen is soft, flat, with mild tenderness diffusely. There is no rebound or guarding. There are no masses or hepatosplenomegaly and peristalsis is hypoactive. Extremities have no cyanosis or edema, full range of motion is present. Skin is warm and dry without rash. Neurologic: Mental status is normal, cranial nerves are intact, there are no motor or sensory deficits.  ED Course  Procedures (including critical care time)  Results for orders placed during the hospital encounter of 07/29/12  CBC WITH DIFFERENTIAL      Result Value Range   WBC 8.6  4.0 - 10.5 K/uL   RBC 4.13  3.87 - 5.11 MIL/uL   Hemoglobin 13.0  12.0 - 15.0 g/dL   HCT 16.1  09.6 - 04.5 %   MCV 90.8  78.0 - 100.0 fL   MCH 31.5  26.0 - 34.0 pg   MCHC 34.7  30.0 - 36.0 g/dL   RDW  12.4  11.5 - 15.5 %   Platelets 159  150 - 400 K/uL   Neutrophils Relative % 94 (*) 43 - 77 %   Neutro Abs 8.1 (*) 1.7 - 7.7 K/uL   Lymphocytes Relative 2 (*) 12 - 46 %   Lymphs Abs 0.2 (*) 0.7 - 4.0 K/uL   Monocytes Relative 4  3 - 12 %   Monocytes Absolute 0.3  0.1 - 1.0 K/uL   Eosinophils Relative 0  0 - 5 %   Eosinophils Absolute 0.0  0.0 - 0.7 K/uL   Basophils  Relative 0  0 - 1 %   Basophils Absolute 0.0  0.0 - 0.1 K/uL  COMPREHENSIVE METABOLIC PANEL      Result Value Range   Sodium 134 (*) 135 - 145 mEq/L   Potassium 4.0  3.5 - 5.1 mEq/L   Chloride 98  96 - 112 mEq/L   CO2 21  19 - 32 mEq/L   Glucose, Bld 225 (*) 70 - 99 mg/dL   BUN 30 (*) 6 - 23 mg/dL   Creatinine, Ser 1.61  0.50 - 1.10 mg/dL   Calcium 9.3  8.4 - 09.6 mg/dL   Total Protein 6.8  6.0 - 8.3 g/dL   Albumin 3.5  3.5 - 5.2 g/dL   AST 0454 (*) 0 - 37 U/L   ALT 768 (*) 0 - 35 U/L   Alkaline Phosphatase 159 (*) 39 - 117 U/L   Total Bilirubin 2.3 (*) 0.3 - 1.2 mg/dL   GFR calc non Af Amer 61 (*) >90 mL/min   GFR calc Af Amer 71 (*) >90 mL/min      Date: 07/29/2012  Rate: 90  Rhythm: normal sinus rhythm  QRS Axis: left  Intervals: normal  ST/T Wave abnormalities: normal  Conduction Disutrbances:right bundle branch block and left anterior fascicular block  Narrative Interpretation: Right bundle branch block, left anterior fascicular block, probable old inferior wall myocardial infarction. When compared with ECG of 06/05/2012, no significant changes are seen.  Old EKG Reviewed: unchanged    1. Nausea & vomiting   2. Abdominal pain       MDM  Nausea and vomiting with abdominal soreness. Overall picture seems most consistent with a viral gastritis. WBC is come back normal although with a left shift. She'll be given IV fluids and IV ondansetron. Old records are reviewed and she does have a previous ED visits for similar presentation which was felt to be a viral gastroenteritis.  At the above-noted medications, she feels much better. Abdomen was reexamined and is continues to be soft and with only minimal tenderness. She is discharged with a prescription for promethazine and is to followup with her PCP.      Dione Booze, MD 07/29/12 [phone]

## 2012-07-29 NOTE — ED Notes (Signed)
Patient is alert and orientedx4.  Patient was explained discharge instructions and they understood them with no questions.  The patient's son-in-law, Wynona Meals is taking her home.

## 2012-07-29 NOTE — ED Notes (Signed)
Patient said she is constipated, had a bowel movement after she took a metamucil pill.  She said her BM was small and "hard as a rock".  The patient said she started vomiting after she took the metamucil pill.  She threw up twice and is still nauseated.  She says her pain is in the epigastric area and rates it a 10/10.

## 2012-08-02 ENCOUNTER — Other Ambulatory Visit: Payer: Self-pay | Admitting: Cardiovascular Disease

## 2012-08-02 DIAGNOSIS — I442 Atrioventricular block, complete: Secondary | ICD-10-CM

## 2012-08-02 DIAGNOSIS — I4891 Unspecified atrial fibrillation: Secondary | ICD-10-CM

## 2012-08-02 DIAGNOSIS — I495 Sick sinus syndrome: Secondary | ICD-10-CM

## 2012-08-02 LAB — PACEMAKER DEVICE OBSERVATION

## 2012-08-14 ENCOUNTER — Other Ambulatory Visit (HOSPITAL_COMMUNITY): Payer: Self-pay | Admitting: Interventional Radiology

## 2012-08-14 DIAGNOSIS — I771 Stricture of artery: Secondary | ICD-10-CM

## 2012-08-14 DIAGNOSIS — I639 Cerebral infarction, unspecified: Secondary | ICD-10-CM

## 2012-08-15 ENCOUNTER — Telehealth (HOSPITAL_COMMUNITY): Payer: Self-pay | Admitting: Interventional Radiology

## 2012-08-29 ENCOUNTER — Telehealth (HOSPITAL_COMMUNITY): Payer: Self-pay | Admitting: Interventional Radiology

## 2012-08-29 NOTE — Telephone Encounter (Signed)
Called pt left VM for her to call to schedule CT angio f/u study [name]

## 2012-08-29 NOTE — Telephone Encounter (Signed)
Called pt's daughter, left VM for her to call to schedule CT angio f/u study JMichaux

## 2012-09-03 ENCOUNTER — Encounter (INDEPENDENT_AMBULATORY_CARE_PROVIDER_SITE_OTHER): Payer: Self-pay | Admitting: Surgery

## 2012-09-04 ENCOUNTER — Ambulatory Visit (HOSPITAL_COMMUNITY): Payer: Medicare Other

## 2012-09-08 ENCOUNTER — Encounter (HOSPITAL_COMMUNITY): Payer: Self-pay

## 2012-09-08 ENCOUNTER — Ambulatory Visit (HOSPITAL_COMMUNITY)
Admission: RE | Admit: 2012-09-08 | Discharge: 2012-09-08 | Disposition: A | Discharge status: Home / Self Care | Payer: Medicare Other | Source: Ambulatory Visit | Attending: Interventional Radiology | Admitting: Interventional Radiology

## 2012-09-08 ENCOUNTER — Other Ambulatory Visit (HOSPITAL_COMMUNITY): Payer: Self-pay | Admitting: Interventional Radiology

## 2012-09-08 ENCOUNTER — Encounter: Payer: Self-pay | Admitting: *Deleted

## 2012-09-08 DIAGNOSIS — Z8673 Personal history of transient ischemic attack (TIA), and cerebral infarction without residual deficits: Secondary | ICD-10-CM | POA: Insufficient documentation

## 2012-09-08 DIAGNOSIS — H538 Other visual disturbances: Secondary | ICD-10-CM | POA: Insufficient documentation

## 2012-09-08 DIAGNOSIS — I639 Cerebral infarction, unspecified: Secondary | ICD-10-CM

## 2012-09-08 DIAGNOSIS — I771 Stricture of artery: Secondary | ICD-10-CM

## 2012-09-08 DIAGNOSIS — I998 Other disorder of circulatory system: Secondary | ICD-10-CM | POA: Insufficient documentation

## 2012-09-08 LAB — REMOTE PACEMAKER DEVICE
AL THRESHOLD: 0.75 V
ATRIAL PACING PM: 79
BAMS-0001: 150 {beats}/min
DEVICE MODEL PM: 7304673
RV LEAD THRESHOLD: 0.625 V
VENTRICULAR PACING PM: 99

## 2012-09-08 MED ORDER — IOHEXOL 350 MG/ML SOLN
50.0000 mL | Freq: Once | INTRAVENOUS | Status: AC | PRN
  Administered 2012-09-08: 50 mL via INTRAVENOUS

## 2012-09-08 NOTE — Procedures (Signed)
Successful placement of 4 Fr vascular sheath for temporary IV access for CTA of head and neck.  The IV is ready for immediate use.  No immediate complications.

## 2012-09-09 ENCOUNTER — Other Ambulatory Visit (HOSPITAL_COMMUNITY): Payer: Self-pay | Admitting: Interventional Radiology

## 2012-09-09 DIAGNOSIS — R42 Dizziness and giddiness: Secondary | ICD-10-CM

## 2012-09-09 DIAGNOSIS — H538 Other visual disturbances: Secondary | ICD-10-CM

## 2012-09-11 ENCOUNTER — Ambulatory Visit (HOSPITAL_COMMUNITY)
Admission: RE | Admit: 2012-09-11 | Discharge: 2012-09-11 | Disposition: A | Discharge status: Home / Self Care | Payer: Medicare Other | Source: Ambulatory Visit | Attending: Interventional Radiology | Admitting: Interventional Radiology

## 2012-09-11 DIAGNOSIS — H538 Other visual disturbances: Secondary | ICD-10-CM

## 2012-09-11 DIAGNOSIS — R42 Dizziness and giddiness: Secondary | ICD-10-CM

## 2012-09-26 ENCOUNTER — Encounter: Payer: Self-pay | Admitting: Cardiovascular Disease

## 2012-10-05 ENCOUNTER — Encounter: Payer: Self-pay | Admitting: *Deleted

## 2012-10-08 ENCOUNTER — Encounter: Payer: Self-pay | Admitting: Cardiovascular Disease

## 2012-10-08 ENCOUNTER — Other Ambulatory Visit: Payer: Self-pay | Admitting: Cardiovascular Disease

## 2012-10-08 ENCOUNTER — Ambulatory Visit (INDEPENDENT_AMBULATORY_CARE_PROVIDER_SITE_OTHER): Admitting: Cardiovascular Disease

## 2012-10-08 VITALS — BP 126/74 | HR 60 | Resp 16 | Ht 66.0 in | Wt 214.9 lb

## 2012-10-08 DIAGNOSIS — E119 Type 2 diabetes mellitus without complications: Secondary | ICD-10-CM

## 2012-10-08 DIAGNOSIS — I669 Occlusion and stenosis of unspecified cerebral artery: Secondary | ICD-10-CM

## 2012-10-08 DIAGNOSIS — I480 Paroxysmal atrial fibrillation: Secondary | ICD-10-CM

## 2012-10-08 DIAGNOSIS — E785 Hyperlipidemia, unspecified: Secondary | ICD-10-CM

## 2012-10-08 DIAGNOSIS — R001 Bradycardia, unspecified: Secondary | ICD-10-CM

## 2012-10-08 DIAGNOSIS — I498 Other specified cardiac arrhythmias: Secondary | ICD-10-CM

## 2012-10-08 DIAGNOSIS — I4891 Unspecified atrial fibrillation: Secondary | ICD-10-CM

## 2012-10-08 DIAGNOSIS — I6601 Occlusion and stenosis of right middle cerebral artery: Secondary | ICD-10-CM

## 2012-10-08 DIAGNOSIS — Z950 Presence of cardiac pacemaker: Secondary | ICD-10-CM

## 2012-10-08 LAB — PACEMAKER DEVICE OBSERVATION
AL AMPLITUDE: 1.5 mv
ATRIAL PACING PM: 77
BAMS-0001: 150 {beats}/min
BATTERY VOLTAGE: 2.98 V
RV LEAD IMPEDENCE PM: 550 Ohm
VENTRICULAR PACING PM: 99

## 2012-10-08 NOTE — Patient Instructions (Addendum)
Remote monitoring is used to monitor your Pacemaker of ICD from home. This monitoring reduces the number of office visits required to check your device to one time per year. It allows [place] to keep an eye on the functioning of your device to ensure it is working properly. You are scheduled for a device check from home on 01-12-2013. You may send your transmission at any time that day. If you have a wireless device, the transmission will be sent automatically. After your physician reviews your transmission, you will receive a postcard with your next transmission date.   Your physician recommends that you schedule a follow-up appointment in: 1 year

## 2012-10-10 DIAGNOSIS — I6601 Occlusion and stenosis of right middle cerebral artery: Secondary | ICD-10-CM | POA: Insufficient documentation

## 2012-10-10 DIAGNOSIS — E785 Hyperlipidemia, unspecified: Secondary | ICD-10-CM | POA: Insufficient documentation

## 2012-10-10 DIAGNOSIS — I480 Paroxysmal atrial fibrillation: Secondary | ICD-10-CM | POA: Insufficient documentation

## 2012-10-10 NOTE — Progress Notes (Signed)
Patient ID: [name], female   DOB: [birth year], 78 y.o.   MRN: [id number]      Reason for office visit Pacemaker checks, atrial fibrillation  [name] has had recurrent problems with strokes and transient neurological events related to high-grade stenosis and in-stent restenosis in her right middle cerebral artery. Over the last year she has had a couple of recent interventions with angioplasty for in-stent restenosis. Fortunately she has not had a new permanent neurological events. As before she has had intermittent episodes of severe nausea and vomiting, probably of biliary etiology. She has diabetes mellitus. She has not had any symptoms of palpitations syncope or any bleeding problems. She has functional class II shortness of breath on exertion but leads a fairly sedentary life.  Interrogation of her pacemaker today shows normal function. Although she is not technically pacemaker dependent she paces roughly 100% of the time in the ventricle. No episodes of atrial fibrillation have been recorded, although brief atrial tachycardia is seen.    Allergies  Allergen Reactions  . Oxycodone Hcl Er Nausea And Vomiting  . Psyllium   . Zofran (Ondansetron Hcl) Swelling  . Crestor (Rosuvastatin Calcium) Other (See Comments)    dizziness  . Statins Other (See Comments)    Cramping in legs  . Zetia (Ezetimibe) Other (See Comments)    dizziness    Current Outpatient Prescriptions  Medication Sig Dispense Refill  . aspirin 81 MG EC tablet Take 81 mg by mouth every morning.      . Cholecalciferol (VITAMIN D) 2000 UNITS tablet Take 2,000 Units by mouth every morning.       . clopidogrel (PLAVIX) 75 MG tablet Take 75 mg by mouth daily.      . Cyanocobalamin (VITAMIN B-12) 5000 MCG SUBL Place 5,000 mcg under the tongue daily.      Marland Kitchen escitalopram (LEXAPRO) 10 MG tablet Take 10 mg by mouth daily.       . fish oil-omega-3 fatty acids 1000 MG capsule Take 1 g by mouth every morning.      Marland Kitchen  glipiZIDE (GLUCOTROL XL) 10 MG 24 hr tablet Take 10 mg by mouth daily with breakfast.       . lisinopril (PRINIVIL,ZESTRIL) 10 MG tablet Take 10 mg by mouth every evening.       . Magnesium 500 MG TABS Take 500 mg by mouth every evening.      . metFORMIN (GLUCOPHAGE) 500 MG tablet Take 500 mg by mouth 2 (two) times daily.       . Multiple Vitamin (MULTIVITAMIN WITH MINERALS) TABS Take 1 tablet by mouth every morning.      . promethazine (PHENERGAN) 25 MG tablet Take 1 tablet (25 mg total) by mouth every 6 (six) hours as needed for nausea.  30 tablet  0  . propranolol (INDERAL) 40 MG tablet Take 40 mg by mouth 2 (two) times daily.       . [DISCONTINUED] insulin glargine (LANTUS SOLOSTAR) 100 UNIT/ML injection Inject 5 Units into the skin daily as needed. For blood sugar over 120       No current facility-administered medications for this visit.    Past Medical History  Diagnosis Date  . Goiter   . Arthritis   . Dizziness - light-headed   . Breast cancer   . Menopause   . Slow heart rate   . Hyperlipidemia   . PONV (postoperative nausea and vomiting)   . Hypertension     dr Chalisa Kobler  SE  . Sinus node dysfunction 03/27/2011    St.Jude pacemaker  . Stroke     TIA per patient history form dated 02/15/10.  . Diabetes mellitus     Type 2  . Cancer     rt. breast ca  . Family history of anesthesia complication     Daughter Nausea/Vomitting  . Depression   . Familial tremor     takes Inderal  (tremor of head)  . Hyponatremia   . Rectal bleeding   . Atrial tachycardia     Past Surgical History  Procedure Laterality Date  . Joint replacement      Knee - ask patient to clarify which knee or both.  . Brain surgery  03/2009    Stent placement  . Gallbladder surgery    . Mastectomy  2008  . Thyroid surgery      due to Goiter  . Cholecystectomy    . Total knee arthroplasty  2008    left  . Breast surgery  2009    lumpectomy  . Cardiac catheterization  07/24/2010    no CAD  .  Colonscopy    . Permanent pacemaker insertion  03/27/2011    St.Jude    Family History  Problem Relation Age of Onset  . COPD Mother   . Cancer Father     lung, colon  . Cancer Maternal Aunt     breast  . Cancer Maternal Grandmother     breast    History   Social History  . Marital Status: Widowed    Spouse Name: N/A    Number of Children: N/A  . Years of Education: N/A   Occupational History  . Not on file.   Social History Main Topics  . Smoking status: Never Smoker   . Smokeless tobacco: Never Used  . Alcohol Use: No  . Drug Use: No  . Sexually Active: No   Other Topics Concern  . Not on file   Social History Narrative  . No narrative on file    Review of systems: The patient specifically denies any chest pain at rest or with exertion, dyspnea at rest, orthopnea, paroxysmal nocturnal dyspnea, syncope, intermittent claudication, lower extremity edema, unexplained weight gain, cough, hemoptysis or wheezing.  The patient also denies abdominal pain, dysphagia, diarrhea, constipation, polyuria, polydipsia, dysuria, hematuria, frequency, urgency, abnormal bleeding or bruising, fever, chills, unexpected weight changes, mood swings, change in skin or hair texture, change in voice quality, auditory or visual problems, allergic reactions or rashes, new musculoskeletal complaints other than usual "aches and pains".   PHYSICAL EXAM BP 126/74  Pulse 60  Resp 16  Ht 5\' 6"  (1.676 m)  Wt 214 lb 14.4 oz (97.478 kg)  BMI 34.7 kg/m2  General: Alert, oriented x3, no distress Head: no evidence of trauma, PERRL, EOMI, no exophtalmos or lid lag, no myxedema, no xanthelasma; normal ears, nose and oropharynx Neck: normal jugular venous pulsations and no hepatojugular reflux; brisk carotid pulses without delay and no carotid bruits Chest: clear to auscultation, no signs of consolidation by percussion or palpation, normal fremitus, symmetrical and full respiratory excursions; healthy  left subclavian pacemaker site Cardiovascular: normal position and quality of the apical impulse, regular rhythm, normal first and second heart sounds, no murmurs, rubs or gallops Abdomen: no tenderness or distention, no masses by palpation, no abnormal pulsatility or arterial bruits, normal bowel sounds, no hepatosplenomegaly Extremities: no clubbing, cyanosis or edema; 2+ radial, ulnar and brachial pulses bilaterally; 2+ right  femoral, posterior tibial and dorsalis pedis pulses; 2+ left femoral, posterior tibial and dorsalis pedis pulses; no subclavian or femoral bruits Neurological: grossly nonfocal   EKG: Atrioventricular sequential pacing  Lipid Panel     Component Value Date/Time   CHOL 226* 05/21/2011 0615   TRIG 182* 05/21/2011 0615   HDL 38* 05/21/2011 0615   CHOLHDL 5.9 05/21/2011 0615   VLDL 36 05/21/2011 0615   LDLCALC 152* 05/21/2011 0615    BMET    Component Value Date/Time   NA 134* 07/29/2012 1626   NA 135* 11/28/2011 1025   K 4.0 07/29/2012 1626   K 4.6 11/28/2011 1025   CL 98 07/29/2012 1626   CL 97* 11/28/2011 1025   CO2 21 07/29/2012 1626   CO2 27 11/28/2011 1025   GLUCOSE 225* 07/29/2012 1626   GLUCOSE 225* 11/28/2011 1025   BUN 30* 07/29/2012 1626   BUN 19.0 11/28/2011 1025   CREATININE 0.89 07/29/2012 1626   CREATININE 1.1 11/28/2011 1025   CALCIUM 9.3 07/29/2012 1626   CALCIUM 10.0 11/28/2011 1025   GFRNONAA 61* 07/29/2012 1626   GFRAA 71* 07/29/2012 1626     ASSESSMENT AND PLAN Pacemaker Dual chamber St. Jude accent DR RF 2210 implanted January, 2013 for sinus node dysfunction as well as AV node conduction disease. Device function is normal with excellent lead parameters. Note virtually 100% ventricular pacing as well as high frequency of atrial pacing. Occasional brief bursts of atrial tachycardia have been recorded but there is no evidence of atrial fibrillation. No changes are made to the device settings. Will continue followup every 3 months he has a remote merlin  system. In office check in one year  DM (diabetes mellitus), IDDM  Adequate control with most recent hemoglobin A1c 6.9%  Hyperlipidemia, intolerant to statins and zetia    Stenosis of right middle cerebral artery Status post repeat revascularization for in-stent restenosis   Orders Placed This Encounter  Procedures  . EKG 12-Lead   No orders of the defined types were placed in this encounter.    Junious Silk, MD, [facility] [phone] office [phone] pager

## 2012-10-10 NOTE — Assessment & Plan Note (Signed)
Dual chamber St. Jude accent DR RF 2210 implanted January, 2013 for sinus node dysfunction as well as AV node conduction disease. Device function is normal with excellent lead parameters. Note virtually 100% ventricular pacing as well as high frequency of atrial pacing. Occasional brief bursts of atrial tachycardia have been recorded but there is no evidence of atrial fibrillation. No changes are made to the device settings. Will continue followup every 3 months he has a remote merlin system. In office check in one year

## 2012-10-10 NOTE — Assessment & Plan Note (Addendum)
Adequate control with most recent hemoglobin A1c 6.9%

## 2012-10-10 NOTE — Assessment & Plan Note (Signed)
Status post repeat revascularization for in-stent restenosis

## 2012-11-21 ENCOUNTER — Encounter (INDEPENDENT_AMBULATORY_CARE_PROVIDER_SITE_OTHER): Payer: Self-pay | Admitting: Surgery

## 2012-11-21 ENCOUNTER — Ambulatory Visit (INDEPENDENT_AMBULATORY_CARE_PROVIDER_SITE_OTHER): Payer: Medicare Other | Admitting: Surgery

## 2012-11-21 VITALS — BP 118/78 | HR 78 | Temp 98.2°F | Resp 15 | Ht 66.0 in | Wt 212.8 lb

## 2012-11-21 DIAGNOSIS — Z853 Personal history of malignant neoplasm of breast: Secondary | ICD-10-CM

## 2012-11-21 NOTE — Progress Notes (Signed)
[facility]  Ovidio Kin, MD,  FACS [address] [place]  [address] [place], [place]    [address] Phone:  [phone] FAX:  [phone]   Re:   [name] DOB:   [birth year] MRN:   [id number]  ASSESSMENT AND PLAN: 1.  Right breast cancer, T3, N1 (7/20 nodes).  Right mastectomy - 02/27/2007.  Treating oncologist - Welton Flakes Donnie Coffin)  Stopped Femara in 2013 (at 5 years).  She asked whether she ought to still be on this.  Disease free.  Return to see me in 1 year.  2.  S/P stroke with some balance issues.  TIA - 02/15/2010.  Stroke - March 2013.  Stenosis of right middle cerebral artery.  Stent placed by Dr. Corliss Skains - is being followed by him. 3.  Pacemaker placed by Dr. Royann Shivers August 2013. 4.  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  Dr. Chip Boer and [facility].  Went to [facility] and sounds like she got an ERCP with sphincterotomy - Jan 2013 5.  Elevated LFT's.  Unknown reason. 6.  Right knee bothers her.  She has patella on bone, but her ortho, Dr. Julius Bowels in [place], thinks she is not a good candidate for surgery. 7.  Diabetes on Glucotrol  HISTORY OF PRESENT ILLNESS: Chief Complaint  Patient presents with  . Breast Cancer Long Term Follow Up    yearly br rech   [name] is a 78 y.o. (DOB: 05/10/1934)  white female who is a patient of BURNETT,BRENT A, MD and comes to me today for folow up of right breast cancer.  She is doing well.  Her speech pattern is little slow.  She is still working as an Airline pilot. We talked about continuing vs discontinuing the Femara. She is trying to exercise/walk more, but her right knee is a limiting factor.  Current Outpatient Prescriptions  Medication Sig Dispense Refill  . aspirin 81 MG EC tablet Take 81 mg by mouth every morning.      . Cholecalciferol (VITAMIN D) 2000 UNITS tablet Take 2,000 Units by mouth every morning.       . clopidogrel (PLAVIX) 75 MG tablet Take 75 mg by mouth daily.       . Cyanocobalamin (VITAMIN B-12) 5000 MCG SUBL Place 5,000 mcg under the tongue daily.      Marland Kitchen escitalopram (LEXAPRO) 10 MG tablet Take 10 mg by mouth daily.       . fish oil-omega-3 fatty acids 1000 MG capsule Take 1 g by mouth every morning.      Marland Kitchen glipiZIDE (GLUCOTROL XL) 10 MG 24 hr tablet Take 10 mg by mouth daily with breakfast.       . lisinopril (PRINIVIL,ZESTRIL) 10 MG tablet Take 10 mg by mouth every evening.       . Magnesium 500 MG TABS Take 500 mg by mouth every evening.      . metFORMIN (GLUCOPHAGE) 500 MG tablet Take 500 mg by mouth 2 (two) times daily.       . Multiple Vitamin (MULTIVITAMIN WITH MINERALS) TABS Take 1 tablet by mouth every morning.      . promethazine (PHENERGAN) 25 MG tablet Take 1 tablet (25 mg total) by mouth every 6 (six) hours as needed for nausea.  30 tablet  0  . propranolol (INDERAL) 40 MG tablet Take 40 mg by mouth 2 (two) times daily.       . [DISCONTINUED] insulin glargine (LANTUS SOLOSTAR) 100  UNIT/ML injection Inject 5 Units into the skin daily as needed. For blood sugar over 120       No current facility-administered medications for this visit.     Social History: Husband died in 08-12-2007. 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  PHYSICAL EXAM: BP 118/78  Pulse 78  Temp(Src) 98.2 F (36.8 C) (Temporal)  Resp 15  Ht 5\' 6"  (1.676 m)  Wt 212 lb 12.8 oz (96.525 kg)  BMI 34.36 kg/m2  HEENT:  Pupils equal. She is missing several teeth in front. NECK:  Supple.  Scar at the base of her neck. LYMPH NODES:  No cervical, supraclavicular, or axillary adenopathy. CHEST- Left subclavian Pacemaker BREASTS -  RIGHT:  Absent, some redundant skin.  She as some neovascularization bout 3 cm out from mastectomy scar.  No palpable mass or nodule.    LEFT:  No palpable mass or nodule.  No nipple discharge. UPPER EXTREMITIES:  No evidence of lymphedema.  DATA REVIEWED: Mammograms ([facility]) - 04/28/2012 - Negative  Ovidio Kin,  MD, FACS Office:  [phone]

## 2012-12-15 IMAGING — CT CT ABD-PELV W/ CM
1 of 2 series · 15 of 32 positions shown, 19 images · IV contrast (omnipaque)
Comparison: MRI 10/31/2006

CLINICAL DATA: Abdominal pain with nausea and vomiting

CT ABDOMEN AND PELVIS WITH CONTRAST
TECHNIQUE: Multidetector CT imaging of the abdomen and pelvis was
performed following the standard protocol during bolus
administration of intravenous contrast.
Contrast: 100 ml Omnipaque 300

[Series 2: rtn ap with st · axial · 0.78mm/px · z∈[-444,-8]mm · 15 of 97 slices shown, 19 images]
[im 5/97  soft-tissue]
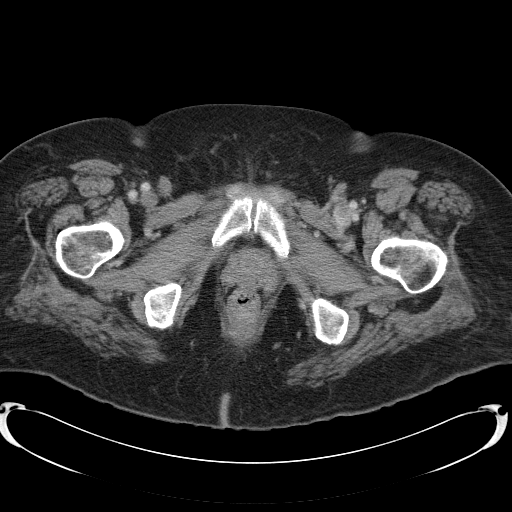
[im 5/97  bone]
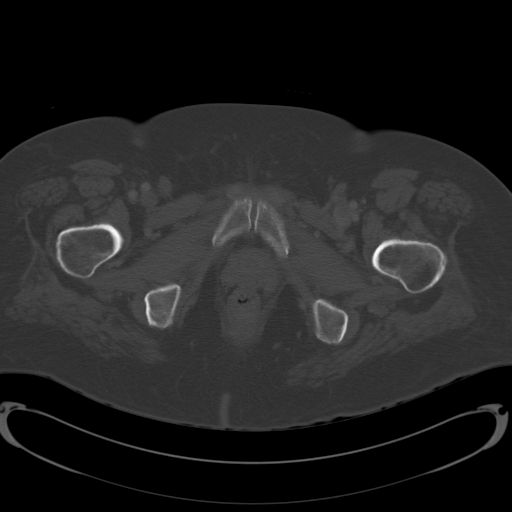
[im 13/97  soft-tissue]
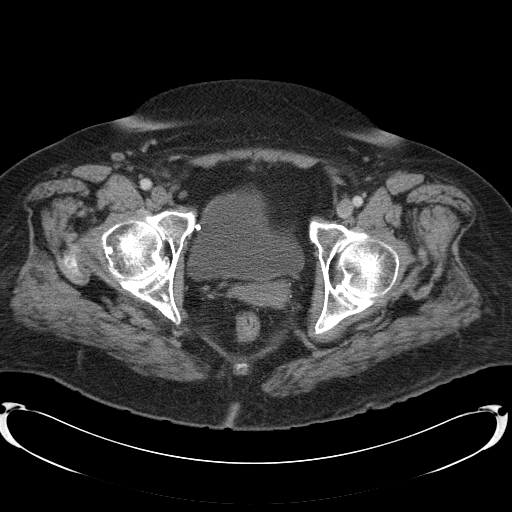
[im 21/97  soft-tissue]
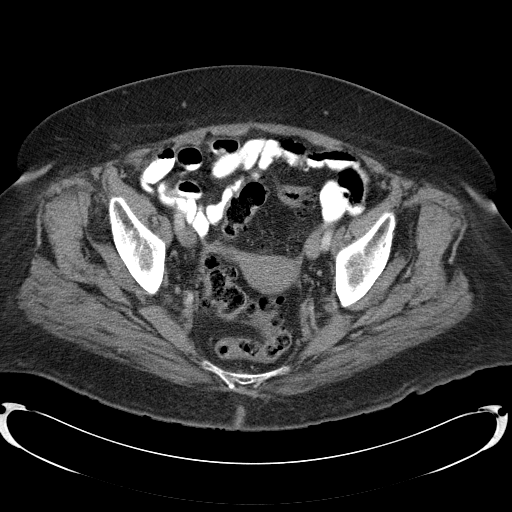
[im 26/97  soft-tissue]
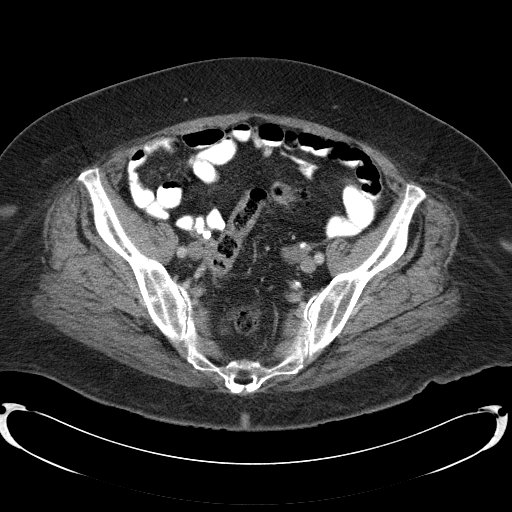
[im 34/97  soft-tissue]
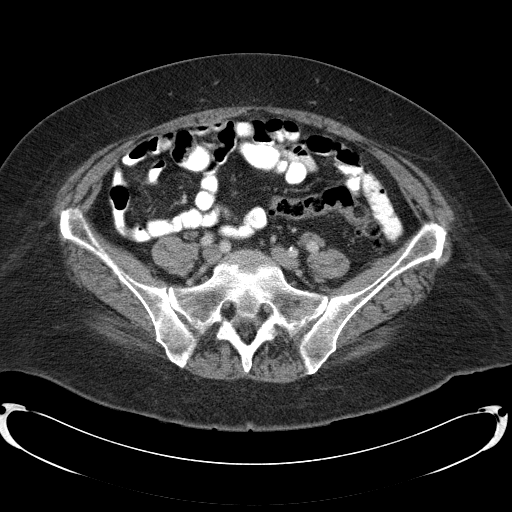
[im 42/97  soft-tissue]
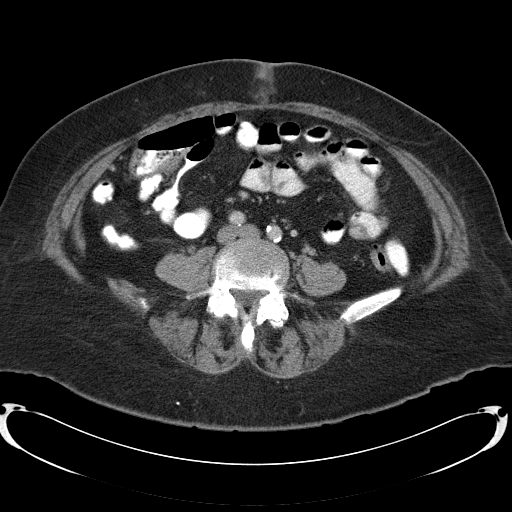
[im 51/97  soft-tissue]
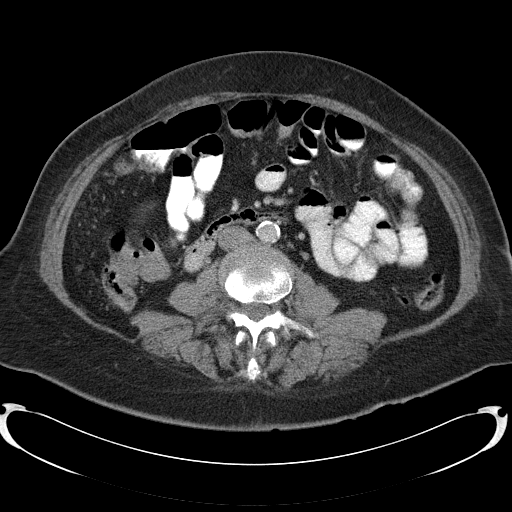
[im 55/97  soft-tissue]
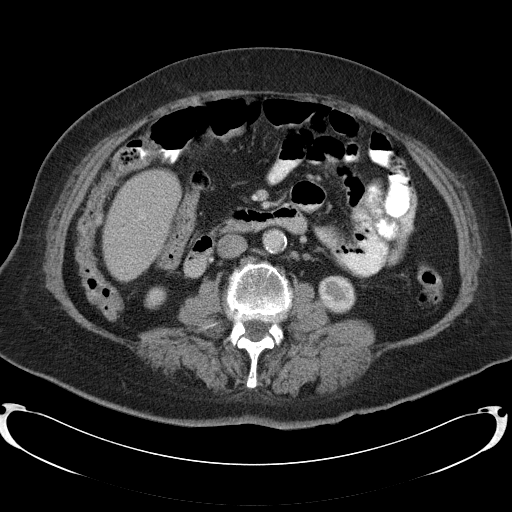
[im 63/97  soft-tissue]
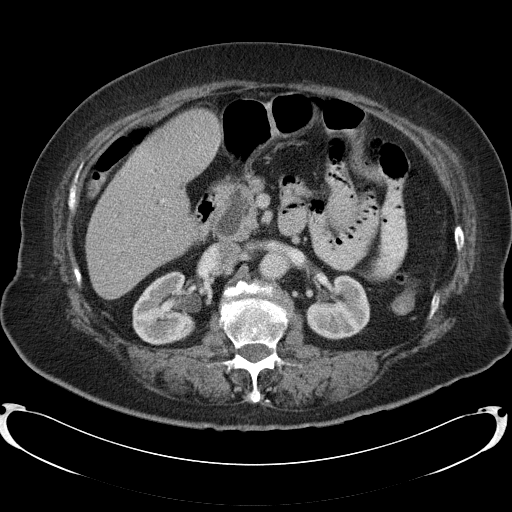
[im 63/97  bone]
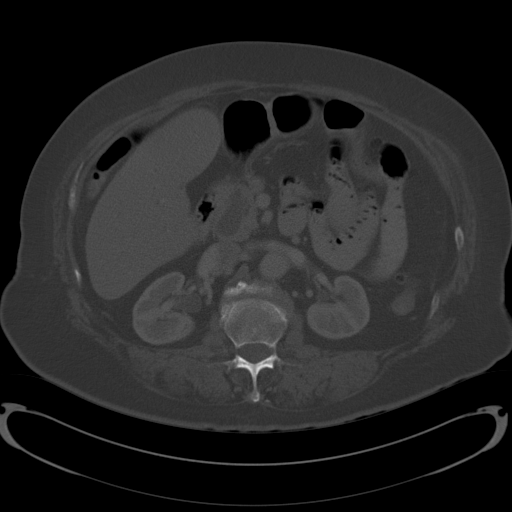
[im 71/97  soft-tissue]
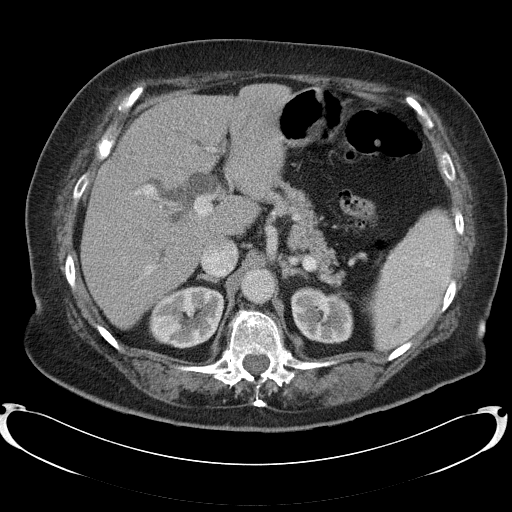
[im 76/97  soft-tissue]
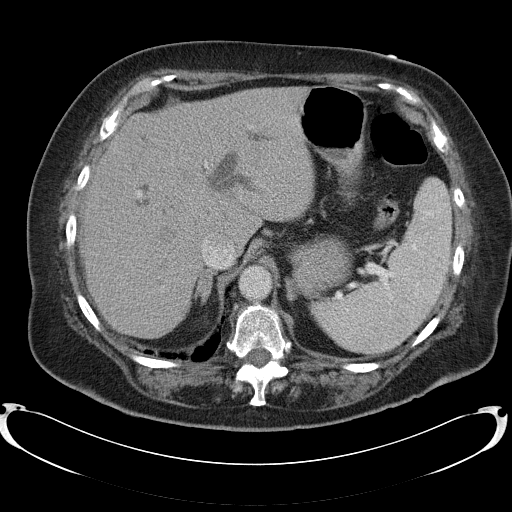
[im 80/97  lung]
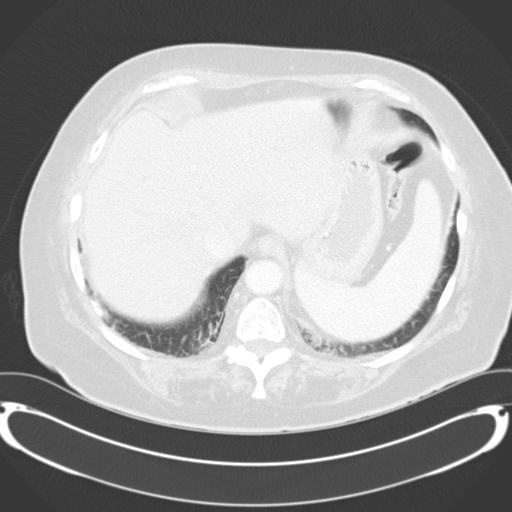
[im 84/97  soft-tissue]
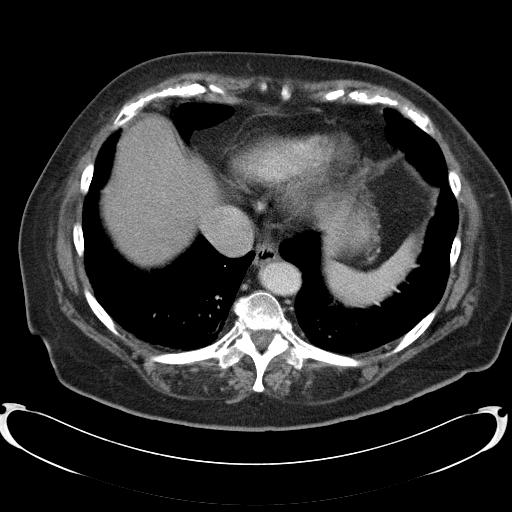
[im 84/97  lung]
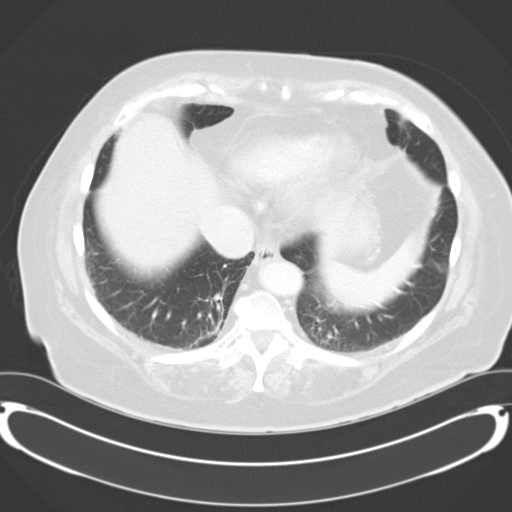
[im 88/97  lung]
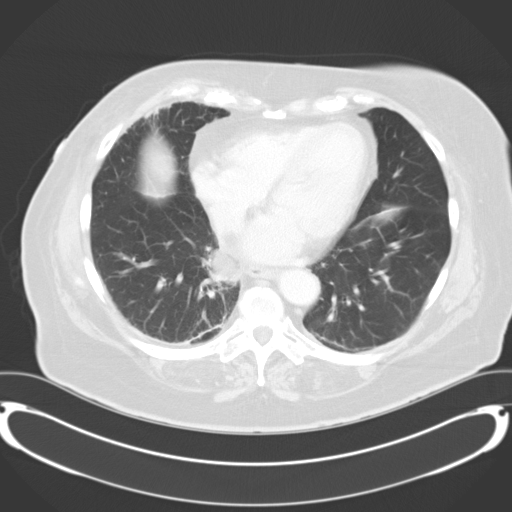
[im 92/97  soft-tissue]
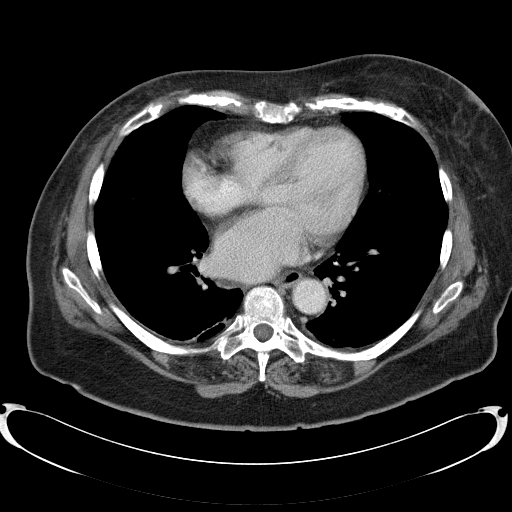
[im 92/97  lung]
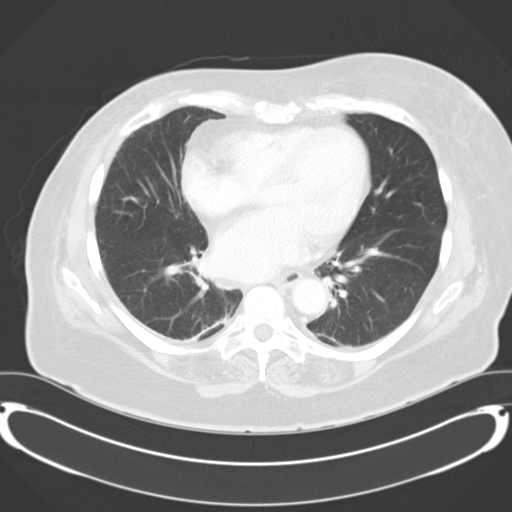

[15 of 32 positions shown; findings below may reference images not displayed]

FINDINGS: Lung bases are clear.  No pericardial fluid.

There is intrahepatic and extrahepatic biliary duct dilatation
which is similar to prior.  There is dilatation of the common bile
duct up to 13 mm which compares 12 mm on prior.  No focal hepatic
lesion is present.  Cystic lesion in the posterior superior aspect
of the uncinate process measures 16 by 16 mm (image 32) increased
slightly from 13 by 14 mm on prior.  No evidence of pancreatic
ductal dilatation.  Tiny hypodensity the spleen is similar to
comparison.

The adrenal glands and kidneys are normal.  The stomach, duodenum,
small bowel, and cecum are normal.  The appendix is normal.  The
colon is predominately collapsed.  There is diverticular disease
throughout the descending colon and sigmoid colon.  No clear
evidence of acute inflammation.

Abdominal aorta is normal caliber. 10 mm periportal lymph nodes are
unchanged from prior (image 38).

No free fluid the pelvis.  The bladder and uterus and adnexa are
normal for age.  No pelvic lymphadenopathy. Study is technically
adequate.  There are no filling defects within the pulmonary
arteries to suggest acute pulmonary embolism.
IMPRESSION: 1.  No clear acute abdominal or pelvic process.
2.  Biliary ductal dilatation and common bile duct dilatation are
similar to prior MRI and likely related to prior cholecystectomy.
3.  Cystic lesion within the  pancreas is only slightly enlarged
over a 4-year interval which suggests a benign etiology.
4.  Extensive sigmoid diverticulosis without clear evidence of
acute diverticulitis.

## 2012-12-16 ENCOUNTER — Ambulatory Visit (INDEPENDENT_AMBULATORY_CARE_PROVIDER_SITE_OTHER): Payer: Medicare Other | Admitting: Cardiovascular Disease

## 2012-12-16 ENCOUNTER — Encounter: Payer: Self-pay | Admitting: Cardiovascular Disease

## 2012-12-16 VITALS — BP 120/82 | HR 76 | Resp 16 | Ht 66.0 in | Wt 206.2 lb

## 2012-12-16 DIAGNOSIS — R001 Bradycardia, unspecified: Secondary | ICD-10-CM

## 2012-12-16 DIAGNOSIS — Z950 Presence of cardiac pacemaker: Secondary | ICD-10-CM

## 2012-12-16 DIAGNOSIS — I4891 Unspecified atrial fibrillation: Secondary | ICD-10-CM

## 2012-12-16 DIAGNOSIS — I447 Left bundle-branch block, unspecified: Secondary | ICD-10-CM

## 2012-12-16 DIAGNOSIS — I498 Other specified cardiac arrhythmias: Secondary | ICD-10-CM

## 2012-12-16 DIAGNOSIS — I480 Paroxysmal atrial fibrillation: Secondary | ICD-10-CM

## 2012-12-16 DIAGNOSIS — Z79899 Other long term (current) drug therapy: Secondary | ICD-10-CM

## 2012-12-16 DIAGNOSIS — R0609 Other forms of dyspnea: Secondary | ICD-10-CM

## 2012-12-16 DIAGNOSIS — R0602 Shortness of breath: Secondary | ICD-10-CM

## 2012-12-16 LAB — BASIC METABOLIC PANEL
BUN: 16 mg/dL (ref 6–23)
Chloride: 92 mEq/L — ABNORMAL LOW (ref 96–112)
Creat: 0.92 mg/dL (ref 0.50–1.10)

## 2012-12-16 NOTE — Patient Instructions (Addendum)
Your physician recommends that you return for lab work as soon as possible.  Your physician has requested that you have an echocardiogram. Echocardiography is a painless test that uses sound waves to create images of your heart. It provides your doctor with information about the size and shape of your heart and how well your heart's chambers and valves are working. This procedure takes approximately one hour. There are no restrictions for this procedure.  A chest x-ray takes a picture of the organs and structures inside the chest, including the heart, lungs, and blood vessels. This test can show several things, including, whether the heart is enlarges; whether fluid is building up in the lungs; and whether pacemaker / defibrillator leads are still in place.  Your physician recommends that you schedule a follow-up appointment in: 3 months

## 2012-12-17 LAB — BRAIN NATRIURETIC PEPTIDE: Brain Natriuretic Peptide: 52.4 pg/mL (ref 0.0–100.0)

## 2012-12-18 LAB — PACEMAKER DEVICE OBSERVATION
AL AMPLITUDE: 2.7 mv
AL IMPEDENCE PM: 460 Ohm
AL THRESHOLD: 0.625 V
ATRIAL PACING PM: 77
BAMS-0001: 150 {beats}/min
DEVICE MODEL PM: 7304673
RV LEAD IMPEDENCE PM: 590 Ohm
RV LEAD THRESHOLD: 0.5 V

## 2012-12-19 ENCOUNTER — Encounter: Payer: Self-pay | Admitting: Cardiovascular Disease

## 2012-12-19 NOTE — Assessment & Plan Note (Signed)
Asymptomatic, recorded by pacemaker in the past, none recently. Note that the atrial fibrillation occurred in the setting of severe metabolic decompensation due to hyperglycemia. This point antiplatelet therapy for intracranial arterial disease appears more important and warfarin anticoagulation. I think the risk of combined anticoagulant and antiplatelet therapy would be unexpectedly high in this elderly lady. Once there are no more issues with recurrent intracranial restenosis we will reconsider whether or not warfarin should be started depending on the prevalence of atrial fibrillation.

## 2012-12-19 NOTE — Assessment & Plan Note (Signed)
No clinical signs of hypervolemia. Weight has substantially decreased rather than increased as one would expect with CHF exacerbation. No sustained arrhythmia is seen. Overall, cardiac etiology of dyspnea appears to be doubtful. Check echo and CXR.

## 2012-12-19 NOTE — Assessment & Plan Note (Signed)
Opportunity to upgrade to CRT-P in the future, if EF deteriorates.

## 2012-12-19 NOTE — Assessment & Plan Note (Addendum)
Dual chamber [employer] accent DR RF 2210 implanted January 2013 for sinus node dysfunction as well as AV node conduction disease.Normal device function.  104 mode switches(<1%)---max duration of approximately , Max A 179, Max V  94 No high ventricular rates noted.  Estimated longevity 8-9 years.  VIP was D/C: patient presented with 99% VP. Reprogrammed to physiological AV delays. Patient will follow up remotely on 03-19-2013, and with me in 3 months.

## 2012-12-19 NOTE — Progress Notes (Signed)
Patient ID: [name], female   DOB: [birth year], 78 y.o.   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 Over the last year she has had a couple of repeat interventions with angioplasty for in-stent restenosis. Fortunately she has not had a new permanent neurological events. She has had intermittent episodes of severe nausea and vomiting, probably of biliary etiology (sphincter of Oddi dysfunction). She has diabetes mellitus. She has not had any symptoms of palpitations syncope or any bleeding problems.   Typically, she has functional class II shortness of breath on exertion but leads a fairly sedentary life. Over the last 1-2 weeks she has had substantial worsening (class III dyspnea), but no dyspnea at rest.  Interrogation of her pacemaker today shows normal function. Although she is not technically pacemaker dependent she paces roughly 100% of the time in the ventricle, even with VIP on and maximum AV delays.. No episodes of atrial fibrillation have been recorded, although brief atrial tachycardia is seen.  Allergies  Allergen Reactions  . Oxycodone Hcl Nausea And Vomiting  . Psyllium   . Zofran [Ondansetron Hcl] Swelling  . Crestor [Rosuvastatin Calcium] Other (See Comments)    dizziness  . Statins Other (See Comments)    Cramping in legs  . Zetia [Ezetimibe] Other (See Comments)    dizziness    Current Outpatient Prescriptions  Medication Sig Dispense Refill  . aspirin 81 MG EC tablet Take 81 mg by mouth every morning.      . Cholecalciferol (VITAMIN D) 2000 UNITS tablet Take 2,000 Units by mouth every morning.       . clopidogrel (PLAVIX) 75 MG tablet Take 75 mg by mouth daily.      . Cyanocobalamin (VITAMIN B-12) 5000 MCG SUBL Place 5,000 mcg under the tongue daily.      Marland Kitchen  escitalopram (LEXAPRO) 10 MG tablet Take 10 mg by mouth daily.       . fish oil-omega-3 fatty acids 1000 MG capsule Take 1 g by mouth every morning.      Marland Kitchen glipiZIDE (GLUCOTROL XL) 10 MG 24 hr tablet Take 10 mg by mouth daily with breakfast.       . letrozole (FEMARA) 2.5 MG tablet Take 2.5 mg by mouth daily.      Marland Kitchen lisinopril (PRINIVIL,ZESTRIL) 10 MG tablet Take 10 mg by mouth every evening.       . Magnesium 500 MG TABS Take 500 mg by mouth every evening.      . meclizine (ANTIVERT) 25 MG tablet Take 25 mg by mouth 3 (three) times daily as needed.      . metFORMIN (GLUCOPHAGE) 500 MG tablet Take 500 mg by mouth 2 (two) times daily.       . Multiple Vitamin (MULTIVITAMIN WITH MINERALS) TABS Take 1 tablet by mouth every morning.      . nitrofurantoin (MACRODANTIN) 100 MG capsule Take 100 mg by mouth 2 (two) times daily.      . promethazine (PHENERGAN) 25 MG tablet Take 1 tablet (25 mg total) by mouth every 6 (six) hours as needed for nausea.  30 tablet  0  . propranolol (INDERAL) 40 MG tablet Take 40 mg by mouth 2 (two) times daily.       . traMADol (ULTRAM) 50 MG tablet Take 50 mg by mouth every  6 (six) hours as needed for pain.      . [DISCONTINUED] insulin glargine (LANTUS SOLOSTAR) 100 UNIT/ML injection Inject 5 Units into the skin daily as needed. For blood sugar over 120       No current facility-administered medications for this visit.    Past Medical History  Diagnosis Date  . Goiter   . Arthritis   . Dizziness - light-headed   . Breast cancer   . Menopause   . Slow heart rate   . Hyperlipidemia   . PONV (postoperative nausea and vomiting)   . Hypertension     dr Ty Buntrock   SE  . Sinus node dysfunction 03/27/2011    [employer]Jude pacemaker  . Stroke     TIA per patient history form dated 02/15/10.  . Diabetes mellitus     Type 2  . Cancer     rt. breast ca  . Family history of anesthesia complication     Daughter Nausea/Vomitting  . Depression   . Familial tremor     takes  Inderal  (tremor of head)  . Hyponatremia   . Rectal bleeding   . Atrial tachycardia     Past Surgical History  Procedure Laterality Date  . Joint replacement      Knee - ask patient to clarify which knee or both.  . Brain surgery  03/2009    Stent placement  . Gallbladder surgery    . Mastectomy  2008  . Thyroid surgery      due to Goiter  . Cholecystectomy    . Total knee arthroplasty  2008    left  . Breast surgery  2009    lumpectomy  . Cardiac catheterization  07/24/2010    no CAD  . Colonscopy    . Permanent pacemaker insertion  03/27/2011    St.Jude    Family History  Problem Relation Age of Onset  . COPD Mother   . Cancer Father     lung, colon  . Cancer Maternal Aunt     breast  . Cancer Maternal Grandmother     breast    History   Social History  . Marital Status: Widowed    Spouse Name: N/A    Number of Children: N/A  . Years of Education: N/A   Occupational History  . Not on file.   Social History Main Topics  . Smoking status: Never Smoker   . Smokeless tobacco: Never Used  . Alcohol Use: No  . Drug Use: No  . Sexual Activity: No   Other Topics Concern  . Not on file   Social History Narrative  . No narrative on file    Review of systems: The patient specifically denies any chest pain at rest or with exertion, dyspnea at rest, orthopnea, paroxysmal nocturnal dyspnea, syncope, intermittent claudication, lower extremity edema, unexplained weight gain, cough, hemoptysis or wheezing.  The patient also denies abdominal pain, dysphagia, diarrhea, constipation, polyuria, polydipsia, dysuria, hematuria, frequency, urgency, abnormal bleeding or bruising, fever, chills, unexpected weight changes, mood swings, change in skin or hair texture, change in voice quality, auditory or visual problems, allergic reactions or rashes, new musculoskeletal complaints other than usual "aches and pains".   PHYSICAL EXAM BP 120/82  Pulse 76  Resp 16  Ht 5\' 6"   (1.676 m)  Wt 206 lb 3.2 oz (93.532 kg)  BMI 33.3 kg/m2 General: Alert, oriented x3, no distress  Head: no evidence of trauma, PERRL, EOMI, no exophtalmos or lid  lag, no myxedema, no xanthelasma; normal ears, nose and oropharynx  Neck: normal jugular venous pulsations and no hepatojugular reflux; brisk carotid pulses without delay and no carotid bruits  Chest: clear to auscultation, no signs of consolidation by percussion or palpation, normal fremitus, symmetrical and full respiratory excursions; healthy left subclavian pacemaker site  Cardiovascular: normal position and quality of the apical impulse, regular rhythm, normal first and second heart sounds, no murmurs, rubs or gallops  Abdomen: no tenderness or distention, no masses by palpation, no abnormal pulsatility or arterial bruits, normal bowel sounds, no hepatosplenomegaly  Extremities: no clubbing, cyanosis or edema; 2+ radial, ulnar and brachial pulses bilaterally; 2+ right femoral, posterior tibial and dorsalis pedis pulses; 2+ left femoral, posterior tibial and dorsalis pedis pulses; no subclavian or femoral bruits  Neurological: grossly nonfocal   EKG: Asensed Vpaced  ECHO 05/2011 - Left ventricle: The cavity size was normal. There was severe concentric hypertrophy. Systolic function was normal. The estimated ejection fraction was in the range of 55% to 60%. Doppler parameters are consistent with abnormal left ventricular relaxation (grade 1 diastolic dysfunction). - Ventricular septum: Septal motion showed abnormal function, dyssynergy, and paradox. These changes are consistent with right ventricular pacing. - Mitral valve: Calcified annulus. Mild to moderate regurgitation directed centrally. Diastolic regurgitation was present, consistent with long AV delay. - Left atrium: The atrium was mildly to moderately dilated.    Lipid Panel     Component Value Date/Time   CHOL 226* 05/21/2011 0615   TRIG 182* 05/21/2011 0615    HDL 38* 05/21/2011 0615   CHOLHDL 5.9 05/21/2011 0615   VLDL 36 05/21/2011 0615   LDLCALC 152* 05/21/2011 0615    BMET    Component Value Date/Time   NA 132* 12/16/2012 1027   NA 135* 11/28/2011 1025   K 4.2 12/16/2012 1027   K 4.6 11/28/2011 1025   CL 92* 12/16/2012 1027   CL 97* 11/28/2011 1025   CO2 29 12/16/2012 1027   CO2 27 11/28/2011 1025   GLUCOSE 183* 12/16/2012 1027   GLUCOSE 225* 11/28/2011 1025   BUN 16 12/16/2012 1027   BUN 19.0 11/28/2011 1025   CREATININE 0.92 12/16/2012 1027   CREATININE 0.89 07/29/2012 1626   CREATININE 1.1 11/28/2011 1025   CALCIUM 9.9 12/16/2012 1027   CALCIUM 10.0 11/28/2011 1025   GFRNONAA 61* 07/29/2012 1626   GFRAA 71* 07/29/2012 1626     ASSESSMENT AND PLAN Paroxysmal atrial fibrillation Asymptomatic, recorded by pacemaker in the past, none recently. Note that the atrial fibrillation occurred in the setting of severe metabolic decompensation due to hyperglycemia. This point antiplatelet therapy for intracranial arterial disease appears more important and warfarin anticoagulation. I think the risk of combined anticoagulant and antiplatelet therapy would be unexpectedly high in this elderly lady. Once there are no more issues with recurrent intracranial restenosis we will reconsider whether or not warfarin should be started depending on the prevalence of atrial fibrillation.  Pacemaker Dual chamber St[employer] Jude accent DR RF 2210 implanted January 2013 for sinus node dysfunction as well as AV node conduction disease.Normal device function.  104 mode switches(<1%)---max duration of approximately , Max A 179, Max V  94 No high ventricular rates noted.  Estimated longevity 8-9 years.  VIP was D/C: patient presented with 99% VP. Reprogrammed to physiological AV delays. Patient will follow up remotely on 03-19-2013, and with me in 3 months.   LBBB (left bundle branch block) Opportunity to upgrade to CRT-P in the future, if EF  deteriorates.  Exertional  dyspnea No clinical signs of hypervolemia. Weight has substantially decreased rather than increased as one would expect with CHF exacerbation. No sustained arrhythmia is seen. Overall, cardiac etiology of dyspnea appears to be doubtful. Check echo and CXR.   Orders Placed This Encounter  Procedures  . DG Chest 2 View  . Basic Metabolic Panel (BMET)  . B Nat Peptide  . Pacemaker Device Observation  . EKG 12-Lead  . 2D Echocardiogram with contrast   Meds ordered this encounter  Medications  . letrozole (FEMARA) 2.5 MG tablet    Sig: Take 2.5 mg by mouth daily.  . meclizine (ANTIVERT) 25 MG tablet    Sig: Take 25 mg by mouth 3 (three) times daily as needed.  . nitrofurantoin (MACRODANTIN) 100 MG capsule    Sig: Take 100 mg by mouth 2 (two) times daily.  . traMADol (ULTRAM) 50 MG tablet    Sig: Take 50 mg by mouth every 6 (six) hours as needed for pain.    Junious Silk, MD, [facility] [phone] office [phone] pager

## 2013-01-05 ENCOUNTER — Ambulatory Visit (HOSPITAL_COMMUNITY)
Admission: RE | Admit: 2013-01-05 | Discharge: 2013-01-05 | Disposition: A | Discharge status: Home / Self Care | Payer: Medicare Other | Source: Ambulatory Visit | Attending: Internal Medicine | Admitting: Internal Medicine

## 2013-01-05 DIAGNOSIS — R0602 Shortness of breath: Secondary | ICD-10-CM | POA: Diagnosis present

## 2013-01-05 NOTE — Progress Notes (Signed)
2D Echo Performed 01/05/2013    Yoana Staib, RCS  

## 2013-01-20 ENCOUNTER — Other Ambulatory Visit (INDEPENDENT_AMBULATORY_CARE_PROVIDER_SITE_OTHER)

## 2013-01-20 ENCOUNTER — Ambulatory Visit (INDEPENDENT_AMBULATORY_CARE_PROVIDER_SITE_OTHER): Payer: Medicare Other | Admitting: Internal Medicine

## 2013-01-20 ENCOUNTER — Encounter: Payer: Self-pay | Admitting: Internal Medicine

## 2013-01-20 ENCOUNTER — Ambulatory Visit (INDEPENDENT_AMBULATORY_CARE_PROVIDER_SITE_OTHER)
Admission: RE | Admit: 2013-01-20 | Discharge: 2013-01-20 | Disposition: A | Discharge status: Home / Self Care | Payer: Medicare Other | Source: Ambulatory Visit | Attending: Internal Medicine | Admitting: Internal Medicine

## 2013-01-20 VITALS — BP 122/76 | HR 66 | Temp 98.0°F | Ht 66.0 in | Wt 205.8 lb

## 2013-01-20 DIAGNOSIS — J90 Pleural effusion, not elsewhere classified: Secondary | ICD-10-CM

## 2013-01-20 DIAGNOSIS — I10 Essential (primary) hypertension: Secondary | ICD-10-CM

## 2013-01-20 DIAGNOSIS — R05 Cough: Secondary | ICD-10-CM

## 2013-01-20 DIAGNOSIS — Z853 Personal history of malignant neoplasm of breast: Secondary | ICD-10-CM

## 2013-01-20 DIAGNOSIS — J910 Malignant pleural effusion: Secondary | ICD-10-CM | POA: Insufficient documentation

## 2013-01-20 LAB — HEPATIC FUNCTION PANEL
ALT: 15 U/L (ref 0–35)
AST: 24 U/L (ref 0–37)
Alkaline Phosphatase: 52 U/L (ref 39–117)
Bilirubin, Direct: 0.1 mg/dL (ref 0.0–0.3)
Total Bilirubin: 0.8 mg/dL (ref 0.3–1.2)
Total Protein: 7.3 g/dL (ref 6.0–8.3)

## 2013-01-20 LAB — CBC WITH DIFFERENTIAL/PLATELET
Eosinophils Absolute: 0.1 10*3/uL (ref 0.0–0.7)
Eosinophils Relative: 1.7 % (ref 0.0–5.0)
HCT: 38.8 % (ref 36.0–46.0)
Lymphs Abs: 1.3 10*3/uL (ref 0.7–4.0)
MCHC: 34 g/dL (ref 30.0–36.0)
MCV: 92.1 fl (ref 78.0–100.0)
Monocytes Absolute: 0.6 10*3/uL (ref 0.1–1.0)
Neutrophils Relative %: 73.5 % (ref 43.0–77.0)
Platelets: 278 10*3/uL (ref 150.0–400.0)
WBC: 7.8 10*3/uL (ref 4.5–10.5)

## 2013-01-20 MED ORDER — VALSARTAN 160 MG PO TABS: 160.0000 mg | ORAL_TABLET | Freq: Every day | ORAL | Status: DC

## 2013-01-20 NOTE — Progress Notes (Signed)
  Subjective:    Patient ID: [name], female    DOB: 1935/01/10  MRN: [id number]  HPI  51 yowf with h/o only second hand smoke new onset cough 2013 then sob x 11/2012 referred 01/20/2013 to pulmonary clinic by Dr Doristine Counter for sob and cough    01/20/2013 [facility] office visit/ Plumer Mittelstaedt cc indolent onset cough x one year while on lisinopril and persisted since then  day > night  Min clear mucus does not wake her up. Sob x 2 months x across the room getting worse with neg cardiac w/u.   No obvious day to day or daytime variabilty or assoc chronic cough or cp or chest tightness, subjective wheeze overt sinus or hb symptoms. No unusual exp hx or h/o childhood pna/ asthma or knowledge of premature birth.  Sleeping ok without nocturnal  or early am exacerbation  of respiratory  c/o's or need for noct saba. Also denies any obvious fluctuation of symptoms with weather or environmental changes or other aggravating or alleviating factors except as outlined above   Current Medications, Allergies, Complete Past Medical History, Past Surgical History, Family History, and Social History were reviewed in [employer] record.         Review of Systems  Constitutional: Negative for fever, chills and unexpected weight change.  HENT: Positive for ear pain. Negative for congestion, dental problem, nosebleeds, postnasal drip, rhinorrhea, sinus pressure, sneezing, sore throat, trouble swallowing and voice change.   Eyes: Negative for visual disturbance.  Respiratory: Positive for cough and shortness of breath. Negative for choking.   Cardiovascular: Negative for chest pain and leg swelling.  Gastrointestinal: Negative for vomiting, abdominal pain and diarrhea.  Genitourinary: Negative for difficulty urinating.       Indigestion  Musculoskeletal: Negative for arthralgias.  Skin: Negative for rash.  Neurological: Negative for tremors, syncope and headaches.  Hematological:  Does not bruise/bleed easily.       Objective:   Physical Exam  amb hoarse wf nad  Wt Readings from Last 3 Encounters:  01/20/13 205 lb 12.8 oz (93.35 kg)  12/16/12 206 lb 3.2 oz (93.532 kg)  11/21/12 212 lb 12.8 oz (96.525 kg)     HEENT: nl dentition, turbinates, and orophanx. Nl external ear canals without cough reflex   NECK :  without JVD/Nodes/TM/ nl carotid upstrokes bilaterally   LUNGS: no acc muscle use, clear to A and P bilaterally without cough on insp or exp maneuvers   CV:  RRR  no s3 or murmur or increase in P2, no edema   ABD:  soft and nontender ? Ascites  with nl excursion in the supine position. No bruits or organomegaly, bowel sounds nl  MS:  warm without deformities, calf tenderness, cyanosis or clubbing  SKIN: warm and dry without lesions    NEURO:  alert, approp, no deficits     CXR  01/20/2013 :  Increasing R effusion  Labs ok x LFT"s up with SGOT >> PT     Assessment & Plan:

## 2013-01-20 NOTE — Patient Instructions (Signed)
Stop lisinopril Start Diovan 160 mg one tablet in place of lisinopril Stop fish oil   Please remember to go to the lab and x-ray department downstairs for your tests - we will call you with the results when they are available.  Please schedule a follow up office visit in 4 weeks, sooner if needed

## 2013-01-21 ENCOUNTER — Other Ambulatory Visit: Payer: Self-pay | Admitting: Internal Medicine

## 2013-01-21 ENCOUNTER — Telehealth: Payer: Self-pay | Admitting: Internal Medicine

## 2013-01-21 DIAGNOSIS — R05 Cough: Secondary | ICD-10-CM | POA: Insufficient documentation

## 2013-01-21 DIAGNOSIS — R945 Abnormal results of liver function studies: Secondary | ICD-10-CM

## 2013-01-21 DIAGNOSIS — R1900 Intra-abdominal and pelvic swelling, mass and lump, unspecified site: Secondary | ICD-10-CM

## 2013-01-21 DIAGNOSIS — J90 Pleural effusion, not elsewhere classified: Secondary | ICD-10-CM

## 2013-01-21 DIAGNOSIS — I10 Essential (primary) hypertension: Secondary | ICD-10-CM | POA: Insufficient documentation

## 2013-01-21 MED ORDER — VALSARTAN 160 MG PO TABS: 160.0000 mg | ORAL_TABLET | Freq: Every day | ORAL | Status: DC

## 2013-01-21 NOTE — Telephone Encounter (Signed)
Result Notes    Notes Recorded by Christen Butter, CMA on 01/21/2013 at 9:40 AM Orders were sent to [facility] for the pt ------  Notes Recorded by Nyoka Cowden, MD on 01/20/2013 at 7:11 PM Call patient : Study is c/w increasing R fluid so needs  A) abd u/s at [facility]  B) thoracentesis same day our service for glucose, prot, LDH, cell count and diff, cytology, Triglyerides, Creatinine   ----------------- Called, spoke with pt. Explained above results and recs to her.  She now verbalized understanding and understands why procedures were ordered.   Diovan rx was sent to local pharm during OV yesterday.  Pt is requesting rx to be sent to [facility] because it will be cheaper for her.  Rx was sent to [facility].  Pt aware and voiced no further questions or concerns at this time.

## 2013-01-21 NOTE — Assessment & Plan Note (Signed)

## 2013-01-21 NOTE — Progress Notes (Signed)
Quick Note:  Orders were sent to [facility] for the pt ______

## 2013-01-21 NOTE — Assessment & Plan Note (Signed)
In absence of a cardiac source, most likely this is related to liver dz so needs thoracentesis and start with abd u/s ? Could this be a metastatic process as not typical of hepatitis and she's not a drinker  See instructions for specific recommendations which were reviewed directly with the patient who was given a copy with highlighter outlining the key components.

## 2013-01-21 NOTE — Assessment & Plan Note (Signed)
Not clear whether this is related to pl effusion or  Classic Upper airway cough syndrome, so named because it's frequently impossible to sort out how much is  CR/sinusitis with freq throat clearing (which can be related to primary GERD)   vs  causing  secondary (" extra esophageal")  GERD from wide swings in gastric pressure that occur with throat clearing, often  promoting self use of mint and menthol lozenges that reduce the lower esophageal sphincter tone and exacerbate the problem further in a cyclical fashion.   These are the same pts (now being labeled as having "irritable larynx syndrome" by some cough centers) who not infrequently have a history of having failed to tolerate ace inhibitors,  dry powder inhalers or biphosphonates or report having atypical reflux symptoms that don't respond to standard doses of PPI , and are easily confused as having aecopd or asthma flares by even experienced allergists/ pulmonologists.  Try off acei while awaiting results of w/u

## 2013-01-22 ENCOUNTER — Ambulatory Visit (HOSPITAL_COMMUNITY)
Admission: RE | Admit: 2013-01-22 | Discharge: 2013-01-22 | Disposition: A | Discharge status: Home / Self Care | Payer: Medicare Other | Source: Ambulatory Visit | Attending: Internal Medicine | Admitting: Internal Medicine

## 2013-01-22 ENCOUNTER — Ambulatory Visit (HOSPITAL_COMMUNITY)
Admission: RE | Admit: 2013-01-22 | Discharge: 2013-01-22 | Disposition: A | Discharge status: Home / Self Care | Payer: Medicare Other | Source: Ambulatory Visit | Attending: Radiology | Admitting: Radiology

## 2013-01-22 ENCOUNTER — Encounter: Payer: Self-pay | Admitting: Internal Medicine

## 2013-01-22 ENCOUNTER — Other Ambulatory Visit: Payer: Self-pay | Admitting: Internal Medicine

## 2013-01-22 DIAGNOSIS — R1900 Intra-abdominal and pelvic swelling, mass and lump, unspecified site: Secondary | ICD-10-CM | POA: Insufficient documentation

## 2013-01-22 DIAGNOSIS — K838 Other specified diseases of biliary tract: Secondary | ICD-10-CM | POA: Insufficient documentation

## 2013-01-22 DIAGNOSIS — Z853 Personal history of malignant neoplasm of breast: Secondary | ICD-10-CM | POA: Insufficient documentation

## 2013-01-22 DIAGNOSIS — I10 Essential (primary) hypertension: Secondary | ICD-10-CM | POA: Insufficient documentation

## 2013-01-22 DIAGNOSIS — J90 Pleural effusion, not elsewhere classified: Secondary | ICD-10-CM

## 2013-01-22 DIAGNOSIS — Z9089 Acquired absence of other organs: Secondary | ICD-10-CM | POA: Insufficient documentation

## 2013-01-22 DIAGNOSIS — J910 Malignant pleural effusion: Secondary | ICD-10-CM | POA: Insufficient documentation

## 2013-01-22 DIAGNOSIS — R945 Abnormal results of liver function studies: Secondary | ICD-10-CM | POA: Insufficient documentation

## 2013-01-22 DIAGNOSIS — R7989 Other specified abnormal findings of blood chemistry: Secondary | ICD-10-CM | POA: Insufficient documentation

## 2013-01-22 DIAGNOSIS — E119 Type 2 diabetes mellitus without complications: Secondary | ICD-10-CM | POA: Insufficient documentation

## 2013-01-22 LAB — BODY FLUID CELL COUNT WITH DIFFERENTIAL
Eos, Fluid: 0 %
Lymphs, Fluid: 84 %
Monocyte-Macrophage-Serous Fluid: 15 % — ABNORMAL LOW (ref 50–90)
Neutrophil Count, Fluid: 1 % (ref 0–25)
Total Nucleated Cell Count, Fluid: 814 cu mm (ref 0–1000)

## 2013-01-22 LAB — PROTEIN, BODY FLUID: Total protein, fluid: 4.7 g/dL

## 2013-01-22 LAB — GLUCOSE, SEROUS FLUID

## 2013-01-22 NOTE — Procedures (Signed)
Successful US guided right thoracentesis. Yielded 1.5L of clear yellow fluid. Pt tolerated procedure well. No immediate complications.  Specimen was sent for labs. CXR ordered.  [name] PA-C 01/22/2013 10:14 AM

## 2013-01-22 NOTE — Progress Notes (Signed)
Quick Note:  Spoke with pt and notified of results per Dr. Wert. Pt verbalized understanding and denied any questions.  ______ 

## 2013-01-23 ENCOUNTER — Encounter: Payer: Self-pay | Admitting: Physician Assistant

## 2013-01-23 ENCOUNTER — Telehealth: Payer: Self-pay | Admitting: Internal Medicine

## 2013-01-23 NOTE — Telephone Encounter (Signed)
atc na x1 

## 2013-01-26 ENCOUNTER — Encounter: Payer: Self-pay | Admitting: Internal Medicine

## 2013-01-26 ENCOUNTER — Ambulatory Visit: Payer: Medicare Other | Admitting: Physician Assistant

## 2013-01-26 ENCOUNTER — Other Ambulatory Visit: Payer: Self-pay | Admitting: Internal Medicine

## 2013-01-26 NOTE — Progress Notes (Signed)
Quick Note:  Spoke with the pt and verified that she is to stop taking diovan  I have scheduled appt with MW for 01/28/13 at 4:30 for BP recheck and referral to T surgery ______

## 2013-01-26 NOTE — Telephone Encounter (Signed)
ATC NAx1 on cell number, no voicemail. [id number] on home number. Carron Curie, CMA

## 2013-01-26 NOTE — Telephone Encounter (Signed)
Spoke with the pt  She states that she is already set for GI appt 11/26 and nothing further needed

## 2013-01-26 NOTE — Telephone Encounter (Signed)
Pt returned triage's call.  Can be reached at [phone].  Antionette Fairy

## 2013-01-28 ENCOUNTER — Encounter: Payer: Self-pay | Admitting: Internal Medicine

## 2013-01-28 ENCOUNTER — Ambulatory Visit (INDEPENDENT_AMBULATORY_CARE_PROVIDER_SITE_OTHER): Admitting: Internal Medicine

## 2013-01-28 ENCOUNTER — Other Ambulatory Visit (INDEPENDENT_AMBULATORY_CARE_PROVIDER_SITE_OTHER)

## 2013-01-28 VITALS — BP 120/62 | HR 63 | Temp 98.1°F | Ht 66.0 in | Wt 203.0 lb

## 2013-01-28 DIAGNOSIS — R945 Abnormal results of liver function studies: Secondary | ICD-10-CM

## 2013-01-28 DIAGNOSIS — R7989 Other specified abnormal findings of blood chemistry: Secondary | ICD-10-CM

## 2013-01-28 DIAGNOSIS — J90 Pleural effusion, not elsewhere classified: Secondary | ICD-10-CM

## 2013-01-28 DIAGNOSIS — I10 Essential (primary) hypertension: Secondary | ICD-10-CM

## 2013-01-28 MED ORDER — GABAPENTIN 300 MG PO CAPS: ORAL_CAPSULE | ORAL | Status: DC

## 2013-01-28 NOTE — Progress Notes (Signed)
Subjective:    Patient ID: [name], female    DOB: 28-Jan-1935  MRN: [id number]    Brief patient profile:  38 yowf with h/o only second hand smoke new onset cough 2013 then sob x 11/2012 referred 01/20/2013 to pulmonary clinic by Dr Doristine Counter for sob and cough.    History of Present Illness  Breast Ca June 2008   T2 N2 pos nodes > RT / chemo completed 2009   01/20/2013 1st [facility] office visit/ [name] cc indolent onset cough x one year while on lisinopril and persisted since then  day > night  Min clear mucus does not wake her up. Sob x 2 months x across the room getting worse with neg cardiac w/u.    - Thoracentesis requested 01/21/2013 > 1.5 liters, 84% Lymphs, exudate by prot 4.7/7.2 >  ADENOCA >  Be be referred 01/28/13  for pleurodesis.    01/28/2013 f/u ov/[name] re: malignant R effusion/ hbp off acei Chief Complaint  Patient presents with  . Follow-up    Pt reports breathing is better. She c/o feeling dizzy since thoracentesis done 01/22/13.    diovan on hold, poor po intake. No syncope.      No obvious day to day or daytime variabilty or assoc chronic cough or cp or chest tightness, subjective wheeze overt sinus or hb symptoms. No unusual exp hx or h/o childhood pna/ asthma or knowledge of premature birth.  Sleeping ok without nocturnal  or early am exacerbation  of respiratory  c/o's or need for noct saba. Also denies any obvious fluctuation of symptoms with weather or environmental changes or other aggravating or alleviating factors except as outlined above   Current Medications, Allergies, Complete Past Medical History, Past Surgical History, Family History, and Social History were reviewed in [employer] record.  ROS  The following are not active complaints unless bolded sore throat, dysphagia, dental problems, itching, sneezing,  nasal congestion or excess/ purulent secretions, ear ache,   fever, chills, sweats, unintended wt loss,  pleuritic or exertional cp, hemoptysis,  orthopnea pnd or leg swelling, presyncope, palpitations, heartburn, abdominal pain, anorexia, nausea, vomiting, diarrhea  or change in bowel or urinary habits, change in stools or urine, dysuria,hematuria,  rash, arthralgias, visual complaints, headache, numbness weakness or ataxia or problems with walking or coordination,  change in mood/affect or memory.                      Objective:   Physical Exam  amb  wf nad looks chronically ill,  Voice tremulous   01/28/13         203 Wt Readings from Last 3 Encounters:  01/20/13 205 lb 12.8 oz (93.35 kg)  12/16/12 206 lb 3.2 oz (93.532 kg)  11/21/12 212 lb 12.8 oz (96.525 kg)     HEENT: nl dentition, turbinates, and orophanx. Nl external ear canals without cough reflex   NECK :  without JVD/Nodes/TM/ nl carotid upstrokes bilaterally   LUNGS: no acc muscle use, decreased bs R base with dullness   CV:  RRR  no s3 or murmur or increase in P2, no edema   ABD:  soft and nontender    with nl excursion in the supine position. No bruits or organomegaly, bowel sounds nl  MS:  warm without deformities, calf tenderness, cyanosis or clubbing  SKIN: warm and dry without lesions    NEURO:  alert, approp, no deficits, mild resting tremor bilat UE  CXR  01/20/2013 :  Increasing R effusion       Assessment & Plan:

## 2013-01-28 NOTE — Patient Instructions (Addendum)
Please see patient coordinator before you leave today  to schedule a thoracic surgery evaluation and cancel the GI appt  Please remember to go to the lab department downstairs for your tests - we will call you with the results when they are available.  Hold diovan for now - once weakness improves ok to start back on one half

## 2013-01-29 LAB — BASIC METABOLIC PANEL
CO2: 31 mEq/L (ref 19–32)
Chloride: 99 mEq/L (ref 96–112)
Creatinine, Ser: 1.1 mg/dL (ref 0.4–1.2)
GFR: 53.48 mL/min — ABNORMAL LOW (ref >60.00)
Glucose, Bld: 111 mg/dL — ABNORMAL HIGH (ref 70–99)
Sodium: 136 mEq/L (ref 135–145)

## 2013-01-29 NOTE — Progress Notes (Signed)
Quick Note:  Spoke with pt and notified of results per Dr. Wert. Pt verbalized understanding and denied any questions.  ______ 

## 2013-01-30 ENCOUNTER — Encounter: Payer: Self-pay | Admitting: Internal Medicine

## 2013-01-30 ENCOUNTER — Other Ambulatory Visit: Payer: Self-pay | Admitting: *Deleted

## 2013-01-30 DIAGNOSIS — J90 Pleural effusion, not elsewhere classified: Secondary | ICD-10-CM

## 2013-01-30 NOTE — Assessment & Plan Note (Signed)
-   Thoracentesis  01/22/13 > 1.5 liters, 84% Lymphs, exudate by prot 4.7/7.2 >  ADENOCA >  referred 01/28/13  for pleurodesis  Pathology not clear as to source but I strongly suspect breast ca in this never smoker   Needs vats for pleural bx and pleurodesis> Discussed in detail all the  indications, usual  risks and alternatives  relative to the benefits with patient who agrees to proceed  c referral to T Surgery

## 2013-01-30 NOTE — Assessment & Plan Note (Signed)
Trial off acei 01/21/2013 due to cough - hold diovan 01/28/13 due to low bp, mild pre renal azotemia  She is on inderal anyway so ok to hold diovan until feeling much better and po intake back to nl

## 2013-01-30 NOTE — Assessment & Plan Note (Signed)
abd u/s 01/22/2013 > ? Cirrhosis ?  But no ascites   LFT's now nl but ? Whether there is cirrhosis or other liver dz (met ca?) > moot issue for now but will need complete staging w/u once we solve the immediate problem

## 2013-02-02 ENCOUNTER — Encounter: Payer: Medicare Other | Admitting: Cardiothoracic Surgery

## 2013-02-02 ENCOUNTER — Encounter: Payer: Self-pay | Admitting: Cardiothoracic Surgery

## 2013-02-02 ENCOUNTER — Other Ambulatory Visit: Payer: Self-pay | Admitting: *Deleted

## 2013-02-02 ENCOUNTER — Encounter (HOSPITAL_COMMUNITY): Payer: Self-pay | Admitting: *Deleted

## 2013-02-02 ENCOUNTER — Ambulatory Visit
Admission: RE | Admit: 2013-02-02 | Discharge: 2013-02-02 | Disposition: A | Discharge status: Home / Self Care | Payer: Medicare Other | Source: Ambulatory Visit | Attending: Cardiothoracic Surgery | Admitting: Cardiothoracic Surgery

## 2013-02-02 ENCOUNTER — Institutional Professional Consult (permissible substitution) (INDEPENDENT_AMBULATORY_CARE_PROVIDER_SITE_OTHER): Admitting: Cardiothoracic Surgery

## 2013-02-02 VITALS — BP 155/74 | HR 64 | Resp 20 | Ht 66.0 in | Wt 206.0 lb

## 2013-02-02 DIAGNOSIS — J90 Pleural effusion, not elsewhere classified: Secondary | ICD-10-CM

## 2013-02-02 DIAGNOSIS — Z853 Personal history of malignant neoplasm of breast: Secondary | ICD-10-CM

## 2013-02-02 DIAGNOSIS — J910 Malignant pleural effusion: Secondary | ICD-10-CM

## 2013-02-02 MED ORDER — DEXTROSE 5 % IV SOLN
1.5000 g | INTRAVENOUS | Status: AC
  Administered 2013-02-03: 1.5 g via INTRAVENOUS
  Filled 2013-02-02: qty 1.5

## 2013-02-02 NOTE — Progress Notes (Signed)
02/02/13 1853  OBSTRUCTIVE SLEEP APNEA  Have you ever been diagnosed with sleep apnea through a sleep study? No  Do you snore loudly (loud enough to be heard through closed doors)?  1  Do you often feel tired, fatigued, or sleepy during the daytime? 1  Has anyone observed you stop breathing during your sleep? 0  Do you have, or are you being treated for high blood pressure? 1  BMI more than 35 kg/m2? 0  Age over 76 years old? 1  Gender: 0  Obstructive Sleep Apnea Score 4  Score 4 or greater  Results sent to PCP

## 2013-02-02 NOTE — Progress Notes (Signed)
02/02/13 1844  OBSTRUCTIVE SLEEP APNEA  Has anyone observed you stop breathing during your sleep? 0  Do you have, or are you being treated for high blood pressure? 1  BMI more than 35 kg/m2? 0  Age over 76 years old? 1  Gender: 0  Obstructive Sleep Apnea Score 2  Score 4 or greater  Results sent to PCP

## 2013-02-02 NOTE — Progress Notes (Signed)
[address]       [place] [address]             [phone]                    RELDA AGOSTO [facility] Medical Record #[id number] Date of Birth: 18-Mar-1934  Referring: [name], MD Primary Care: [name], MD  Chief Complaint:    Chief Complaint  Patient presents with  . Malignant Pleural Effusion    right...h/o right breast cancer 2008...cxr today...has had one thoracenteisi on 01/22/13    History of Present Illness:    [name] 78 y.o. female is seen in the office  today for recurrent right pleural effusion. Patient has a history of  TNM : pT2, pN2a, pMX Estrogen receptor Positive (38%) Progesterone receptor Positive (10%) Ki 67 (Mib-1) 41% Her 2 neu (HercepTest) Negative (1.5)    She underwent preoperative chemotherapy, mastectomy12/10/2006 in followup radiation therapy.  She recently saw Dr. Sherene Sires for 2-3 months of increasing shortness of breath. A thoracentesis was done on the right and confirmed metastatic adenocarcinoma. Patient is referred to thoracic surgery.     Current Activity/ Functional Status:  Patient is independent with mobility/ambulation, transfers, ADL's, IADL's.  Zubrod Score: At the time of surgery this patient's most appropriate activity status/level should be described as: []  Normal activity, no symptoms []  Symptoms, fully ambulatory [x]  Symptoms, in bed less than or equal to 50% of the time []  Symptoms, in bed greater than 50% of the time but less than 100% []  Bedridden []  Moribund   Past Medical History  Diagnosis Date  . Goiter   . Arthritis   . Dizziness - light-headed   . Breast cancer   . Menopause   . Slow heart rate   . Hyperlipidemia   . PONV (postoperative nausea and vomiting)   . Hypertension     dr croitoru   SE  . Sinus node dysfunction 03/27/2011    St[employer]Jude pacemaker  . Stroke     TIA per patient history form dated 02/15/10.  . Diabetes mellitus     Type 2  . Cancer    rt. breast ca  . Family history of anesthesia complication     Daughter Nausea/Vomitting  . Depression   . Familial tremor     takes Inderal  (tremor of head)  . Hyponatremia   . Rectal bleeding   . Atrial tachycardia   . History of breast cancer, right, T3, N1, mastectomy 02/27/2007. 03/23/2011    F/u By Ezzard Standing - sp RT to Rt and chemo     Past Surgical History  Procedure Laterality Date  . Joint replacement      Knee - ask patient to clarify which knee or both.  . Brain surgery  03/2009    Stent placement  . Gallbladder surgery    . Mastectomy  2008  . Thyroid surgery      due to Goiter  . Cholecystectomy    . Total knee arthroplasty  2008    left  . Breast surgery  2009    lumpectomy  . Cardiac catheterization  07/24/2010    no CAD  . Colonscopy    . Permanent pacemaker insertion  03/27/2011    St.Jude    Family History  Problem Relation Age of Onset  . COPD Mother     was a smoker  . Cancer Father  lung, colon-was a smoker  . Cancer Maternal Aunt     breast  . Cancer Maternal Grandmother     breast  . Rheum arthritis Mother     History   Social History  . Marital Status: Widowed    Spouse Name: N/A    Number of Children: N/A  . Years of Education: N/A   Occupational History  . Book Keeper    Social History Main Topics  . Smoking status: Never Smoker   . Smokeless tobacco: Never Used     Comment: lived with a smoker for most of her life  . Alcohol Use: No  . Drug Use: No  . Sexual Activity: No   Other Topics Concern  . Not on file   Social History Narrative  . No narrative on file    History  Smoking status  . Never Smoker   Smokeless tobacco  . Never Used    Comment: lived with a smoker for most of her life    History  Alcohol Use No     Allergies  Allergen Reactions  . Oxycodone Hcl Nausea And Vomiting  . Psyllium   . Zofran [Ondansetron Hcl] Swelling  . Crestor [Rosuvastatin Calcium] Other (See Comments)    dizziness  .  Statins Other (See Comments)    Cramping in legs  . Zetia [Ezetimibe] Other (See Comments)    dizziness    Current Outpatient Prescriptions  Medication Sig Dispense Refill  . aspirin 81 MG EC tablet Take 81 mg by mouth every morning.      . Cholecalciferol (VITAMIN D) 2000 UNITS tablet Take 2,000 Units by mouth every morning.       . clopidogrel (PLAVIX) 75 MG tablet Take 75 mg by mouth daily.      . Cyanocobalamin (VITAMIN B-12) 5000 MCG SUBL Place 5,000 mcg under the tongue daily.      Marland Kitchen escitalopram (LEXAPRO) 10 MG tablet Take 10 mg by mouth daily.       Marland Kitchen glipiZIDE (GLUCOTROL XL) 10 MG 24 hr tablet Take 10 mg by mouth daily with breakfast.       . Magnesium 500 MG TABS Take 500 mg by mouth every evening.      . meclizine (ANTIVERT) 25 MG tablet Take 25 mg by mouth 3 (three) times daily as needed.      . metFORMIN (GLUCOPHAGE) 500 MG tablet Take 500 mg by mouth 2 (two) times daily.       . Multiple Vitamin (MULTIVITAMIN WITH MINERALS) TABS Take 1 tablet by mouth every morning.      . promethazine (PHENERGAN) 25 MG tablet Take 1 tablet (25 mg total) by mouth every 6 (six) hours as needed for nausea.  30 tablet  0  . propranolol (INDERAL) 40 MG tablet Take 40 mg by mouth 2 (two) times daily.       . traMADol (ULTRAM) 50 MG tablet Take 50 mg by mouth every 6 (six) hours as needed for pain.      . [DISCONTINUED] insulin glargine (LANTUS SOLOSTAR) 100 UNIT/ML injection Inject 5 Units into the skin daily as needed. For blood sugar over 120       No current facility-administered medications for this visit.    Review of Systems:     Cardiac Review of Systems: Y or N  Chest Pain [  n  ]  Resting SOB [ y  ] Exertional SOB  Cove.Etienne  ]  Orthopnea [ y ]  Pedal Edema [ n  ]    Palpitations [ n ] Syncope  [n ]   Presyncope [ n  ]  General Review of Systems: [Y] = yes [  ]=no Constitional: recent weight change [  ];  Wt loss over the last 3 months [ n  ] anorexia [  ]; fatigue [ y ]; nausea [ n ];  night sweats [  ]; fever [  ]; or chills [  ];          Dental: poor dentition[  ]; Last Dentist visit:   Eye : blurred vision [  ]; diplopia [   ]; vision changes [  ];  Amaurosis fugax[  ]; Resp: cough [  ];  wheezing[  ];  hemoptysis[  ]; shortness of breath[  ]; paroxysmal nocturnal dyspnea[  ]; dyspnea on exertion[  ]; or orthopnea[  ];  GI:  gallstones[  ], vomiting[  ];  dysphagia[  ]; melena[  ];  hematochezia [  ]; heartburn[  ];   Hx of  Colonoscopy[  ]; GU: kidney stones [  ]; hematuria[  ];   dysuria [  ];  nocturia[  ];  history of     obstruction [  ]; urinary frequency [  ]             Skin: rash, swelling[  ];, hair loss[  ];  peripheral edema[  ];  or itching[  ]; Musculosketetal: myalgias[  ];  joint swelling[  ];  joint erythema[  ];  joint pain[  ];  back pain[  ];  Heme/Lymph: bruising[  ];  bleeding[  ];  anemia[  ];  Neuro: TIA[  ];  headaches[  ];  Stroke[yx2   ];  vertigo[  ];  seizures[  ];   paresthesias[  ];  difficulty walking[  ];  Psych:depression[  ]; anxiety[  ];  Endocrine: diabetes[  ];  thyroid dysfunction[  ];  Immunizations: Flu up to date [ y ]; Pneumococcal up to date [ y ];  Other:  Physical Exam: BP 155/74  Pulse 64  Resp 20  Ht 5\' 6"  (1.676 m)  Wt 206 lb (93.441 kg)  BMI 33.27 kg/m2  SpO2 95%    General appearance: alert, cooperative and appears stated age Neurologic: intact Heart: regular rate and rhythm, S1, S2 normal, no murmur, click, rub or gallop Lungs: diminished breath sounds RLL and RML Abdomen: soft, non-tender; bowel sounds normal; no masses,  no organomegaly Extremities: extremities normal, atraumatic, no cyanosis or edema and Homans sign is negative, no sign of DVT Patient has healed right mastectomy scar, there are no palpable nodules or axillary adenopathy appreciated she did not have any arm swelling.  Diagnostic Studies & Laboratory data:     Recent Radiology Findings:  Dg Chest 2 View  02/02/2013   CLINICAL DATA:   Shortness of breath  EXAM: CHEST  2 VIEW  COMPARISON:  01/22/2013  FINDINGS: Increasing right-sided pleural effusion is noted when compare with the prior exam. Postsurgical changes in the right axilla are seen. Prior right mastectomy is noted. A pacing device is again seen and stable. The cardiac shadow is unchanged. No acute bony abnormality is seen. Chronic compression deformity is noted at the thoracolumbar junction.  IMPRESSION: Increasing right-sided pleural effusion.   Electronically Signed   By: Alcide Clever M.D.   On: 02/02/2013 16:54    Dg Chest 1 View  01/22/2013   CLINICAL DATA:  Post right thoracentesis.  EXAM: CHEST - 1 VIEW  COMPARISON:  01/20/2013  FINDINGS: Decreasing right pleural effusion. No pneumothorax. Right base atelectasis. No confluent opacity on the left. Left pacer is unchanged. Heart is mildly enlarged.  IMPRESSION: Decreasing right effusion following thoracentesis. No pneumothorax. Right base atelectasis.   Electronically Signed   By: Charlett Nose M.D.   On: 01/22/2013 10:31   Dg Chest 2 View  01/20/2013   CLINICAL DATA:  Cough and shortness of breath  EXAM: CHEST  2 VIEW  COMPARISON:  12/16/2012  FINDINGS: Cardiac shadow is stable. A pacing device is again seen. An\ increasing right-sided pleural effusion is noted. Chronic compression deformities are noted in the thoracic spine.  IMPRESSION: Increasing right-sided pleural effusion.   Electronically Signed   By: Alcide Clever M.D.   On: 01/20/2013 13:14   US Abdomen Complete  01/22/2013   CLINICAL DATA:  History of abdominal swelling and abnormal liver function tests. History of prior cholecystectomy, hypertension, diabetes, and breast carcinoma. History of bile duct dilatation.  EXAM: ULTRASOUND ABDOMEN COMPLETE  COMPARISON:  CT 08/25/2010.  Ultrasound 08/24/2010.  FINDINGS: Gallbladder  There is history of prior cholecystectomy. No gallbladder is evident.  Common bile duct  Diameter: 16 mm. This is slightly dilated post  cholecystectomy. On previous study the common bile duct measures 17 mm. There is again slight biliary dilatation. On some images there are few internal echoes within the common bile duct. These may reflect or debris. There is a 5 mm echogenic focus which may reflect a small calculus within the common bile duct.  Liver  No focal lesion identified. Within normal limits in parenchymal echogenicity. Portal vein is patent with hepatopetal flow. There is slight nodularity of the surface of the liver. This appearance is may be associated with cirrhosis.  IVC  No abnormality visualized.  Pancreas  Visualized portion unremarkable. Portions of the head and tail are obscured by bowel gas.  Spleen  Size and appearance within normal limits.  Splenic length is 7.5 cm.  Right Kidney  Length: Right renal length is 11.0 cm. Echogenicity within normal limits. No mass or hydronephrosis visualized.  Left Kidney  Length: Left renal length is 11.1 cm. Echogenicity within normal limits. No mass or hydronephrosis visualized.  Abdominal aorta  No aneurysm visualized. Maximum diameter is 2.2 cm. Distal portion was obscured by bowel gas.  IMPRESSION: Post cholecystectomy. Common bile duct measures 16 mm. This is slightly dilated post cholecystectomy. On previous study the common bile duct measures 17 mm. There is again slight biliary dilatation. . On some images there are few internal echoes within the common bile duct. These may reflect debris. There is a 5 mm echogenic focus which may reflect a small calculus within the common bile duct.There is slight nodularity of the surface of the liver. This appearance may be associated with cirrhosis.   Electronically Signed   By: Onalee Hua  Call M.D.   On: 01/22/2013 11:07   US Thoracentesis Asp Pleural Space W/img Guide  01/22/2013   CLINICAL DATA:  Shortness of breath, right-sided pleural effusion. Request diagnostic and therapeutic thoracentesis  EXAM: ULTRASOUND GUIDED RIGHT THORACENTESIS   COMPARISON:  None.  FINDINGS: A total of approximately 1.5 L of clear yellow fluid was removed. A fluid sample wassent for laboratory analysis.  IMPRESSION: Successful ultrasound guided right thoracentesis yielding 1.5 L of pleural fluid.  Read by: [name] PA-C  PROCEDURE: An ultrasound guided thoracentesis was thoroughly discussed with the patient and questions  answered. The benefits, risks, alternatives and complications were also discussed. The patient understands and wishes to proceed with the procedure. Written consent was obtained.  Ultrasound was performed to localize and mark an adequate pocket of fluid in the right chest. The area was then prepped and draped in the normal sterile fashion. 1% Lidocaine was used for local anesthesia. Under ultrasound guidance a 19 gauge Yueh catheter was introduced. Thoracentesis was performed. The catheter was removed and a dressing applied.  Complications:  None immediate   Electronically Signed   By: Malachy Moan M.D.   On: 01/22/2013 12:26    PATH:PLEURAL FLUID, RIGHT (SPECIMEN 1 OF 1 COLLECTED 01/22/13): METASTATIC ADENOCARCINOMA, SEE COMMENT. Valinda Hoar MD Pathologist, Electronic Signature (Case signed 01/26/2013) Specimen Clinical Information hx of breast carcinoma Source Pleural Fluid, Right, (specimen 1 of 1 collected 01/22/13) Comment There are scattered large atypical cells, as well as, rare small glands on the cell block material. Immunohistochemistry reveal additional small glands and scattered cells that are positive for MOC31 and negative for calretinin. Calretinin highlights background mesothelial cells. These findings are consistent with metastatic adenocarcinoma. The patient's history of breast carcinoma is noted. Dr. Sherene Sires was notified 01/26/2013.  Path at time of resection: INVASIVE DUCTAL CARCINOMA, HIGH GRADE, 5.0 CM TNM Code: pT2, pN2a, pMX Breast Prognostic Markers: Case number [id number] Estrogen receptor Positive  (38%) Progesterone receptor Positive (10%) Ki 67 (Mib-1) 41% Her 2 neu (HercepTest) Negative (1.5) Her 2 neu by [facility] Not performed     Recent Lab Findings: Lab Results  Component Value Date   WBC 7.8 01/20/2013   HGB 13.2 01/20/2013   HCT 38.8 01/20/2013   PLT 278.0 01/20/2013   GLUCOSE 111* 01/28/2013   CHOL 226* 05/21/2011   TRIG 182* 05/21/2011   HDL 38* 05/21/2011   LDLCALC 152* 05/21/2011   ALT 15 01/20/2013   AST 24 01/20/2013   NA 136 01/28/2013   K 5.1 01/28/2013   CL 99 01/28/2013   CREATININE 1.1 01/28/2013   BUN 20 01/28/2013   CO2 31 01/28/2013   TSH 1.17 01/20/2013   INR 1.09 10/22/2011   HGBA1C 6.9* 05/21/2011      Assessment / Plan:   Right malignant pleural effusion, fairly rapidly increasing after thoracentesis in a patient with previous history of  pT2, pN2a, pMX invasive ductal carcinoma treated with preoperative radiation surgical resection and postop radiation in 2008.  With known malignant pleural effusion which appears to be fairly rapidly reaccumulating up recommended to the patient that we proceed very quickly with placement of Pleurx catheter to control the right malignant pleural effusion before she ends up in respiratory distress or admitted through the emergency. She is agreeable with this approach. The risks and options have been discussed with her and her daughter in detail. We'll plan to place a right Pleurx catheter tomorrow. Post procedure she will need early followup with oncology to direct ultimate treatment of her underlying malignancy.2    Goiter;  Arthritis;  Dizziness - light-headed;  Hyperlipidemia;  PONV (postoperative nausea and vomiting);  Hypertension;  Sinus node dysfunction (03/27/2011);  Stroke;  Diabetes mellitus;  Cancer;  Family history of anesthesia complication;  Depression; Familial tremor;  Hyponatremia;  Rectal bleeding;  Atrial tachycardia;          I spent 40 minutes counseling the patient face to  face. The total time spent in the appointment was 60 minutes.  Delight Ovens MD      [address] [place] [place]  [address] Office [phone]   Beeper [phone]  02/02/2013 5:34 PM

## 2013-02-03 ENCOUNTER — Ambulatory Visit (HOSPITAL_COMMUNITY): Payer: Medicare Other | Admitting: Critical Care Medicine

## 2013-02-03 ENCOUNTER — Encounter (HOSPITAL_COMMUNITY): Payer: Self-pay | Admitting: Critical Care Medicine

## 2013-02-03 ENCOUNTER — Ambulatory Visit (HOSPITAL_COMMUNITY): Payer: Medicare Other

## 2013-02-03 ENCOUNTER — Ambulatory Visit (HOSPITAL_COMMUNITY)
Admission: RE | Admit: 2013-02-03 | Discharge: 2013-02-03 | Disposition: A | Discharge status: Home / Self Care | Payer: Medicare Other | Source: Ambulatory Visit | Attending: Cardiothoracic Surgery | Admitting: Cardiothoracic Surgery

## 2013-02-03 ENCOUNTER — Encounter (HOSPITAL_COMMUNITY): Payer: Medicare Other | Admitting: Critical Care Medicine

## 2013-02-03 ENCOUNTER — Encounter (HOSPITAL_COMMUNITY)
Admission: RE | Disposition: A | Discharge status: Home / Self Care | Payer: Self-pay | Source: Ambulatory Visit | Attending: Cardiothoracic Surgery

## 2013-02-03 DIAGNOSIS — J910 Malignant pleural effusion: Secondary | ICD-10-CM | POA: Diagnosis present

## 2013-02-03 DIAGNOSIS — J90 Pleural effusion, not elsewhere classified: Secondary | ICD-10-CM

## 2013-02-03 DIAGNOSIS — G250 Essential tremor: Secondary | ICD-10-CM | POA: Insufficient documentation

## 2013-02-03 DIAGNOSIS — I10 Essential (primary) hypertension: Secondary | ICD-10-CM | POA: Insufficient documentation

## 2013-02-03 DIAGNOSIS — F3289 Other specified depressive episodes: Secondary | ICD-10-CM | POA: Insufficient documentation

## 2013-02-03 DIAGNOSIS — F329 Major depressive disorder, single episode, unspecified: Secondary | ICD-10-CM | POA: Insufficient documentation

## 2013-02-03 DIAGNOSIS — E785 Hyperlipidemia, unspecified: Secondary | ICD-10-CM | POA: Insufficient documentation

## 2013-02-03 DIAGNOSIS — Z950 Presence of cardiac pacemaker: Secondary | ICD-10-CM | POA: Insufficient documentation

## 2013-02-03 DIAGNOSIS — Z8673 Personal history of transient ischemic attack (TIA), and cerebral infarction without residual deficits: Secondary | ICD-10-CM | POA: Insufficient documentation

## 2013-02-03 DIAGNOSIS — Z853 Personal history of malignant neoplasm of breast: Secondary | ICD-10-CM | POA: Insufficient documentation

## 2013-02-03 DIAGNOSIS — M129 Arthropathy, unspecified: Secondary | ICD-10-CM | POA: Insufficient documentation

## 2013-02-03 DIAGNOSIS — E049 Nontoxic goiter, unspecified: Secondary | ICD-10-CM | POA: Insufficient documentation

## 2013-02-03 DIAGNOSIS — E119 Type 2 diabetes mellitus without complications: Secondary | ICD-10-CM | POA: Insufficient documentation

## 2013-02-03 HISTORY — DX: Cardiac murmur, unspecified: R01.1

## 2013-02-03 HISTORY — PX: CHEST TUBE INSERTION: SHX231

## 2013-02-03 HISTORY — DX: Shortness of breath: R06.02

## 2013-02-03 LAB — GLUCOSE, CAPILLARY: Glucose-Capillary: 124 mg/dL — ABNORMAL HIGH (ref 70–99)

## 2013-02-03 SURGERY — INSERTION, PLEURAL DRAINAGE CATHETER
Anesthesia: Monitor Anesthesia Care | Site: Chest | Laterality: Right | Wound class: Clean Contaminated

## 2013-02-03 MED ORDER — LACTATED RINGERS IV SOLN
INTRAVENOUS | Status: DC | PRN
  Administered 2013-02-03: 07:00:00 via INTRAVENOUS

## 2013-02-03 MED ORDER — MEPERIDINE HCL 25 MG/ML IJ SOLN: 6.2500 mg | INTRAMUSCULAR | Status: DC | PRN

## 2013-02-03 MED ORDER — ONDANSETRON HCL 4 MG/2ML IJ SOLN: 4.0000 mg | Freq: Once | INTRAMUSCULAR | Status: DC | PRN

## 2013-02-03 MED ORDER — HYDROMORPHONE HCL PF 1 MG/ML IJ SOLN: 0.2500 mg | INTRAMUSCULAR | Status: DC | PRN

## 2013-02-03 MED ORDER — 0.9 % SODIUM CHLORIDE (POUR BTL) OPTIME
TOPICAL | Status: DC | PRN
  Administered 2013-02-03: 1000 mL

## 2013-02-03 MED ORDER — LIDOCAINE HCL (PF) 1 % IJ SOLN
INTRAMUSCULAR | Status: DC | PRN
  Administered 2013-02-03: 30 mL

## 2013-02-03 MED ORDER — OXYCODONE HCL 5 MG PO TABS: 5.0000 mg | ORAL_TABLET | Freq: Once | ORAL | Status: DC | PRN

## 2013-02-03 MED ORDER — TRAMADOL HCL 50 MG PO TABS: 50.0000 mg | ORAL_TABLET | Freq: Four times a day (QID) | ORAL | Status: AC | PRN

## 2013-02-03 MED ORDER — OXYCODONE HCL 5 MG/5ML PO SOLN: 5.0000 mg | Freq: Once | ORAL | Status: DC | PRN

## 2013-02-03 MED ORDER — FENTANYL CITRATE 0.05 MG/ML IJ SOLN
INTRAMUSCULAR | Status: DC | PRN
  Administered 2013-02-03: 25 ug via INTRAVENOUS
  Administered 2013-02-03: 50 ug via INTRAVENOUS
  Administered 2013-02-03: 25 ug via INTRAVENOUS

## 2013-02-03 MED ORDER — MIDAZOLAM HCL 5 MG/5ML IJ SOLN
INTRAMUSCULAR | Status: DC | PRN
  Administered 2013-02-03: 1 mg via INTRAVENOUS

## 2013-02-03 SURGICAL SUPPLY — 27 items
ADH SKN CLS APL DERMABOND .7 (GAUZE/BANDAGES/DRESSINGS) ×1
BRUSH SCRUB EZ PLAIN DRY (MISCELLANEOUS) ×4 IMPLANT
CANISTER SUCTION 2500CC (MISCELLANEOUS) ×2 IMPLANT
COVER PROBE W GEL 5X96 (DRAPES) ×1 IMPLANT
COVER SURGICAL LIGHT HANDLE (MISCELLANEOUS) ×2 IMPLANT
COVER TRANSDUCER ULTRASND GEL (DRAPE) ×2 IMPLANT
DERMABOND ADVANCED (GAUZE/BANDAGES/DRESSINGS) ×1
DERMABOND ADVANCED .7 DNX12 (GAUZE/BANDAGES/DRESSINGS) ×1 IMPLANT
DRAPE C-ARM 42X72 X-RAY (DRAPES) ×2 IMPLANT
DRAPE LAPAROSCOPIC ABDOMINAL (DRAPES) ×2 IMPLANT
GLOVE BIO SURGEON STRL SZ 6.5 (GLOVE) ×4 IMPLANT
GLOVE BIOGEL PI IND STRL 7.0 (GLOVE) IMPLANT
GLOVE BIOGEL PI INDICATOR 7.0 (GLOVE) ×5
GOWN STRL NON-REIN LRG LVL3 (GOWN DISPOSABLE) ×6 IMPLANT
KIT BASIN OR (CUSTOM PROCEDURE TRAY) ×2 IMPLANT
KIT PLEURX DRAIN CATH 1000ML (MISCELLANEOUS) ×3 IMPLANT
KIT PLEURX DRAIN CATH 15.5FR (DRAIN) ×2 IMPLANT
KIT ROOM TURNOVER OR (KITS) ×2 IMPLANT
NS IRRIG 1000ML POUR BTL (IV SOLUTION) ×2 IMPLANT
PACK GENERAL/GYN (CUSTOM PROCEDURE TRAY) ×2 IMPLANT
PAD ARMBOARD 7.5X6 YLW CONV (MISCELLANEOUS) ×4 IMPLANT
SET DRAINAGE LINE (MISCELLANEOUS) IMPLANT
SUT ETHILON 3 0 FSL (SUTURE) ×2 IMPLANT
SUT VIC AB 3-0 X1 27 (SUTURE) ×2 IMPLANT
TOWEL OR 17X24 6PK STRL BLUE (TOWEL DISPOSABLE) ×2 IMPLANT
TOWEL OR 17X26 10 PK STRL BLUE (TOWEL DISPOSABLE) ×2 IMPLANT
WATER STERILE IRR 1000ML POUR (IV SOLUTION) ×2 IMPLANT

## 2013-02-03 NOTE — Brief Op Note (Addendum)
      [address]       [place] [address]             [phone]      02/03/2013  8:22 AM  PATIENT:  [name]  78 y.o. female  PRE-OPERATIVE DIAGNOSIS:  RIGHT PLEURAL EFFUSION/ recurrent malignant   POST-OPERATIVE DIAGNOSIS: same  PROCEDURE:  Procedure(s): INSERTION PLEURAL DRAINAGE CATHETER (Right)  SURGEON:  Surgeon(s) and Role:    * Delight Ovens, MD - Primary   ANESTHESIA:   MAC  EBL:  Total I/O In: 400 [I.V.:400] Out: 5 [Blood:5]  BLOOD ADMINISTERED:none  DRAINS: right pleurix drain   LOCAL MEDICATIONS USED:  LIDOCAINE   SPECIMEN:  Source of Specimen:  right pleural effusion, 1500 ml was removed  DISPOSITION OF SPECIMEN:  PATHOLOGY  COUNTS:  YES   DICTATION: .Dragon Dictation  PLAN OF CARE: Discharge to home after PACU  PATIENT DISPOSITION:  PACU - hemodynamically stable.   Delay start of Pharmacological VTE agent (>24hrs) due to surgical blood loss or risk of bleeding: yes, on plavix

## 2013-02-03 NOTE — Progress Notes (Signed)
Call to Dr. Diamantina Monks. No answer.   Call to [facility]. [employer] to page [employer] representative.  Spoke with Dr. Diamantina Monks and he said we can use a magnet.

## 2013-02-03 NOTE — Anesthesia Postprocedure Evaluation (Signed)
Anesthesia Post Note  Patient: [name]  Procedure(s) Performed: Procedure(s) (LRB): INSERTION PLEURAL DRAINAGE CATHETER (Right)  Anesthesia type: general  Patient location: PACU  Post pain: Pain level controlled  Post assessment: Patient's Cardiovascular Status Stable  Last Vitals:  Filed Vitals:   02/03/13 1037  BP: 158/82  Pulse: 62  Temp:   Resp: 15    Post vital signs: Reviewed and stable  Level of consciousness: sedated  Complications: No apparent anesthesia complications

## 2013-02-03 NOTE — Anesthesia Procedure Notes (Signed)
Procedure Name: MAC Date/Time: 02/03/2013 7:32 AM Performed by: Elon Alas Pre-anesthesia Checklist: Patient identified, Timeout performed, Emergency Drugs available, Suction available and Patient being monitored Patient Re-evaluated:Patient Re-evaluated prior to inductionOxygen Delivery Method: Simple face mask Intubation Type: IV induction Placement Confirmation: positive ETCO2 and breath sounds checked- equal and bilateral Dental Injury: Teeth and Oropharynx as per pre-operative assessment

## 2013-02-03 NOTE — Progress Notes (Signed)
Pt gone for xray

## 2013-02-03 NOTE — Anesthesia Preprocedure Evaluation (Addendum)
Anesthesia Evaluation  Patient identified by MRN, date of birth, ID band Patient awake    Reviewed: Allergy & Precautions, H&P , NPO status , Patient's Chart, lab work & pertinent test results, reviewed documented beta blocker date and time   History of Anesthesia Complications (+) PONV and history of anesthetic complications  Airway Mallampati: I TM Distance: >3 FB Neck ROM: Full    Dental  (+) Dental Advisory Given   Pulmonary shortness of breath,          Cardiovascular hypertension, Pt. on home beta blockers + Peripheral Vascular Disease + dysrhythmias + pacemaker     Neuro/Psych PSYCHIATRIC DISORDERS Depression CVA    GI/Hepatic   Endo/Other  diabetes, Oral Hypoglycemic Agents  Renal/GU      Musculoskeletal   Abdominal   Peds  Hematology   Anesthesia Other Findings   Reproductive/Obstetrics Breast cancer                        Anesthesia Physical Anesthesia Plan  ASA: III  Anesthesia Plan: MAC   Post-op Pain Management:    Induction: Intravenous  Airway Management Planned: Simple Face Mask  Additional Equipment:   Intra-op Plan:   Post-operative Plan:   Informed Consent: I have reviewed the patients History and Physical, chart, labs and discussed the procedure including the risks, benefits and alternatives for the proposed anesthesia with the patient or authorized representative who has indicated his/her understanding and acceptance.   Dental advisory given  Plan Discussed with: Surgeon and CRNA  Anesthesia Plan Comments:        Anesthesia Quick Evaluation

## 2013-02-03 NOTE — Progress Notes (Signed)
Waiting on case management

## 2013-02-03 NOTE — H&P (Signed)
[address]       [place] [address]             [phone]                      [name] [facility] Medical Record #[id number] Date of Birth: October 23, 1934  Referring: Dr Sherene Sires Primary Care: [name], MD  Chief Complaint:    Increasing SOB, rt pleural effusion recurrent   History of Present Illness:    [name] 78 y.o. female is seen in the office  today for recurrent right pleural effusion. Patient has a history of  TNM : pT2, pN2a, pMX Estrogen receptor Positive (38%) Progesterone receptor Positive (10%) Ki 67 (Mib-1) 41% Her 2 neu (HercepTest) Negative (1.5)    She underwent preoperative chemotherapy, mastectomy12/10/2006 in followup radiation therapy.  She recently saw Dr. Sherene Sires for 2-3 months of increasing shortness of breath. A thoracentesis was done on the right and confirmed metastatic adenocarcinoma. Patient is referred to thoracic surgery.     Current Activity/ Functional Status:  Patient is independent with mobility/ambulation, transfers, ADL's, IADL's.  Zubrod Score: At the time of surgery this patient's most appropriate activity status/level should be described as: []  Normal activity, no symptoms []  Symptoms, fully ambulatory [x]  Symptoms, in bed less than or equal to 50% of the time []  Symptoms, in bed greater than 50% of the time but less than 100% []  Bedridden []  Moribund   Past Medical History  Diagnosis Date  . Goiter   . Arthritis   . Dizziness - light-headed   . Breast cancer   . Menopause   . Slow heart rate   . Hyperlipidemia   . PONV (postoperative nausea and vomiting)   . Hypertension     dr croitoru   SE  . Sinus node dysfunction 03/27/2011    St.Jude pacemaker  . Stroke     TIA per patient history form dated 02/15/10.  . Diabetes mellitus     Type 2  . Cancer     rt. breast ca  . Family history of anesthesia complication     Daughter Nausea/Vomitting  . Depression   . Familial tremor    takes Inderal  (tremor of head)  . Hyponatremia   . Rectal bleeding   . Atrial tachycardia   . History of breast cancer, right, T3, N1, mastectomy 02/27/2007. 03/23/2011    F/u By Ezzard Standing - sp RT to Rt and chemo   . Pacemaker 03/27/11    left  . Heart murmur   . Shortness of breath     Past Surgical History  Procedure Laterality Date  . Joint replacement      Knee - ask patient to clarify which knee or both.  . Gallbladder surgery    . Mastectomy  2008  . Thyroid surgery      due to Goiter  . Cholecystectomy    . Total knee arthroplasty  2008    left  . Cardiac catheterization  07/24/2010    no CAD  . Colonscopy    . Permanent pacemaker insertion  03/27/2011    St.Jude  . Goiter      remove  . Breast surgery Right 2009    lumpectomy  . Brain surgery  03/2009    Stent placement- with TIA    Family History  Problem Relation Age of Onset  . COPD Mother     was  a smoker  . Cancer Father     lung, colon-was a smoker  . Cancer Maternal Aunt     breast  . Cancer Maternal Grandmother     breast  . Rheum arthritis Mother     History   Social History  . Marital Status: Widowed    Spouse Name: N/A    Number of Children: N/A  . Years of Education: N/A   Occupational History  . Book Keeper    Social History Main Topics  . Smoking status: Never Smoker   . Smokeless tobacco: Never Used     Comment: lived with a smoker for most of her life  . Alcohol Use: No  . Drug Use: No  . Sexual Activity: No   Other Topics Concern  . Not on file   Social History Narrative  . No narrative on file    History  Smoking status  . Never Smoker   Smokeless tobacco  . Never Used    Comment: lived with a smoker for most of her life    History  Alcohol Use No     Allergies  Allergen Reactions  . Oxycodone Hcl Nausea And Vomiting  . Psyllium   . Zofran [Ondansetron Hcl] Swelling  . Crestor [Rosuvastatin Calcium] Other (See Comments)    dizziness  . Statins Other (See  Comments)    Cramping in legs  . Zetia [Ezetimibe] Other (See Comments)    dizziness    Current Facility-Administered Medications  Medication Dose Route Frequency Provider Last Rate Last Dose  . cefUROXime (ZINACEF) 1.5 g in dextrose 5 % 50 mL IVPB  1.5 g Intravenous 60 min Pre-Op Delight Ovens, MD        Review of Systems:     Cardiac Review of Systems: Y or N  Chest Pain [  n  ]  Resting SOB [ y  ] Exertional SOB  Cove.Etienne  ]  Orthopnea [ y ]   Pedal Edema [ n  ]    Palpitations [ n ] Syncope  [n ]   Presyncope [ n  ]  General Review of Systems: [Y] = yes [  ]=no Constitional: recent weight change [  ];  Wt loss over the last 3 months [ n  ] anorexia [  ]; fatigue [ y ]; nausea [ n ]; night sweats [  ]; fever [  ]; or chills [  ];          Dental: poor dentition[  ]; Last Dentist visit:   Eye : blurred vision [  ]; diplopia [   ]; vision changes [  ];  Amaurosis fugax[  ]; Resp: cough [  ];  wheezing[  ];  hemoptysis[  ]; shortness of breath[  ]; paroxysmal nocturnal dyspnea[  ]; dyspnea on exertion[  ]; or orthopnea[  ];  GI:  gallstones[  ], vomiting[  ];  dysphagia[  ]; melena[  ];  hematochezia [  ]; heartburn[  ];   Hx of  Colonoscopy[  ]; GU: kidney stones [  ]; hematuria[  ];   dysuria [  ];  nocturia[  ];  history of     obstruction [  ]; urinary frequency [  ]             Skin: rash, swelling[  ];, hair loss[  ];  peripheral edema[  ];  or itching[  ]; Musculosketetal: myalgias[  ];  joint swelling[  ];  joint erythema[  ];  joint pain[  ];  back pain[  ];  Heme/Lymph: bruising[  ];  bleeding[  ];  anemia[  ];  Neuro: TIA[  ];  headaches[  ];  Stroke[yx2   ];  vertigo[  ];  seizures[  ];   paresthesias[  ];  difficulty walking[  ];  Psych:depression[  ]; anxiety[  ];  Endocrine: diabetes[  ];  thyroid dysfunction[  ];  Immunizations: Flu up to date [ y ]; Pneumococcal up to date [ y ];  Other:  Physical Exam: BP 153/85  Pulse 63  Temp(Src) 98.2 F (36.8 C) (Oral)  Resp  20  Ht 5\' 6"  (1.676 m)  Wt 205 lb (92.987 kg)  BMI 33.10 kg/m2  SpO2 95%    General appearance: alert, cooperative and appears stated age Neurologic: intact Heart: regular rate and rhythm, S1, S2 normal, no murmur, click, rub or gallop Lungs: diminished breath sounds RLL and RML Abdomen: soft, non-tender; bowel sounds normal; no masses,  no organomegaly Extremities: extremities normal, atraumatic, no cyanosis or edema and Homans sign is negative, no sign of DVT Patient has healed right mastectomy scar, there are no palpable nodules or axillary adenopathy appreciated she did not have any arm swelling.  Diagnostic Studies & Laboratory data:     Recent Radiology Findings:  Dg Chest 2 View  02/02/2013   CLINICAL DATA:  Shortness of breath  EXAM: CHEST  2 VIEW  COMPARISON:  01/22/2013  FINDINGS: Increasing right-sided pleural effusion is noted when compare with the prior exam. Postsurgical changes in the right axilla are seen. Prior right mastectomy is noted. A pacing device is again seen and stable. The cardiac shadow is unchanged. No acute bony abnormality is seen. Chronic compression deformity is noted at the thoracolumbar junction.  IMPRESSION: Increasing right-sided pleural effusion.   Electronically Signed   By: Alcide Clever M.D.   On: 02/02/2013 16:54    Dg Chest 1 View  01/22/2013   CLINICAL DATA:  Post right thoracentesis.  EXAM: CHEST - 1 VIEW  COMPARISON:  01/20/2013  FINDINGS: Decreasing right pleural effusion. No pneumothorax. Right base atelectasis. No confluent opacity on the left. Left pacer is unchanged. Heart is mildly enlarged.  IMPRESSION: Decreasing right effusion following thoracentesis. No pneumothorax. Right base atelectasis.   Electronically Signed   By: Charlett Nose M.D.   On: 01/22/2013 10:31   Dg Chest 2 View  01/20/2013   CLINICAL DATA:  Cough and shortness of breath  EXAM: CHEST  2 VIEW  COMPARISON:  12/16/2012  FINDINGS: Cardiac shadow is stable. A pacing device  is again seen. An\ increasing right-sided pleural effusion is noted. Chronic compression deformities are noted in the thoracic spine.  IMPRESSION: Increasing right-sided pleural effusion.   Electronically Signed   By: Alcide Clever M.D.   On: 01/20/2013 13:14   US Abdomen Complete  01/22/2013   CLINICAL DATA:  History of abdominal swelling and abnormal liver function tests. History of prior cholecystectomy, hypertension, diabetes, and breast carcinoma. History of bile duct dilatation.  EXAM: ULTRASOUND ABDOMEN COMPLETE  COMPARISON:  CT 08/25/2010.  Ultrasound 08/24/2010.  FINDINGS: Gallbladder  There is history of prior cholecystectomy. No gallbladder is evident.  Common bile duct  Diameter: 16 mm. This is slightly dilated post cholecystectomy. On previous study the common bile duct measures 17 mm. There is again slight biliary dilatation. On some images there are few internal echoes within the common bile duct. These may reflect or debris.  There is a 5 mm echogenic focus which may reflect a small calculus within the common bile duct.  Liver  No focal lesion identified. Within normal limits in parenchymal echogenicity. Portal vein is patent with hepatopetal flow. There is slight nodularity of the surface of the liver. This appearance is may be associated with cirrhosis.  IVC  No abnormality visualized.  Pancreas  Visualized portion unremarkable. Portions of the head and tail are obscured by bowel gas.  Spleen  Size and appearance within normal limits.  Splenic length is 7.5 cm.  Right Kidney  Length: Right renal length is 11.0 cm. Echogenicity within normal limits. No mass or hydronephrosis visualized.  Left Kidney  Length: Left renal length is 11.1 cm. Echogenicity within normal limits. No mass or hydronephrosis visualized.  Abdominal aorta  No aneurysm visualized. Maximum diameter is 2.2 cm. Distal portion was obscured by bowel gas.  IMPRESSION: Post cholecystectomy. Common bile duct measures 16 mm. This is  slightly dilated post cholecystectomy. On previous study the common bile duct measures 17 mm. There is again slight biliary dilatation. . On some images there are few internal echoes within the common bile duct. These may reflect debris. There is a 5 mm echogenic focus which may reflect a small calculus within the common bile duct.There is slight nodularity of the surface of the liver. This appearance may be associated with cirrhosis.   Electronically Signed   By: Onalee Hua  Call M.D.   On: 01/22/2013 11:07   US Thoracentesis Asp Pleural Space W/img Guide  01/22/2013   CLINICAL DATA:  Shortness of breath, right-sided pleural effusion. Request diagnostic and therapeutic thoracentesis  EXAM: ULTRASOUND GUIDED RIGHT THORACENTESIS  COMPARISON:  None.  FINDINGS: A total of approximately 1.5 L of clear yellow fluid was removed. A fluid sample wassent for laboratory analysis.  IMPRESSION: Successful ultrasound guided right thoracentesis yielding 1.5 L of pleural fluid.  Read by: [name] PA-C  PROCEDURE: An ultrasound guided thoracentesis was thoroughly discussed with the patient and questions answered. The benefits, risks, alternatives and complications were also discussed. The patient understands and wishes to proceed with the procedure. Written consent was obtained.  Ultrasound was performed to localize and mark an adequate pocket of fluid in the right chest. The area was then prepped and draped in the normal sterile fashion. 1% Lidocaine was used for local anesthesia. Under ultrasound guidance a 19 gauge Yueh catheter was introduced. Thoracentesis was performed. The catheter was removed and a dressing applied.  Complications:  None immediate   Electronically Signed   By: Malachy Moan M.D.   On: 01/22/2013 12:26    PATH:PLEURAL FLUID, RIGHT (SPECIMEN 1 OF 1 COLLECTED 01/22/13): METASTATIC ADENOCARCINOMA, SEE COMMENT. Valinda Hoar MD Pathologist, Electronic Signature (Case signed 01/26/2013) Specimen  Clinical Information hx of breast carcinoma Source Pleural Fluid, Right, (specimen 1 of 1 collected 01/22/13) Comment There are scattered large atypical cells, as well as, rare small glands on the cell block material. Immunohistochemistry reveal additional small glands and scattered cells that are positive for MOC31 and negative for calretinin. Calretinin highlights background mesothelial cells. These findings are consistent with metastatic adenocarcinoma. The patient's history of breast carcinoma is noted. Dr. Sherene Sires was notified 01/26/2013.  Path at time of resection: INVASIVE DUCTAL CARCINOMA, HIGH GRADE, 5.0 CM TNM Code: pT2, pN2a, pMX Breast Prognostic Markers: Case number [id number] Estrogen receptor Positive (38%) Progesterone receptor Positive (10%) Ki 67 (Mib-1) 41% Her 2 neu (HercepTest) Negative (1.5) Her 2 neu by [facility] Not performed  Recent Lab Findings: Lab Results  Component Value Date   WBC 7.8 01/20/2013   HGB 13.2 01/20/2013   HCT 38.8 01/20/2013   PLT 278.0 01/20/2013   GLUCOSE 111* 01/28/2013   CHOL 226* 05/21/2011   TRIG 182* 05/21/2011   HDL 38* 05/21/2011   LDLCALC 152* 05/21/2011   ALT 15 01/20/2013   AST 24 01/20/2013   NA 136 01/28/2013   K 5.1 01/28/2013   CL 99 01/28/2013   CREATININE 1.1 01/28/2013   BUN 20 01/28/2013   CO2 31 01/28/2013   TSH 1.17 01/20/2013   INR 1.09 10/22/2011   HGBA1C 6.9* 05/21/2011      Assessment / Plan:   Right malignant pleural effusion, fairly rapidly increasing after thoracentesis in a patient with previous history of  pT2, pN2a, pMX invasive ductal carcinoma treated with preoperative radiation surgical resection and postop radiation in 2008.  With known malignant pleural effusion which appears to be fairly rapidly reaccumulating up recommended to the patient that we proceed very quickly with placement of Pleurx catheter to control the right malignant pleural effusion before she ends up in respiratory distress or  admitted through the emergency. She is agreeable with this approach. The risks and options have been discussed with her and her daughter in detail. We'll plan to place a right Pleurx catheter tomorrow. Post procedure she will need early followup with oncology to direct ultimate treatment of her underlying malignancy.2    Goiter;  Arthritis;  Dizziness - light-headed;  Hyperlipidemia;  PONV (postoperative nausea and vomiting);  Hypertension;  Sinus node dysfunction (03/27/2011);  Stroke;  Diabetes mellitus;  Cancer;  Family history of anesthesia complication;  Depression; Familial tremor;  Hyponatremia;  Rectal bleeding;  Atrial tachycardia;      The goals risks and alternatives of the planned surgical procedure right pleurix cath have been discussed with the patient in detail. The risks of the procedure including death, infection, stroke, myocardial infarction, bleeding, blood transfusion have all been discussed specifically.  I have quoted Robley Fries a 1 % of perioperative mortality and a complication rate as high as 10 %. The patient's questions have been answered.[name] is willing  to proceed with the planned procedure.     Delight Ovens MD      [address] [place] [place] [address] Office [phone]   Beeper [phone]  02/03/2013 7:16 AM

## 2013-02-03 NOTE — Transfer of Care (Signed)
Immediate Anesthesia Transfer of Care Note  Patient: [name]  Procedure(s) Performed: Procedure(s): INSERTION PLEURAL DRAINAGE CATHETER (Right)  Patient Location: PACU  Anesthesia Type:MAC  Level of Consciousness: awake, alert  and oriented  Airway & Oxygen Therapy: Patient Spontanous Breathing  Post-op Assessment: Report given to PACU RN, Post -op Vital signs reviewed and stable and Patient moving all extremities X 4  Post vital signs: Reviewed and stable  Complications: No apparent anesthesia complications

## 2013-02-04 ENCOUNTER — Encounter: Payer: Medicare Other | Admitting: *Deleted

## 2013-02-04 ENCOUNTER — Ambulatory Visit: Payer: Medicare Other | Admitting: Nurse Practitioner

## 2013-02-04 ENCOUNTER — Encounter (HOSPITAL_COMMUNITY): Payer: Self-pay | Admitting: Cardiothoracic Surgery

## 2013-02-04 DIAGNOSIS — C50919 Malignant neoplasm of unspecified site of unspecified female breast: Secondary | ICD-10-CM

## 2013-02-04 NOTE — Op Note (Signed)
[name], [name]                ACCOUNT NO.:  [id number]  MEDICAL RECORD NO.:  [id number]  LOCATION:  [facility]                         FACILITY:  [facility]  PHYSICIAN:  Sheliah Plane, MD    DATE OF BIRTH:  March 08, 1935  DATE OF PROCEDURE:  02/03/2013 DATE OF DISCHARGE:  02/03/2013                              OPERATIVE REPORT   PREOPERATIVE DIAGNOSIS:  Malignant right pleural effusion, recurrent.  POSTOPERATIVE DIAGNOSIS:  Malignant right pleural effusion, recurrent.  SURGICAL PROCEDURE:  Placement of right PleurX catheter and drainage of pleural effusion.  SURGEON:  Sheliah Plane, MD.  BRIEF HISTORY:  The patient is a 78 year old female who is known to have a staged T2, N2a estrogen receptor positive,  HER2 negative breast cancer, treated with preoperative chemotherapy, mastectomy and followup radiation therapy in 2008.  She now presents with 2-3 months of increasing shortness of breath.  A thoracentesis was performed which confirmed metastatic carcinoma in a short interval.  The patient developed increasing pleural effusion again.  Placement of PleurX catheter was recommended to her.  She agreed and signed informed consent.  DESCRIPTION OF PROCEDURE:  The patient with IV sedation was placed on the operating room table with the right side tilted upward.  Appropriate time-out was performed under MAC anesthesia.  The right chest was prepped with Betadine and draped in sterile manner using the SonoSite ultrasound.  Pleural fluid was identified.  A 16-gauge needle was introduced into the right pleural space.  Through this, a guide wire was placed into the right chest and position confirmed by fluoroscopy.  A counter incision was made slightly more anterior and a subcutaneous tunnel was created, and a PleurX catheter was tunneled.  Serial dilators were placed over the guidewire, and then a peel-away sheath positioned in the right chest through this guide wire and the dilator removed  and the PleurX catheter was positioned into the right chest and the sheath removed.  A small incision was closed with interrupted 3-0 Vicryl.  A nylon suture was used to secure the catheter to the skin.  A 1500 mL of straw-colored pleural fluid were removed from the right chest.  This fluid was sent to cytology for cell block and further testing.  After discussion with pathology, the patient tolerated the procedure without obvious complication.  Sponge and needle count was reported as correct at completion of the procedure.  Blood loss was none.  She was transferred to the recovery room for further postoperative care with the plan for discharge home, and home nursing followup later today.     Sheliah Plane, MD     EG/MEDQ  D:  02/04/2013  T:  02/04/2013  Job:  [id number]

## 2013-02-06 ENCOUNTER — Telehealth: Payer: Self-pay | Admitting: Oncology

## 2013-02-06 NOTE — Care Management Note (Signed)
    Page 1 of 1   02/03/2013     10:32:10 AM   CARE MANAGEMENT NOTE 02/03/2013  Patient:  [name], [name]   Account Number:  [id number]  Date Initiated:  02/03/2013  Documentation initiated by:  Avie Arenas  Subjective/Objective Assessment:   Pleurix insertion in PACU     Action/Plan:   Anticipated DC Date:  02/03/2013   Anticipated DC Plan:  HOME W HOME HEALTH SERVICES         Choice offered to / List presented to:          [facility] arranged  HH-1 RN      [facility] agency  [facility]   Status of service:  Completed, signed off Medicare Important Message given?   (If response is "NO", the following Medicare IM given date fields will be blank) Date Medicare IM given:   Date Additional Medicare IM given:    Discharge Disposition:  HOME W HOME HEALTH SERVICES  Per UR Regulation:  Reviewed for med. necessity/level of care/duration of stay  If discussed at Long Length of Stay Meetings, dates discussed:    Comments:  02-03-13 10:00am Avie Arenas, RNBSN [phone] Called to PACU for pateint who has recieved a pleurix cathetor.  Needs HH.  Pateint has used [facility] in past - would like to use again.  Is going to live with daughter initially.  Confirmed address on face sheet of daughters.

## 2013-02-06 NOTE — Telephone Encounter (Signed)
Called pt and left message regarding lab and and Md on December 2014

## 2013-02-07 ENCOUNTER — Emergency Department (HOSPITAL_BASED_OUTPATIENT_CLINIC_OR_DEPARTMENT_OTHER): Payer: Medicare Other

## 2013-02-07 ENCOUNTER — Encounter (HOSPITAL_BASED_OUTPATIENT_CLINIC_OR_DEPARTMENT_OTHER): Payer: Self-pay | Admitting: Emergency Medicine

## 2013-02-07 ENCOUNTER — Emergency Department (HOSPITAL_BASED_OUTPATIENT_CLINIC_OR_DEPARTMENT_OTHER)
Admission: EM | Admit: 2013-02-07 | Discharge: 2013-02-07 | Disposition: A | Discharge status: Home / Self Care | Payer: Medicare Other | Attending: Emergency Medicine | Admitting: Emergency Medicine

## 2013-02-07 DIAGNOSIS — M7989 Other specified soft tissue disorders: Secondary | ICD-10-CM

## 2013-02-07 DIAGNOSIS — I809 Phlebitis and thrombophlebitis of unspecified site: Secondary | ICD-10-CM | POA: Insufficient documentation

## 2013-02-07 DIAGNOSIS — I10 Essential (primary) hypertension: Secondary | ICD-10-CM | POA: Insufficient documentation

## 2013-02-07 DIAGNOSIS — N951 Menopausal and female climacteric states: Secondary | ICD-10-CM | POA: Insufficient documentation

## 2013-02-07 DIAGNOSIS — E049 Nontoxic goiter, unspecified: Secondary | ICD-10-CM | POA: Insufficient documentation

## 2013-02-07 DIAGNOSIS — R011 Cardiac murmur, unspecified: Secondary | ICD-10-CM | POA: Insufficient documentation

## 2013-02-07 DIAGNOSIS — E871 Hypo-osmolality and hyponatremia: Secondary | ICD-10-CM | POA: Insufficient documentation

## 2013-02-07 DIAGNOSIS — R42 Dizziness and giddiness: Secondary | ICD-10-CM | POA: Insufficient documentation

## 2013-02-07 DIAGNOSIS — F329 Major depressive disorder, single episode, unspecified: Secondary | ICD-10-CM | POA: Insufficient documentation

## 2013-02-07 DIAGNOSIS — M79609 Pain in unspecified limb: Secondary | ICD-10-CM

## 2013-02-07 DIAGNOSIS — I82622 Acute embolism and thrombosis of deep veins of left upper extremity: Secondary | ICD-10-CM

## 2013-02-07 DIAGNOSIS — G250 Essential tremor: Secondary | ICD-10-CM | POA: Insufficient documentation

## 2013-02-07 DIAGNOSIS — Z950 Presence of cardiac pacemaker: Secondary | ICD-10-CM | POA: Insufficient documentation

## 2013-02-07 DIAGNOSIS — R0602 Shortness of breath: Secondary | ICD-10-CM | POA: Insufficient documentation

## 2013-02-07 DIAGNOSIS — Z853 Personal history of malignant neoplasm of breast: Secondary | ICD-10-CM | POA: Insufficient documentation

## 2013-02-07 DIAGNOSIS — F3289 Other specified depressive episodes: Secondary | ICD-10-CM | POA: Insufficient documentation

## 2013-02-07 DIAGNOSIS — J910 Malignant pleural effusion: Secondary | ICD-10-CM

## 2013-02-07 DIAGNOSIS — E785 Hyperlipidemia, unspecified: Secondary | ICD-10-CM | POA: Insufficient documentation

## 2013-02-07 DIAGNOSIS — K625 Hemorrhage of anus and rectum: Secondary | ICD-10-CM | POA: Insufficient documentation

## 2013-02-07 DIAGNOSIS — I498 Other specified cardiac arrhythmias: Secondary | ICD-10-CM | POA: Insufficient documentation

## 2013-02-07 DIAGNOSIS — E119 Type 2 diabetes mellitus without complications: Secondary | ICD-10-CM | POA: Insufficient documentation

## 2013-02-07 LAB — CBC WITH DIFFERENTIAL/PLATELET
Basophils Absolute: 0 10*3/uL (ref 0.0–0.1)
Eosinophils Relative: 4 % (ref 0–5)
Lymphocytes Relative: 13 % (ref 12–46)
Lymphs Abs: 1 10*3/uL (ref 0.7–4.0)
MCV: 92.8 fL (ref 78.0–100.0)
Neutro Abs: 5.4 10*3/uL (ref 1.7–7.7)
Neutrophils Relative %: 72 % (ref 43–77)
Platelets: 285 10*3/uL (ref 150–400)
RBC: 3.74 MIL/uL — ABNORMAL LOW (ref 3.87–5.11)
RDW: 12.4 % (ref 11.5–15.5)
WBC: 7.4 10*3/uL (ref 4.0–10.5)

## 2013-02-07 LAB — COMPREHENSIVE METABOLIC PANEL
Albumin: 3 g/dL — ABNORMAL LOW (ref 3.5–5.2)
Alkaline Phosphatase: 61 U/L (ref 39–117)
BUN: 19 mg/dL (ref 6–23)
Chloride: 94 mEq/L — ABNORMAL LOW (ref 96–112)
Creatinine, Ser: 0.9 mg/dL (ref 0.50–1.10)
GFR calc Af Amer: 70 mL/min — ABNORMAL LOW (ref >90)
Glucose, Bld: 158 mg/dL — ABNORMAL HIGH (ref 70–99)
Potassium: 4.5 mEq/L (ref 3.5–5.1)
Sodium: 130 mEq/L — ABNORMAL LOW (ref 135–145)
Total Bilirubin: 0.5 mg/dL (ref 0.3–1.2)

## 2013-02-07 LAB — GLUCOSE, CAPILLARY: Glucose-Capillary: 115 mg/dL — ABNORMAL HIGH (ref 70–99)

## 2013-02-07 MED ORDER — HYDROCODONE-ACETAMINOPHEN 5-300 MG PO TABS: 5.0000 mg | ORAL_TABLET | Freq: Four times a day (QID) | ORAL | Status: AC | PRN

## 2013-02-07 MED ORDER — RIVAROXABAN 15 MG PO TABS: 15.0000 mg | ORAL_TABLET | Freq: Two times a day (BID) | ORAL | Status: DC

## 2013-02-07 MED ORDER — RIVAROXABAN (XARELTO) VTE STARTER PACK (15 & 20 MG): ORAL_TABLET | ORAL | Status: AC

## 2013-02-07 MED ORDER — RIVAROXABAN 15 MG PO TABS
15.0000 mg | ORAL_TABLET | Freq: Once | ORAL | Status: AC
  Administered 2013-02-07: 15 mg via ORAL
  Filled 2013-02-07: qty 1

## 2013-02-07 NOTE — ED Notes (Signed)
Pt discharged home with all belongings, pt alert and ambulatory to wheel chair, 2 new RX prescribed, pt verbalizes understanding of dc instructions, pharmacy came to bedside to thoroughly discuss Xarelto treatment

## 2013-02-07 NOTE — ED Notes (Signed)
Pharmacy is going to bring up the dose of Xarelto and go over the information on the new starter kit

## 2013-02-07 NOTE — ED Notes (Signed)
CBG: 115  Dr. Elesa Massed states that it will be fine for pt to eat.

## 2013-02-07 NOTE — ED Provider Notes (Signed)
TIME SEEN: 4:00 PM  CHIEF COMPLAINT: Left upper extremity swelling  HPI: Pt is a 78 y.o. F with a history of breast cancer status post right-sided mastectomy, hypertension, sinus node dysfunction status post pacemaker placement, stroke, diabetes, history of right-sided malignant pleural effusion with chest tube who presents emergency department as a transfer from med center [facility] with 3 days of left upper extremity swelling. She denies any pain in his arm. No warmth, redness, fevers or chills. No history of injury. No numbness, tingling or focal weakness. Patient reports she did have right-sided lymph nodes removed with her mastectomy but no left lymph nodes removed. She states that 4 days ago, she did have a peripheral IV placed in her left hand but denies any other procedures to this arm. She denies a prior history of PE or DVT. She is currently on Plavix. She denies any chest pain. She has stable, chronic shortness of breath.  ROS: See HPI Constitutional: no fever  Eyes: no drainage  ENT: no runny nose   Cardiovascular:  no chest pain  Resp: Chronic SOB  GI: no vomiting GU: no dysuria Integumentary: no rash  Allergy: no hives  Musculoskeletal: no leg swelling  Neurological: no slurred speech ROS otherwise negative  PAST MEDICAL HISTORY/PAST SURGICAL HISTORY:  Past Medical History  Diagnosis Date  . Goiter   . Arthritis   . Dizziness - light-headed   . Breast cancer   . Menopause   . Slow heart rate   . Hyperlipidemia   . PONV (postoperative nausea and vomiting)   . Hypertension     dr croitoru   SE  . Sinus node dysfunction 03/27/2011    St.Jude pacemaker  . Stroke     TIA per patient history form dated 02/15/10.  . Diabetes mellitus     Type 2  . Cancer     rt. breast ca  . Family history of anesthesia complication     Daughter Nausea/Vomitting  . Depression   . Familial tremor     takes Inderal  (tremor of head)  . Hyponatremia   . Rectal bleeding   . Atrial  tachycardia   . History of breast cancer, right, T3, N1, mastectomy 02/27/2007. 03/23/2011    F/u By Ezzard Standing - sp RT to Rt and chemo   . Pacemaker 03/27/11    left  . Heart murmur   . Shortness of breath     MEDICATIONS:  Prior to Admission medications   Medication Sig Start Date End Date Taking? Authorizing Provider  acetaminophen (TYLENOL) 500 MG tablet Take 500 mg by mouth every 6 (six) hours as needed.    Historical Provider, MD  aspirin 81 MG EC tablet Take 81 mg by mouth every morning. 05/31/11   [name], PA-C  b complex vitamins tablet Take 1 tablet by mouth daily.    Historical Provider, MD  Cholecalciferol (VITAMIN D) 2000 UNITS tablet Take 2,000 Units by mouth every morning.     Historical Provider, MD  clopidogrel (PLAVIX) 75 MG tablet Take 75 mg by mouth daily.    Historical Provider, MD  Cyanocobalamin (VITAMIN B-12) 5000 MCG SUBL Place 5,000 mcg under the tongue daily.    Historical Provider, MD  escitalopram (LEXAPRO) 10 MG tablet Take 5 mg by mouth 2 (two) times daily.  06/25/12   Historical Provider, MD  glipiZIDE (GLUCOTROL XL) 10 MG 24 hr tablet Take 10 mg by mouth daily with breakfast.     Historical Provider, MD  Magnesium 500 MG TABS Take 500 mg by mouth every evening.    Historical Provider, MD  meclizine (ANTIVERT) 25 MG tablet Take 25 mg by mouth 3 (three) times daily as needed.    Historical Provider, MD  metFORMIN (GLUCOPHAGE) 500 MG tablet Take 500 mg by mouth 2 (two) times daily.     Historical Provider, MD  Multiple Vitamin (MULTIVITAMIN WITH MINERALS) TABS Take 1 tablet by mouth every morning.    Historical Provider, MD  omega-3 acid ethyl esters (LOVAZA) 1 G capsule Take 1 g by mouth daily.    Historical Provider, MD  promethazine (PHENERGAN) 25 MG tablet Take 1 tablet (25 mg total) by mouth every 6 (six) hours as needed for nausea. 07/29/12   Dione Booze, MD  propranolol (INDERAL) 40 MG tablet Take 40 mg by mouth 2 (two) times daily.     Historical Provider,  MD  PSYLLIUM PO Take 1 capsule by mouth daily.    Historical Provider, MD  traMADol (ULTRAM) 50 MG tablet Take 50 mg by mouth every 6 (six) hours as needed for pain.    Historical Provider, MD  traMADol (ULTRAM) 50 MG tablet Take 1 tablet (50 mg total) by mouth every 6 (six) hours as needed for moderate pain. 02/03/13   Delight Ovens, MD    ALLERGIES:  Allergies  Allergen Reactions  . Oxycodone Hcl Nausea And Vomiting  . Psyllium   . Zofran [Ondansetron Hcl] Swelling  . Crestor [Rosuvastatin Calcium] Other (See Comments)    dizziness  . Statins Other (See Comments)    Cramping in legs  . Zetia [Ezetimibe] Other (See Comments)    dizziness    SOCIAL HISTORY:  History  Substance Use Topics  . Smoking status: Never Smoker   . Smokeless tobacco: Never Used     Comment: lived with a smoker for most of her life  . Alcohol Use: No    FAMILY HISTORY: Family History  Problem Relation Age of Onset  . COPD Mother     was a smoker  . Cancer Father     lung, colon-was a smoker  . Cancer Maternal Aunt     breast  . Cancer Maternal Grandmother     breast  . Rheum arthritis Mother     EXAM: BP 137/50  Pulse 68  Temp(Src) 98.2 F (36.8 C) (Oral)  Resp 13  Ht 5\' 6"  (1.676 m)  Wt 205 lb (92.987 kg)  BMI 33.10 kg/m2  SpO2 94% CONSTITUTIONAL: Alert and oriented and responds appropriately to questions. Well-appearing; well-nourished HEAD: Normocephalic EYES: Conjunctivae clear, PERRL ENT: normal nose; no rhinorrhea; moist mucous membranes; pharynx without lesions noted NECK: Supple, no meningismus, no LAD  CARD: RRR; S1 and S2 appreciated; no murmurs, no clicks, no rubs, no gallops RESP: Normal chest excursion without splinting or tachypnea; breath sounds clear and equal bilaterally; no wheezes, no rhonchi, no rales,  ABD/GI: Normal bowel sounds; non-distended; soft, non-tender, no rebound, no guarding BACK:  The back appears normal and is non-tender to palpation, there is  no CVA tenderness EXT:  Swelling of the left upper extremity without pitting edema, no joint effusion, no erythema or warmth, no induration, Normal ROM in all joints; non-tender to palpation; normal capillary refill; no cyanosis; 2+ radial pulses bilaterally, sensation to light touch intact diffusely, both hands are slightly cool to touch   SKIN: Normal color for age and race; warm NEURO: Moves all extremities equally PSYCH: The patient's mood and manner are appropriate.  Grooming and personal hygiene are appropriate.  MEDICAL DECISION MAKING: Patient status transfer from [facility] for ultrasound to rule out DVT in her left upper extremity. Her labs have been unremarkable. Vital signs normal. Chest x-ray shows small pleural effusion on the right with appropriate chest tube placement.  ED PROGRESS: Pt has an axillary DVT and superficial thrombophlebitis in her bacillic vein.  She is currently on an antiplatelet agent (plavix) for CVA prevention but no other anticoagulation.  Her calclulated CrCl is 78.  Will start Xarelto and have her follow up with her PCP early next week to discuss possibly stopping Plavix.  Given patient is hemodynamically stable, comfortable, neurovascularly intact distally, I feel she is safe to go home with outpatient medication. Discussed with patient and her grandson the plan of care and return precautions. Her vertigo patient to followup with her primary care physician next week. She verbalized understanding and is comfortable with plan.     Layla Maw Ward, DO 02/07/13 1750

## 2013-02-07 NOTE — ED Provider Notes (Signed)
CSN: [id number]     Arrival date & time 02/07/13  1204 History   First MD Initiated Contact with Patient 02/07/13 1227     Chief Complaint  Patient presents with  . Arm Swelling    HPI Patient presents with swelling to her left every extremity for last 2-3 days.  Patient has indwelling catheter in her right hemithorax which he has drained on daily basis.  There is a malignant effusion which is been diagnosed recently.  Patient has previous history of mastectomy and breast cancer.  Patient had no significant shortness of breath. Past Medical History  Diagnosis Date  . Goiter   . Arthritis   . Dizziness - light-headed   . Breast cancer   . Menopause   . Slow heart rate   . Hyperlipidemia   . PONV (postoperative nausea and vomiting)   . Hypertension     dr croitoru   SE  . Sinus node dysfunction 03/27/2011    St.Jude pacemaker  . Stroke     TIA per patient history form dated 02/15/10.  . Diabetes mellitus     Type 2  . Cancer     rt. breast ca  . Family history of anesthesia complication     Daughter Nausea/Vomitting  . Depression   . Familial tremor     takes Inderal  (tremor of head)  . Hyponatremia   . Rectal bleeding   . Atrial tachycardia   . History of breast cancer, right, T3, N1, mastectomy 02/27/2007. 03/23/2011    F/u By Ezzard Standing - sp RT to Rt and chemo   . Pacemaker 03/27/11    left  . Heart murmur   . Shortness of breath    Past Surgical History  Procedure Laterality Date  . Gallbladder surgery    . Mastectomy  2008  . Thyroid surgery      due to Goiter  . Cholecystectomy    . Total knee arthroplasty  2008    left  . Colonscopy    . Permanent pacemaker insertion  03/27/2011    St.Jude  . Goiter      remove  . Breast surgery Right 2009    lumpectomy  . Chest tube insertion Right 02/03/2013    Procedure: INSERTION PLEURAL DRAINAGE CATHETER;  Surgeon: Delight Ovens, MD;  Location: [facility] OR;  Service: Thoracic;  Laterality: Right;  . Brain surgery  03/2009     Stent placement- with TIA  . Joint replacement      Knee - ask patient to clarify which knee or both.  . Cardiac catheterization  07/24/2010    no CAD   Family History  Problem Relation Age of Onset  . COPD Mother     was a smoker  . Cancer Father     lung, colon-was a smoker  . Cancer Maternal Aunt     breast  . Cancer Maternal Grandmother     breast  . Rheum arthritis Mother    History  Substance Use Topics  . Smoking status: Never Smoker   . Smokeless tobacco: Never Used     Comment: lived with a smoker for most of her life  . Alcohol Use: No   OB History   Grav Para Term Preterm Abortions TAB SAB Ect Mult Living                 Review of Systems  All other systems reviewed and are negative.    Allergies  Oxycodone hcl;  Psyllium; Zofran; Crestor; Statins; and Zetia  Home Medications   Current Outpatient Rx  Name  Route  Sig  Dispense  Refill  . acetaminophen (TYLENOL) 500 MG tablet   Oral   Take 500 mg by mouth every 6 (six) hours as needed.         Marland Kitchen aspirin 81 MG EC tablet   Oral   Take 81 mg by mouth every morning.         . Cholecalciferol (VITAMIN D) 2000 UNITS tablet   Oral   Take 2,000 Units by mouth every morning.          . clopidogrel (PLAVIX) 75 MG tablet   Oral   Take 75 mg by mouth daily.         . Cyanocobalamin (VITAMIN B-12) 5000 MCG SUBL   Sublingual   Place 5,000 mcg under the tongue daily.         Marland Kitchen escitalopram (LEXAPRO) 10 MG tablet   Oral   Take 5 mg by mouth 2 (two) times daily.          Marland Kitchen glipiZIDE (GLUCOTROL XL) 10 MG 24 hr tablet   Oral   Take 10 mg by mouth daily with breakfast.          . Magnesium 500 MG TABS   Oral   Take 500 mg by mouth every evening.         . meclizine (ANTIVERT) 25 MG tablet   Oral   Take 25 mg by mouth 3 (three) times daily as needed.         . metFORMIN (GLUCOPHAGE) 500 MG tablet   Oral   Take 500 mg by mouth 2 (two) times daily.          . Multiple Vitamin  (MULTIVITAMIN WITH MINERALS) TABS   Oral   Take 1 tablet by mouth every morning.         . promethazine (PHENERGAN) 25 MG tablet   Oral   Take 1 tablet (25 mg total) by mouth every 6 (six) hours as needed for nausea.   30 tablet   0   . propranolol (INDERAL) 40 MG tablet   Oral   Take 40 mg by mouth 2 (two) times daily.          . traMADol (ULTRAM) 50 MG tablet   Oral   Take 1 tablet (50 mg total) by mouth every 6 (six) hours as needed for moderate pain.   30 tablet   0   . Hydrocodone-Acetaminophen 5-300 MG TABS   Oral   Take 5 mg by mouth every 6 (six) hours as needed.   15 each   0   . PSYLLIUM PO   Oral   Take 1 capsule by mouth daily.         . Rivaroxaban (XARELTO STARTER PACK) 15 & 20 MG TBPK      Take as directed on package: Start with one 15mg  tablet by mouth twice a day with food. On Day 22, switch to one 20mg  tablet once a day with food.   51 each   0    BP 148/63  Pulse 65  Temp(Src) 98.2 F (36.8 C) (Oral)  Resp 18  Ht 5\' 6"  (1.676 m)  Wt 205 lb (92.987 kg)  BMI 33.10 kg/m2  SpO2 94% Physical Exam  Nursing note and vitals reviewed. Constitutional: She is oriented to person, place, and time. She appears well-developed and well-nourished. No distress.  HENT:  Head: Normocephalic and atraumatic.  Eyes: Pupils are equal, round, and reactive to light.  Neck: Normal range of motion.  Cardiovascular: Normal rate and intact distal pulses.   Pulmonary/Chest: Effort normal. No respiratory distress.  Abdominal: Normal appearance. She exhibits no distension. There is no tenderness.  Musculoskeletal: She exhibits edema (Left upper extremity).  Neurological: She is alert and oriented to person, place, and time. No cranial nerve deficit.  Skin: Skin is warm and dry. No rash noted.  Psychiatric: She has a normal mood and affect. Her behavior is normal.    ED Course  Procedures (including critical care time) Labs Review Labs Reviewed  COMPREHENSIVE  METABOLIC PANEL - Abnormal; Notable for the following:    Sodium 130 (*)    Chloride 94 (*)    Glucose, Bld 158 (*)    Albumin 3.0 (*)    GFR calc non Af Amer 61 (*)    GFR calc Af Amer 70 (*)    All other components within normal limits  CBC WITH DIFFERENTIAL - Abnormal; Notable for the following:    RBC 3.74 (*)    Hemoglobin 11.7 (*)    HCT 34.7 (*)    All other components within normal limits  GLUCOSE, CAPILLARY - Abnormal; Notable for the following:    Glucose-Capillary 115 (*)    All other components within normal limits   Imaging Review No results found.  EKG Interpretation   None      Discussed with Dr. Fonnie Jarvis, will be transferred to [facility] for ultrasound left upper extremity MDM   !Marland Kitchen  LUE swelling 2.  Malignant Pleural effusion     Nelia Shi, MD 02/13/13 2156

## 2013-02-07 NOTE — ED Notes (Signed)
MD (Ward) at bedside. 

## 2013-02-07 NOTE — ED Notes (Signed)
Pt's family member requests blood sugar check.

## 2013-02-07 NOTE — Progress Notes (Signed)
VASCULAR LAB PRELIMINARY  PRELIMINARY  PRELIMINARY  PRELIMINARY  Left upper extremity venous Doppler completed.    Preliminary report:  There is acute DVT noted in the left axillary vein and acute superficial thrombosis noted in the left basilic vein.  [name], RVT 02/07/2013, 5:21 PM

## 2013-02-07 NOTE — ED Notes (Signed)
Pt having left arm swelling for several days.  Pt is post op right lung pleural drainage tube placement.  Pt having some soreness. Pulses good.

## 2013-02-07 NOTE — ED Notes (Signed)
Pt reports she had a drainage tube placed to her Right chest Tuesday to remove fluid from her pleural cavity. Pt reports she developed swelling to her Left arm 2 days after tube placement. Obvious swelling noted to her Left arm. Pt states she did have an IV placed in her Left hand for procedure on Tuesday. Pt also reports some SOB but denies chest pain

## 2013-02-09 ENCOUNTER — Other Ambulatory Visit: Payer: Self-pay | Admitting: *Deleted

## 2013-02-09 DIAGNOSIS — J90 Pleural effusion, not elsewhere classified: Secondary | ICD-10-CM

## 2013-02-10 ENCOUNTER — Ambulatory Visit
Admission: RE | Admit: 2013-02-10 | Discharge: 2013-02-10 | Disposition: A | Discharge status: Home / Self Care | Payer: Medicare Other | Source: Ambulatory Visit | Attending: Cardiothoracic Surgery | Admitting: Cardiothoracic Surgery

## 2013-02-10 ENCOUNTER — Telehealth: Payer: Self-pay | Admitting: Oncology

## 2013-02-10 ENCOUNTER — Telehealth (HOSPITAL_COMMUNITY): Payer: Self-pay | Admitting: Interventional Radiology

## 2013-02-10 ENCOUNTER — Ambulatory Visit (INDEPENDENT_AMBULATORY_CARE_PROVIDER_SITE_OTHER): Admitting: Cardiothoracic Surgery

## 2013-02-10 ENCOUNTER — Encounter: Payer: Self-pay | Admitting: Cardiothoracic Surgery

## 2013-02-10 VITALS — BP 130/76 | HR 63 | Resp 20 | Ht 66.0 in | Wt 205.0 lb

## 2013-02-10 DIAGNOSIS — Z09 Encounter for follow-up examination after completed treatment for conditions other than malignant neoplasm: Secondary | ICD-10-CM

## 2013-02-10 DIAGNOSIS — J90 Pleural effusion, not elsewhere classified: Secondary | ICD-10-CM

## 2013-02-10 NOTE — Telephone Encounter (Signed)
Pt called and wanted to know if it was ok for her to take Xarelto in addition to the Plavix that she is currently on? I spoke to Dr. Corliss Skains and he said that yes it would be fine for her to take Xarelto in addition to the Plavix due to the fact that she has an acute DVT. I called pt and relayed this message to her. She was in complete agreement w/ this plan of care and expressed very good verbal understanding. I also instructed the pt to call us back ASAP if she had any new issues to arise. She agreed to do so. JM

## 2013-02-10 NOTE — Progress Notes (Signed)
[address]       [place] [address]             [phone]                  [name] [facility] Medical Record #[id number] Date of Birth: March 31, 1934  Sheila Lek, MD Sheila Lek, MD  Chief Complaint:   PostOp Follow Up Visit 02/03/2013  OPERATIVE REPORT  PREOPERATIVE DIAGNOSIS: Malignant right pleural effusion, recurrent.  POSTOPERATIVE DIAGNOSIS: Malignant right pleural effusion, recurrent.  SURGICAL PROCEDURE: Placement of right PleurX catheter and drainage of  pleural effusion.  SURGEON: Sheliah Plane, MD.   History of Present Illness:     Patient returns to the office today in followup after placement of a right Pleurx catheter for recurrent malignant pleural effusion. The patient was [facility] for further evaluation and treatment of her adenocarcinoma presenting as a malignant right pleural effusion. CT scan / bone scan have been ordered. At the time of the placement of a Pleurx catheter additional cell block was processed for further studies.  The patient was seen in the emergency room several days ago with a swollen left arm, previous mastectomy was on the right, she does have a pacemaker in the left subclavian vein. She was started on xarelto for left upper arm DVT.   She has been draining a Pleurx catheter Monday Wednesday and Friday with average drainage of approximately 400 mL.  Her shortness of breath has improved.  History  Smoking status   Never Smoker   Smokeless tobacco   Never Used    Comment: lived with a smoker for most of her life       Allergies  Allergen Reactions   Oxycodone Hcl Nausea And Vomiting   Psyllium    Zofran [Ondansetron Hcl] Swelling   Crestor [Rosuvastatin Calcium] Other (See Comments)    MUSCLE CRAMPS     Statins Other (See Comments)    Cramping in legs   Zetia [Ezetimibe] Other (See Comments)    dizziness    Current Outpatient  Prescriptions  Medication Sig Dispense Refill   acetaminophen (TYLENOL) 500 MG tablet Take 500 mg by mouth every 6 (six) hours as needed.       aspirin 81 MG EC tablet Take 81 mg by mouth every morning.       Cholecalciferol (VITAMIN D) 2000 UNITS tablet Take 2,000 Units by mouth every morning.        clopidogrel (PLAVIX) 75 MG tablet Take 75 mg by mouth daily.       Cyanocobalamin (VITAMIN B-12) 5000 MCG SUBL Place 5,000 mcg under the tongue daily.       escitalopram (LEXAPRO) 10 MG tablet Take 5 mg by mouth 2 (two) times daily.        glipiZIDE (GLUCOTROL XL) 10 MG 24 hr tablet Take 10 mg by mouth daily with breakfast.        Hydrocodone-Acetaminophen 5-300 MG TABS Take 5 mg by mouth every 6 (six) hours as needed.  15 each  0   Magnesium 500 MG TABS Take 500 mg by mouth every evening.       meclizine (ANTIVERT) 25 MG tablet Take 25 mg by mouth 3 (three) times daily as needed.       metFORMIN (GLUCOPHAGE) 500 MG tablet Take 500 mg by mouth 2 (two) times daily.        Multiple Vitamin (  MULTIVITAMIN WITH MINERALS) TABS Take 1 tablet by mouth every morning.       promethazine (PHENERGAN) 25 MG tablet Take 1 tablet (25 mg total) by mouth every 6 (six) hours as needed for nausea.  30 tablet  0   propranolol (INDERAL) 40 MG tablet Take 40 mg by mouth 2 (two) times daily.        PSYLLIUM PO Take 1 capsule by mouth daily.       Rivaroxaban (XARELTO STARTER PACK) 15 & 20 MG TBPK Take as directed on package: Start with one 15mg  tablet by mouth twice a day with food. On Day 22, switch to one 20mg  tablet once a day with food.  51 each  0   traMADol (ULTRAM) 50 MG tablet Take 1 tablet (50 mg total) by mouth every 6 (six) hours as needed for moderate pain.  30 tablet  0   [DISCONTINUED] insulin glargine (LANTUS SOLOSTAR) 100 UNIT/ML injection Inject 5 Units into the skin daily as needed. For blood sugar over 120       No current facility-administered medications for this visit.        Physical Exam: BP 130/76   Pulse 63   Resp 20   Ht 5\' 6"  (1.676 m)   Wt 205 lb (92.987 kg)   BMI 33.10 kg/m2   SpO2 92%  General appearance: alert, cooperative and appears older than stated age Neurologic: intact Heart: regular rate and rhythm, S1, S2 normal, no murmur, click, rub or gallop Lungs: diminished breath sounds RLL Abdomen: soft, non-tender; bowel sounds normal; no masses,  no organomegaly Extremities: No erythema mild swelling in the left arm Pleurx catheter is intact  Diagnostic Studies & Laboratory data:         Recent Radiology Findings: Dg Chest 2 View  02/10/2013   CLINICAL DATA:  Followup of pleural effusion, some shortness of breath  EXAM: CHEST  2 VIEW  COMPARISON:  Portable chest x-ray of 02/07/2013  FINDINGS: There is little change in opacity at the right lung base consistent with right pleural effusion, right basilar atelectasis, and possible pleural parenchymal scarring. The left lung is clear. Permanent pacemaker remains. Mild cardiomegaly is stable. A partially compressed T12 vertebral body is noted on the lateral view which is unchanged compared to the lateral chest x-ray of 02/02/2013  IMPRESSION: 1. No change in pleural and parenchymal opacity at the right lung base consistent with atelectasis, effusion, possibly scarring. 2. Stable partial compression deformity of T12.   Electronically Signed   By: Dwyane Dee M.D.   On: 02/10/2013 15:40      Recent Labs: Lab Results  Component Value Date   WBC 7.4 02/07/2013   HGB 11.7* 02/07/2013   HCT 34.7* 02/07/2013   PLT 285 02/07/2013   GLUCOSE 158* 02/07/2013   CHOL 226* 05/21/2011   TRIG 182* 05/21/2011   HDL 38* 05/21/2011   LDLCALC 152* 05/21/2011   ALT 9 02/07/2013   AST 18 02/07/2013   NA 130* 02/07/2013   K 4.5 02/07/2013   CL 94* 02/07/2013   CREATININE 0.90 02/07/2013   BUN 19 02/07/2013   CO2 27 02/07/2013   TSH 1.17 01/20/2013   INR 1.09 10/22/2011   HGBA1C 6.9* 05/21/2011    Cytology: Patient: Sheila, [name] Collected: 02/03/2013 Client: [facility] Accession: [id number] Received: 02/03/2013 Sheliah Plane DOB: 1934-07-15 Age: 78 Gender: F Reported: 02/04/2013 [address] Patient Ph: [phone] MRN#: [id number] [place], [place] [address] Client Acc#: Chart:  Phone:  Fax: LMP: Visit#: [id number] CC: CYTOPATHOLOGY REPORT Adequacy Reason Satisfactory For Evaluation. Diagnosis PLEURAL FLUID, RIGHT (SPECIMEN 1 OF 1, COLLECTED ON 02/03/13): MALIGNANT CELLS PRESENT, CONSISTENT WITH ADENOCARCINOMA. Pecola Leisure MD Pathologist, Electronic Signature (Case signed 02/04/2013) Specimen Clinical Information Right pleural effusion Source Pleural Fluid, Right, ( Specimen 1 of 1, collected on 02/03/13 Gross Specimen: Received is/are 2 containers totaling 1300cc's of dark yellow fluid with a piece of tissue. (PH:ph) Prepared: # Smears: 0 # Concentration Technique Slides (i.e. ThinPrep): 1 # Cell Block: 1 Conventional Additional Studies: N/A Report signed out from the following location(s) Technical Component and Interpretation performed at [facility] [address] [place], [place], [place] [address]. CLIA #: [id number],   Assessment / Plan:      #1 continue Monday Wednesday Friday drainage of the Pleurx catheter pending decision about further therapy, I plan to see the patient back in 3 weeks with a chest x-ray.    Eboney Claybrook B 02/10/2013 5:09 PM

## 2013-02-13 ENCOUNTER — Other Ambulatory Visit: Payer: Medicare Other | Admitting: Lab

## 2013-02-13 ENCOUNTER — Ambulatory Visit: Payer: Medicare Other | Admitting: Oncology

## 2013-02-14 ENCOUNTER — Emergency Department (HOSPITAL_BASED_OUTPATIENT_CLINIC_OR_DEPARTMENT_OTHER): Payer: Medicare Other

## 2013-02-14 ENCOUNTER — Emergency Department (HOSPITAL_BASED_OUTPATIENT_CLINIC_OR_DEPARTMENT_OTHER)
Admission: EM | Admit: 2013-02-14 | Discharge: 2013-02-14 | Disposition: A | Discharge status: Home / Self Care | Payer: Medicare Other | Attending: Emergency Medicine | Admitting: Emergency Medicine

## 2013-02-14 ENCOUNTER — Encounter (HOSPITAL_BASED_OUTPATIENT_CLINIC_OR_DEPARTMENT_OTHER): Payer: Self-pay | Admitting: Emergency Medicine

## 2013-02-14 DIAGNOSIS — R42 Dizziness and giddiness: Secondary | ICD-10-CM | POA: Insufficient documentation

## 2013-02-14 DIAGNOSIS — Z8673 Personal history of transient ischemic attack (TIA), and cerebral infarction without residual deficits: Secondary | ICD-10-CM | POA: Insufficient documentation

## 2013-02-14 DIAGNOSIS — G250 Essential tremor: Secondary | ICD-10-CM | POA: Insufficient documentation

## 2013-02-14 DIAGNOSIS — Z853 Personal history of malignant neoplasm of breast: Secondary | ICD-10-CM | POA: Insufficient documentation

## 2013-02-14 DIAGNOSIS — E119 Type 2 diabetes mellitus without complications: Secondary | ICD-10-CM | POA: Insufficient documentation

## 2013-02-14 DIAGNOSIS — W1809XA Striking against other object with subsequent fall, initial encounter: Secondary | ICD-10-CM | POA: Insufficient documentation

## 2013-02-14 DIAGNOSIS — Z7982 Long term (current) use of aspirin: Secondary | ICD-10-CM | POA: Insufficient documentation

## 2013-02-14 DIAGNOSIS — F3289 Other specified depressive episodes: Secondary | ICD-10-CM | POA: Insufficient documentation

## 2013-02-14 DIAGNOSIS — S6990XA Unspecified injury of unspecified wrist, hand and finger(s), initial encounter: Secondary | ICD-10-CM | POA: Insufficient documentation

## 2013-02-14 DIAGNOSIS — Z7902 Long term (current) use of antithrombotics/antiplatelets: Secondary | ICD-10-CM | POA: Insufficient documentation

## 2013-02-14 DIAGNOSIS — Y9301 Activity, walking, marching and hiking: Secondary | ICD-10-CM | POA: Insufficient documentation

## 2013-02-14 DIAGNOSIS — Y9289 Other specified places as the place of occurrence of the external cause: Secondary | ICD-10-CM | POA: Insufficient documentation

## 2013-02-14 DIAGNOSIS — IMO0002 Reserved for concepts with insufficient information to code with codable children: Secondary | ICD-10-CM | POA: Insufficient documentation

## 2013-02-14 DIAGNOSIS — F329 Major depressive disorder, single episode, unspecified: Secondary | ICD-10-CM | POA: Insufficient documentation

## 2013-02-14 DIAGNOSIS — Z950 Presence of cardiac pacemaker: Secondary | ICD-10-CM | POA: Insufficient documentation

## 2013-02-14 DIAGNOSIS — R011 Cardiac murmur, unspecified: Secondary | ICD-10-CM | POA: Insufficient documentation

## 2013-02-14 DIAGNOSIS — E785 Hyperlipidemia, unspecified: Secondary | ICD-10-CM | POA: Insufficient documentation

## 2013-02-14 DIAGNOSIS — Z86718 Personal history of other venous thrombosis and embolism: Secondary | ICD-10-CM | POA: Insufficient documentation

## 2013-02-14 DIAGNOSIS — M129 Arthropathy, unspecified: Secondary | ICD-10-CM | POA: Insufficient documentation

## 2013-02-14 DIAGNOSIS — Z79899 Other long term (current) drug therapy: Secondary | ICD-10-CM | POA: Insufficient documentation

## 2013-02-14 DIAGNOSIS — S59909A Unspecified injury of unspecified elbow, initial encounter: Secondary | ICD-10-CM | POA: Insufficient documentation

## 2013-02-14 DIAGNOSIS — T07XXXA Unspecified multiple injuries, initial encounter: Secondary | ICD-10-CM | POA: Insufficient documentation

## 2013-02-14 DIAGNOSIS — Z95818 Presence of other cardiac implants and grafts: Secondary | ICD-10-CM | POA: Insufficient documentation

## 2013-02-14 LAB — GLUCOSE, CAPILLARY: Glucose-Capillary: 121 mg/dL — ABNORMAL HIGH (ref 70–99)

## 2013-02-14 NOTE — ED Provider Notes (Signed)
CSN: [id number]     Arrival date & time 02/14/13  0756 History   First MD Initiated Contact with Patient 02/14/13 0809     Chief Complaint  Patient presents with  . Fall   (Consider location/radiation/quality/duration/timing/severity/associated sxs/prior Treatment) HPI Comments: Patient presents with shoulder and wrist pain. She states she was walking to the bathroom last night and felt a little dizzy which is not unusual for her. As she was reaching for the door she realized that the door was open and she fell through the threshold onto the bathroom floor. She fell face forward landing more on her right shoulder. She complains of constant mild throbbing pain to her right shoulder and her right wrist. She does say that she bumped her head on the floor but there is no loss of consciousness. She denies any headaches. She denies he nausea or vomiting. She denies any neck or back pain. She is on Xarelto for a LUE DVT. She denies any other injuries from the fall. She's been ambulate pain without problems. She states that due to her underlying medical conditions she frequently has slight episodes of dizziness and she had no unusual symptoms last night such as chest pain shortness of breath or neurologic deficits.  Patient is a 78 y.o. female presenting with fall.  Fall Pertinent negatives include no chest pain, no abdominal pain, no headaches and no shortness of breath.    Past Medical History  Diagnosis Date  . Goiter   . Arthritis   . Dizziness - light-headed   . Breast cancer   . Menopause   . Slow heart rate   . Hyperlipidemia   . PONV (postoperative nausea and vomiting)   . Hypertension     dr croitoru   SE  . Sinus node dysfunction 03/27/2011    St[employer]Jude pacemaker  . Stroke     TIA per patient history form dated 02/15/10.  . Diabetes mellitus     Type 2  . Cancer     rt. breast ca  . Family history of anesthesia complication     Daughter Nausea/Vomitting  . Depression   . Familial  tremor     takes Inderal  (tremor of head)  . Hyponatremia   . Rectal bleeding   . Atrial tachycardia   . History of breast cancer, right, T3, N1, mastectomy 02/27/2007. 03/23/2011    F/u By Ezzard Standing - sp RT to Rt and chemo   . Pacemaker 03/27/11    left  . Heart murmur   . Shortness of breath    Past Surgical History  Procedure Laterality Date  . Gallbladder surgery    . Mastectomy  2008  . Thyroid surgery      due to Goiter  . Cholecystectomy    . Total knee arthroplasty  2008    left  . Colonscopy    . Permanent pacemaker insertion  03/27/2011    St[employer]Jude  . Goiter      remove  . Breast surgery Right 2009    lumpectomy  . Chest tube insertion Right 02/03/2013    Procedure: INSERTION PLEURAL DRAINAGE CATHETER;  Surgeon: Delight Ovens, MD;  Location: [facility] OR;  Service: Thoracic;  Laterality: Right;  . Brain surgery  03/2009    Stent placement- with TIA  . Joint replacement      Knee - ask patient to clarify which knee or both.  . Cardiac catheterization  07/24/2010    no CAD   Family History  Problem Relation Age of Onset  . COPD Mother     was a smoker  . Cancer Father     lung, colon-was a smoker  . Cancer Maternal Aunt     breast  . Cancer Maternal Grandmother     breast  . Rheum arthritis Mother    History  Substance Use Topics  . Smoking status: Never Smoker   . Smokeless tobacco: Never Used     Comment: lived with a smoker for most of her life  . Alcohol Use: No   OB History   Grav Para Term Preterm Abortions TAB SAB Ect Mult Living                 Review of Systems  Constitutional: Negative for fever, chills, diaphoresis and fatigue.  HENT: Negative for congestion, rhinorrhea and sneezing.   Eyes: Negative.   Respiratory: Negative for cough, chest tightness and shortness of breath.   Cardiovascular: Negative for chest pain and leg swelling.  Gastrointestinal: Negative for nausea, vomiting, abdominal pain, diarrhea and blood in stool.   Genitourinary: Negative for frequency, hematuria, flank pain and difficulty urinating.  Musculoskeletal: Positive for arthralgias. Negative for back pain and neck pain.  Skin: Negative for rash.  Neurological: Positive for dizziness. Negative for speech difficulty, weakness, numbness and headaches.  Psychiatric/Behavioral: Negative for confusion.    Allergies  Oxycodone hcl; Psyllium; Zofran; Crestor; Statins; and Zetia  Home Medications   Current Outpatient Rx  Name  Route  Sig  Dispense  Refill  . acetaminophen (TYLENOL) 500 MG tablet   Oral   Take 500 mg by mouth every 6 (six) hours as needed.         Marland Kitchen aspirin 81 MG EC tablet   Oral   Take 81 mg by mouth every morning.         . Cholecalciferol (VITAMIN D) 2000 UNITS tablet   Oral   Take 2,000 Units by mouth every morning.          . clopidogrel (PLAVIX) 75 MG tablet   Oral   Take 75 mg by mouth daily.         . Cyanocobalamin (VITAMIN B-12) 5000 MCG SUBL   Sublingual   Place 5,000 mcg under the tongue daily.         Marland Kitchen escitalopram (LEXAPRO) 10 MG tablet   Oral   Take 5 mg by mouth 2 (two) times daily.          Marland Kitchen glipiZIDE (GLUCOTROL XL) 10 MG 24 hr tablet   Oral   Take 10 mg by mouth daily with breakfast.          . Hydrocodone-Acetaminophen 5-300 MG TABS   Oral   Take 5 mg by mouth every 6 (six) hours as needed.   15 each   0   . Magnesium 500 MG TABS   Oral   Take 500 mg by mouth every evening.         . meclizine (ANTIVERT) 25 MG tablet   Oral   Take 25 mg by mouth 3 (three) times daily as needed.         . metFORMIN (GLUCOPHAGE) 500 MG tablet   Oral   Take 500 mg by mouth 2 (two) times daily.          . Multiple Vitamin (MULTIVITAMIN WITH MINERALS) TABS   Oral   Take 1 tablet by mouth every morning.         Marland Kitchen  promethazine (PHENERGAN) 25 MG tablet   Oral   Take 1 tablet (25 mg total) by mouth every 6 (six) hours as needed for nausea.   30 tablet   0   . propranolol  (INDERAL) 40 MG tablet   Oral   Take 40 mg by mouth 2 (two) times daily.          . PSYLLIUM PO   Oral   Take 1 capsule by mouth daily.         . Rivaroxaban (XARELTO STARTER PACK) 15 & 20 MG TBPK      Take as directed on package: Start with one 15mg  tablet by mouth twice a day with food. On Day 22, switch to one 20mg  tablet once a day with food.   51 each   0   . traMADol (ULTRAM) 50 MG tablet   Oral   Take 1 tablet (50 mg total) by mouth every 6 (six) hours as needed for moderate pain.   30 tablet   0    BP 128/62  Pulse 59  Temp(Src) 98.5 F (36.9 C) (Oral)  Resp 16  Ht 5\' 6"  (1.676 m)  Wt 205 lb (92.987 kg)  BMI 33.10 kg/m2  SpO2 93% Physical Exam  Constitutional: She is oriented to person, place, and time. She appears well-developed and well-nourished.  HENT:  Head: Normocephalic and atraumatic.  Eyes: Pupils are equal, round, and reactive to light.  Neck: Normal range of motion. Neck supple.  Cardiovascular: Normal rate, regular rhythm and normal heart sounds.   Pulmonary/Chest: Effort normal and breath sounds normal. No respiratory distress. She has no wheezes. She has no rales. She exhibits no tenderness.  She has a dressing to the right mid chest where she reports a chest drain. There is no underlying rib tenderness  Abdominal: Soft. Bowel sounds are normal. There is no tenderness. There is no rebound and no guarding.  Musculoskeletal: Normal range of motion. She exhibits no edema.  Patient has some pain on palpation of posterior aspect of the right shoulder, primarily along the muscles in the upper back. There's also some bony tenderness to the lateral and anterior right shoulder. No swelling or deformity is noted. There's some mild tenderness on palpation of the right wrist the distal radius. No swelling or deformity is noted. She has normal sensation motor function and pulses in the right hand. There is no significant pain on range of motion of the right  elbow. There's no pain on palpation or range of motion the other extremities including the hips. She has no pain along the cervical thoracic or lumbosacral spine.  Lymphadenopathy:    She has no cervical adenopathy.  Neurological: She is alert and oriented to person, place, and time.  Skin: Skin is warm and dry. No rash noted.  Psychiatric: She has a normal mood and affect.    ED Course  Procedures (including critical care time) Labs Review Labs Reviewed  GLUCOSE, CAPILLARY - Abnormal; Notable for the following:    Glucose-Capillary 121 (*)    All other components within normal limits   Imaging Review Dg Shoulder Right  02/14/2013   CLINICAL DATA:  Pain status post fall.  EXAM: RIGHT SHOULDER - 2+ VIEW  COMPARISON:  Previous chest x-rays of February 10, 2013 and January 22, 2013 which included portions of the shoulder  FINDINGS: Three views of the left shoulder reveal the bones to be reasonably well mineralized for age. There are degenerative changes of the glenohumeral  joint but there is no evidence of dislocation. The [facility] joint is never well demonstrated in profile but where visualized it is grossly normal. The observed portions of the scapula appear intact. There are subacromial spurs. The observed portions of the clavicle and the upper right ribs exhibit no acute abnormalities but there is old deformity of the anterior lateral aspect of the right 2nd rib.  IMPRESSION: There are degenerative changes of the right shoulder. There is no evidence of an acute fracture. Evaluation of the [facility] joint is limited.   Electronically Signed   By: David  [place]   On: 02/14/2013 08:51   Dg Wrist Complete Right  02/14/2013   CLINICAL DATA:  Right wrist pain secondary to a fall this morning.  EXAM: RIGHT WRIST - COMPLETE 3+ VIEW  COMPARISON:  None.  FINDINGS: There is no acute fracture or dislocation. There is an old deformity of the distal radius secondary to a healed old fracture. Minimal degenerative changes  of the 1st carpal metacarpal joint.  IMPRESSION: No acute abnormalities.  Old deformity of the distal radius.   Electronically Signed   By: Geanie Cooley M.D.   On: 02/14/2013 08:51   Ct Head Wo Contrast  02/14/2013   CLINICAL DATA:  Pain status post fall striking forehead, history of breast malignancy and TIAs  EXAM: CT HEAD WITHOUT CONTRAST  TECHNIQUE: Contiguous axial images were obtained from the base of the skull through the vertex without intravenous contrast.  COMPARISON:  CT scan of the brain dated September 08, 2012.  FINDINGS: There is mild diffuse cerebral and cerebellar atrophy with mild compensatory ventriculomegaly. These findings are age appropriate. There is no evidence of an acute intracranial hemorrhage nor of an evolving ischemic infarction. There are punctate basal ganglia calcifications bilaterally. The cerebellum and brainstem exhibit no acute abnormalities.  At bone window settings there is no evidence of an acute skull fracture. The observed portions of the paranasal sinuses and mastoid air cells are clear.  IMPRESSION: 1. There is no evidence of an acute intracranial hemorrhage nor of an evolving ischemic infarction. 2. There are age-related atrophic changes as well as changes consistent with chronic small vessel ischemia. 3. There is no evidence of an acute skull fracture.   Electronically Signed   By: David  [place]   On: 02/14/2013 08:57    EKG Interpretation   None       MDM   1. Multiple contusions    Patient has no evidence of fracture. There is no evidence of intracranial hemorrhage. She is well-appearing smiling with new acute distress. She was discharged in good condition and encouraged to followup with her primary care physician as needed if her symptoms continue.    Rolan Bucco, MD 02/14/13 [phone]

## 2013-02-14 NOTE — ED Notes (Signed)
Pt was going to the bathroom in the dark and fell.  Pt fell down face down.  No LOC.  Pt c/o right shoulder pain and right wrist pain.

## 2013-02-17 DIAGNOSIS — J910 Malignant pleural effusion: Secondary | ICD-10-CM

## 2013-02-20 ENCOUNTER — Ambulatory Visit: Payer: Medicare Other | Admitting: Internal Medicine

## 2013-02-27 ENCOUNTER — Other Ambulatory Visit: Payer: Self-pay | Admitting: *Deleted

## 2013-02-27 DIAGNOSIS — J90 Pleural effusion, not elsewhere classified: Secondary | ICD-10-CM

## 2013-03-04 ENCOUNTER — Ambulatory Visit: Payer: Medicare Other | Admitting: Cardiothoracic Surgery

## 2013-03-27 ENCOUNTER — Other Ambulatory Visit (HOSPITAL_COMMUNITY): Payer: Self-pay | Admitting: Interventional Radiology

## 2013-03-27 DIAGNOSIS — R42 Dizziness and giddiness: Secondary | ICD-10-CM

## 2013-03-27 DIAGNOSIS — H538 Other visual disturbances: Secondary | ICD-10-CM

## 2013-03-31 ENCOUNTER — Ambulatory Visit: Payer: Medicare Other | Admitting: Internal Medicine

## 2013-04-01 ENCOUNTER — Ambulatory Visit: Payer: Medicare Other | Admitting: Cardiothoracic Surgery

## 2013-04-16 ENCOUNTER — Other Ambulatory Visit: Payer: Self-pay | Admitting: *Deleted

## 2013-04-16 DIAGNOSIS — J90 Pleural effusion, not elsewhere classified: Secondary | ICD-10-CM

## 2013-04-20 ENCOUNTER — Ambulatory Visit: Payer: Medicare Other | Admitting: Cardiothoracic Surgery

## 2013-04-21 ENCOUNTER — Telehealth (HOSPITAL_COMMUNITY): Payer: Self-pay | Admitting: Interventional Radiology

## 2013-04-21 NOTE — Telephone Encounter (Signed)
Called pt's daughter to schedule 6 month f/u CT perfusion study. Pt's daughter states that her mother's cancer has returned and she is a SNF at this time. She is going to check w/ the facility to see about transportation and call me back. She also states that patient is very sick right now and doesn't know if her mom can make it to do this appt as she is throwing up after every meal. JM

## 2013-04-29 ENCOUNTER — Ambulatory Visit: Payer: Medicare Other

## 2013-06-10 DEATH — deceased

## 2013-06-30 ENCOUNTER — Telehealth: Payer: Self-pay | Admitting: Oncology

## 2013-06-30 NOTE — Telephone Encounter (Signed)
, °

## 2013-07-08 ENCOUNTER — Ambulatory Visit: Payer: Medicare Other | Admitting: Oncology

## 2013-07-08 ENCOUNTER — Other Ambulatory Visit: Payer: Medicare Other

## 2014-02-18 ENCOUNTER — Encounter (HOSPITAL_COMMUNITY): Payer: Self-pay | Admitting: Cardiovascular Disease

## 2015-05-13 IMAGING — CR DG CHEST 2V
2 series · 2 of 2 positions shown · non-contrast
Comparison: 12/16/2012

CLINICAL DATA: Cough and shortness of breath

EXAM:
CHEST  2 VIEW

[view not recorded (1 of 2)]
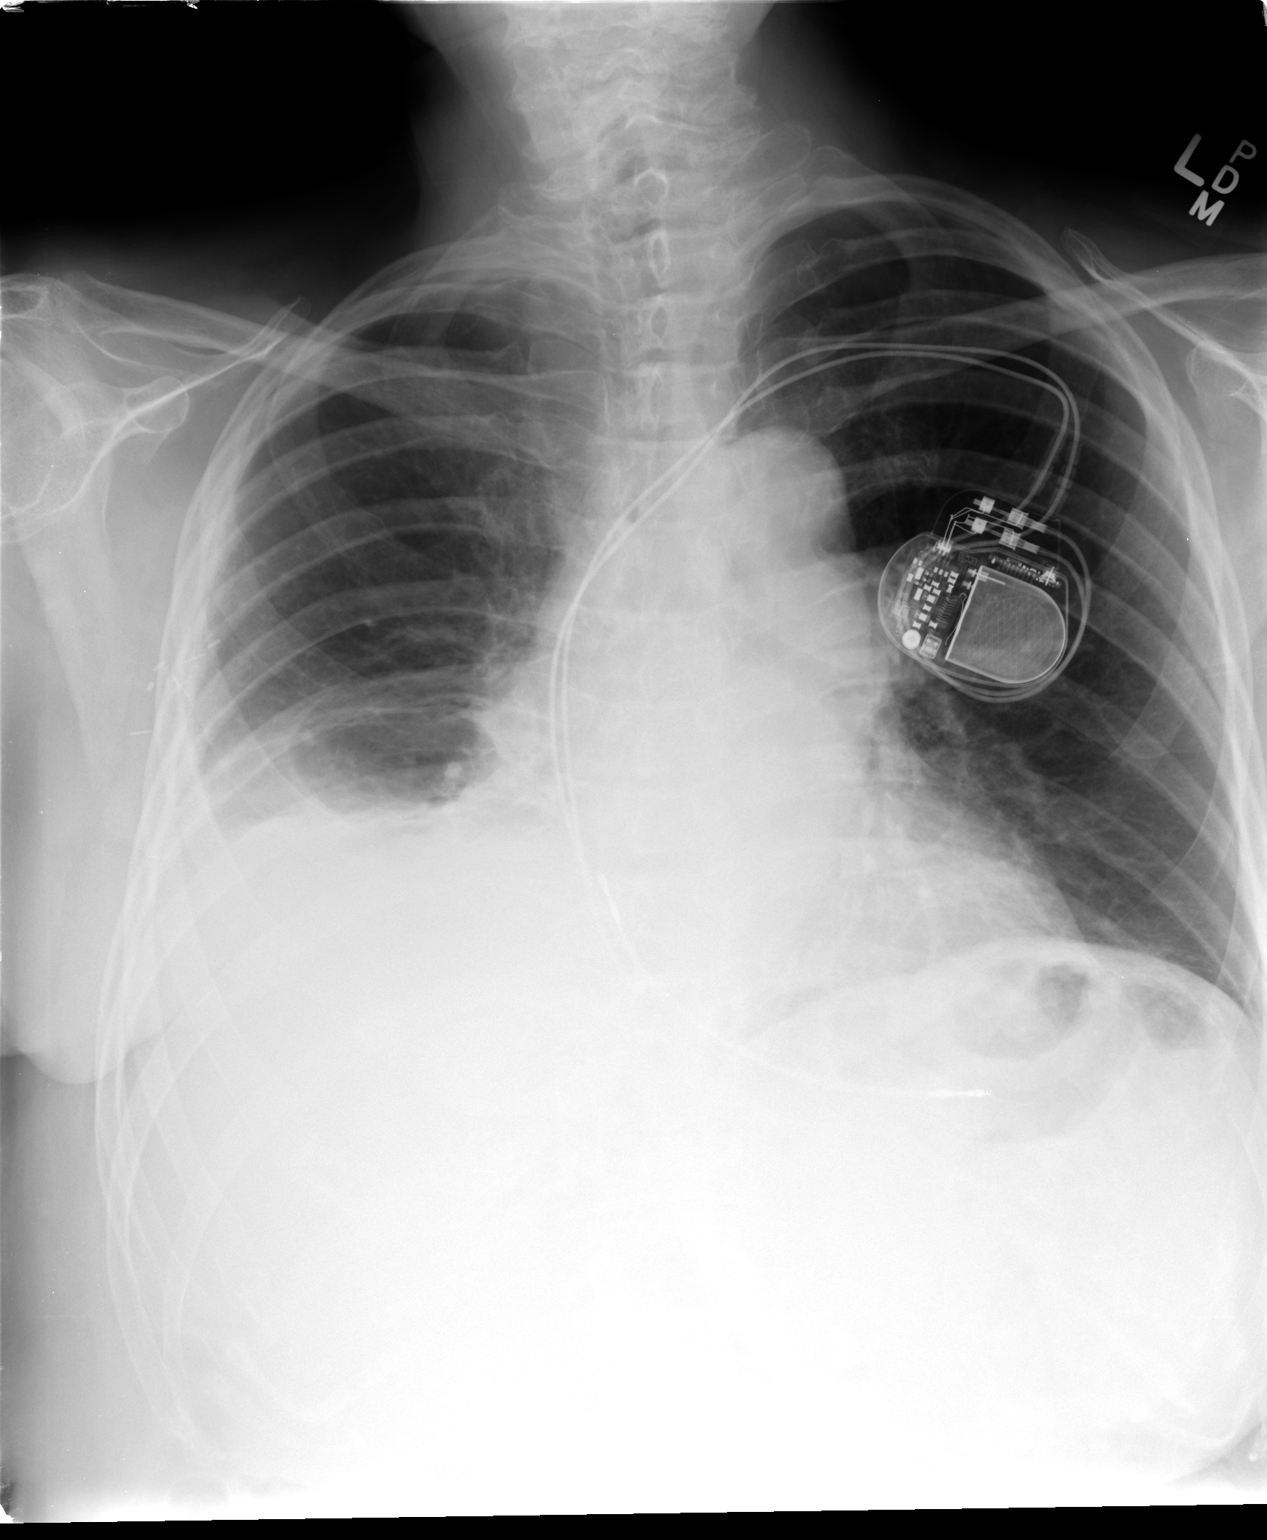

[view not recorded (2 of 2)]
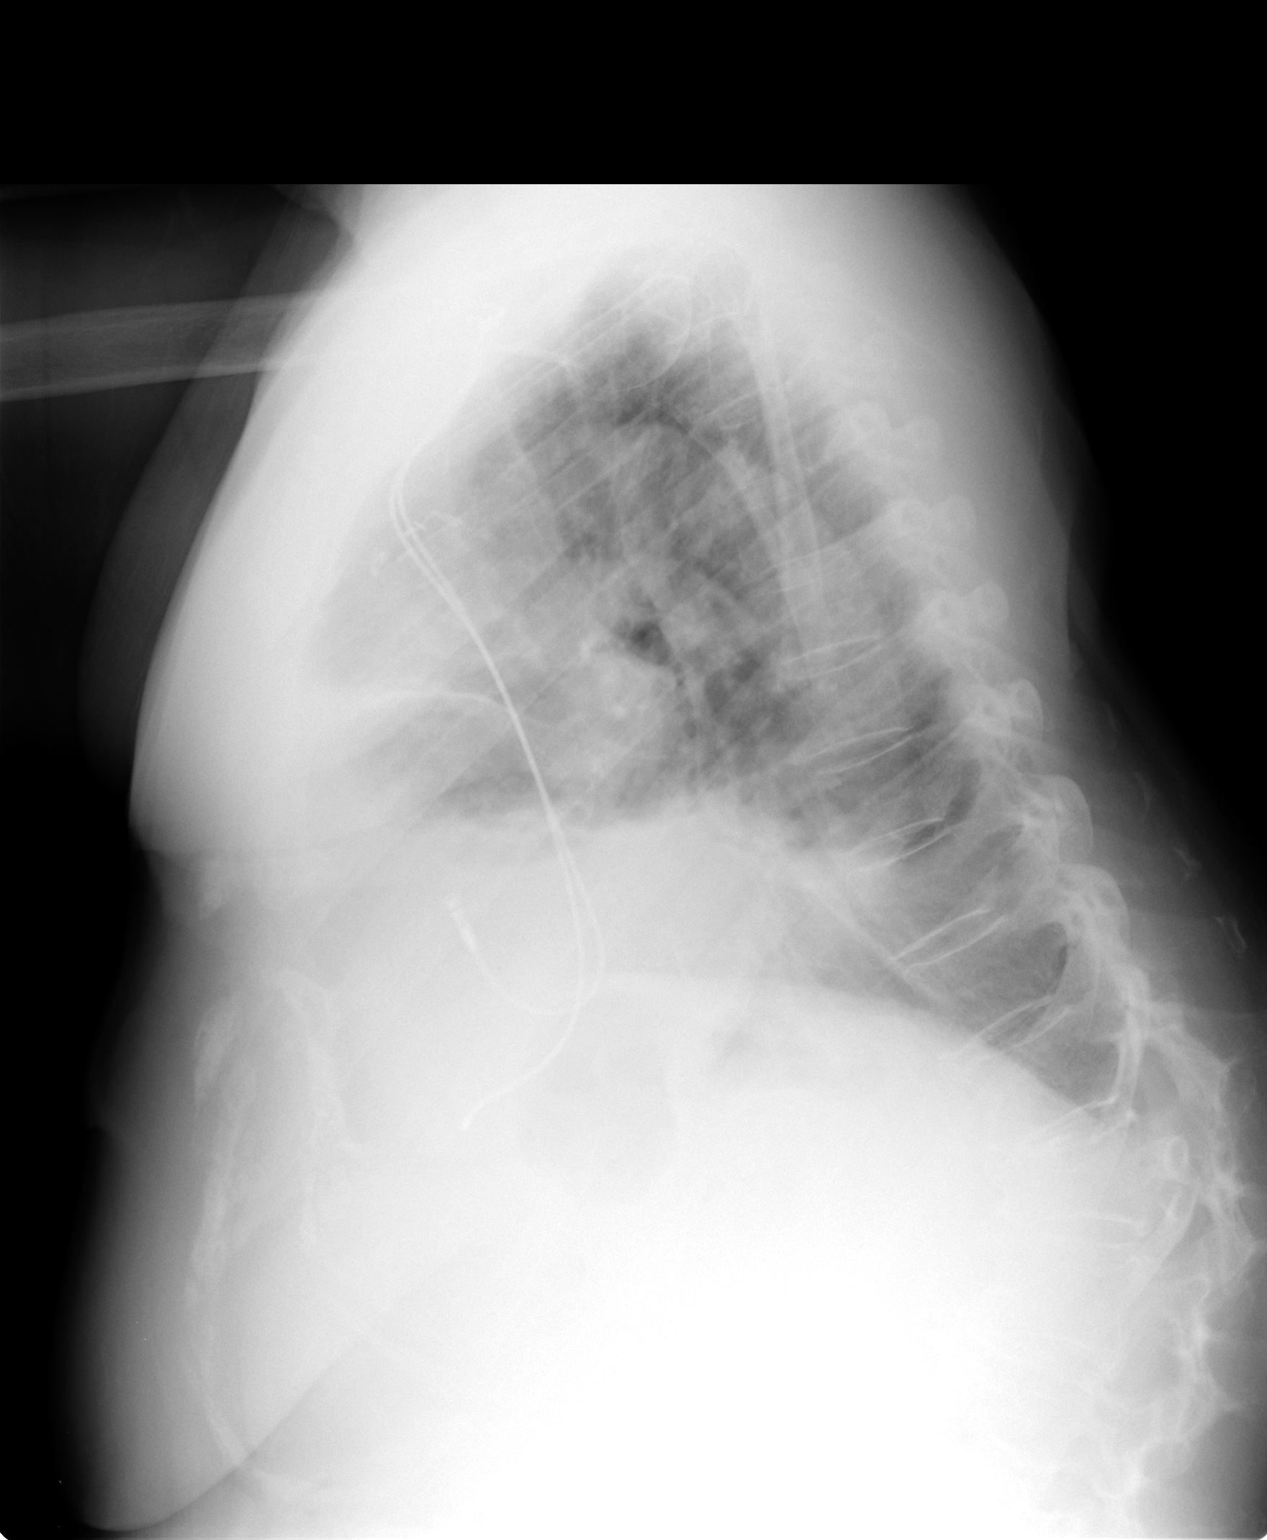

[2 of 2 positions shown; findings below may reference images not displayed]

FINDINGS: Cardiac shadow is stable. A pacing device is again seen. Anincreasing right-sided pleural effusion is noted. Chronic
compression deformities are noted in the thoracic spine.
IMPRESSION: Increasing right-sided pleural effusion.
